# Patient Record
Sex: Female | Born: 1955
Health system: Southern US, Community
[De-identification: ages and names within clinical notes are randomized; demographics above are authoritative.]

## PROBLEM LIST (undated history)

## (undated) DIAGNOSIS — D62 Acute posthemorrhagic anemia: Secondary | ICD-10-CM

## (undated) DIAGNOSIS — E7800 Pure hypercholesterolemia, unspecified: Secondary | ICD-10-CM

## (undated) DIAGNOSIS — I252 Old myocardial infarction: Secondary | ICD-10-CM

## (undated) DIAGNOSIS — I10 Essential (primary) hypertension: Secondary | ICD-10-CM

## (undated) DIAGNOSIS — E8881 Metabolic syndrome: Secondary | ICD-10-CM

## (undated) DIAGNOSIS — M109 Gout, unspecified: Secondary | ICD-10-CM

## (undated) DIAGNOSIS — K219 Gastro-esophageal reflux disease without esophagitis: Secondary | ICD-10-CM

## (undated) DIAGNOSIS — I2510 Atherosclerotic heart disease of native coronary artery without angina pectoris: Secondary | ICD-10-CM

## (undated) DIAGNOSIS — I639 Cerebral infarction, unspecified: Secondary | ICD-10-CM

## (undated) DIAGNOSIS — M1711 Unilateral primary osteoarthritis, right knee: Principal | ICD-10-CM

## (undated) DIAGNOSIS — M1990 Unspecified osteoarthritis, unspecified site: Secondary | ICD-10-CM

## (undated) DIAGNOSIS — R7989 Other specified abnormal findings of blood chemistry: Secondary | ICD-10-CM

## (undated) DIAGNOSIS — I213 ST elevation (STEMI) myocardial infarction of unspecified site: Secondary | ICD-10-CM

## (undated) DIAGNOSIS — E039 Hypothyroidism, unspecified: Secondary | ICD-10-CM

## (undated) DIAGNOSIS — Z96659 Presence of unspecified artificial knee joint: Secondary | ICD-10-CM

## (undated) HISTORY — DX: Metabolic syndrome: E88.81

## (undated) HISTORY — DX: Gastro-esophageal reflux disease without esophagitis: K21.9

## (undated) HISTORY — DX: Acute posthemorrhagic anemia: D62

## (undated) HISTORY — DX: Metabolic syndrome: E88.810

## (undated) HISTORY — DX: Atherosclerotic heart disease of native coronary artery without angina pectoris: I25.10

## (undated) HISTORY — PX: JOINT REPLACEMENT: SHX530

## (undated) HISTORY — DX: Presence of unspecified artificial knee joint: Z96.659

## (undated) HISTORY — PX: SHOULDER ARTHROSCOPY WITH OPEN ROTATOR CUFF REPAIR: SHX6092

## (undated) HISTORY — DX: Other specified abnormal findings of blood chemistry: R79.89

## (undated) HISTORY — PX: LAPAROSCOPIC CHOLECYSTECTOMY: SUR755

## (undated) HISTORY — DX: Unilateral primary osteoarthritis, right knee: M17.11

## (undated) HISTORY — PX: TUBAL LIGATION: SHX77

## (undated) HISTORY — DX: Old myocardial infarction: I25.2

---

## 1998-11-13 ENCOUNTER — Emergency Department (HOSPITAL_COMMUNITY): Admission: EM | Admit: 1998-11-13 | Discharge: 1998-11-13 | Payer: Self-pay | Admitting: Emergency Medicine

## 1999-03-28 ENCOUNTER — Emergency Department (HOSPITAL_COMMUNITY): Admission: EM | Admit: 1999-03-28 | Discharge: 1999-03-28 | Payer: Self-pay | Admitting: Emergency Medicine

## 1999-03-28 ENCOUNTER — Encounter: Payer: Self-pay | Admitting: Emergency Medicine

## 2000-08-23 ENCOUNTER — Encounter: Admission: RE | Admit: 2000-08-23 | Discharge: 2000-08-23 | Payer: Self-pay | Admitting: Internal Medicine

## 2000-08-23 ENCOUNTER — Encounter: Payer: Self-pay | Admitting: Internal Medicine

## 2000-09-27 ENCOUNTER — Other Ambulatory Visit: Admission: RE | Admit: 2000-09-27 | Discharge: 2000-09-27 | Payer: Self-pay | Admitting: Internal Medicine

## 2002-02-18 ENCOUNTER — Other Ambulatory Visit: Admission: RE | Admit: 2002-02-18 | Discharge: 2002-02-18 | Payer: Self-pay | Admitting: Internal Medicine

## 2002-03-18 ENCOUNTER — Emergency Department (HOSPITAL_COMMUNITY): Admission: EM | Admit: 2002-03-18 | Discharge: 2002-03-18 | Payer: Self-pay | Admitting: Emergency Medicine

## 2002-03-18 ENCOUNTER — Encounter: Payer: Self-pay | Admitting: Emergency Medicine

## 2003-03-22 ENCOUNTER — Emergency Department (HOSPITAL_COMMUNITY): Admission: EM | Admit: 2003-03-22 | Discharge: 2003-03-22 | Payer: Self-pay | Admitting: Emergency Medicine

## 2003-03-26 ENCOUNTER — Encounter: Payer: Self-pay | Admitting: Emergency Medicine

## 2003-03-26 ENCOUNTER — Emergency Department (HOSPITAL_COMMUNITY): Admission: EM | Admit: 2003-03-26 | Discharge: 2003-03-26 | Payer: Self-pay | Admitting: Emergency Medicine

## 2004-01-31 ENCOUNTER — Emergency Department (HOSPITAL_COMMUNITY): Admission: EM | Admit: 2004-01-31 | Discharge: 2004-02-01 | Payer: Self-pay | Admitting: Emergency Medicine

## 2004-02-01 IMAGING — CR DG SHOULDER 2+V*L*
3 series · 3 of 3 positions shown · non-contrast
Comparison: none

CLINICAL DATA: Left shoulder pain.  
 LEFT SHOULDER ? [DATE]

[view not recorded (1 of 3)]
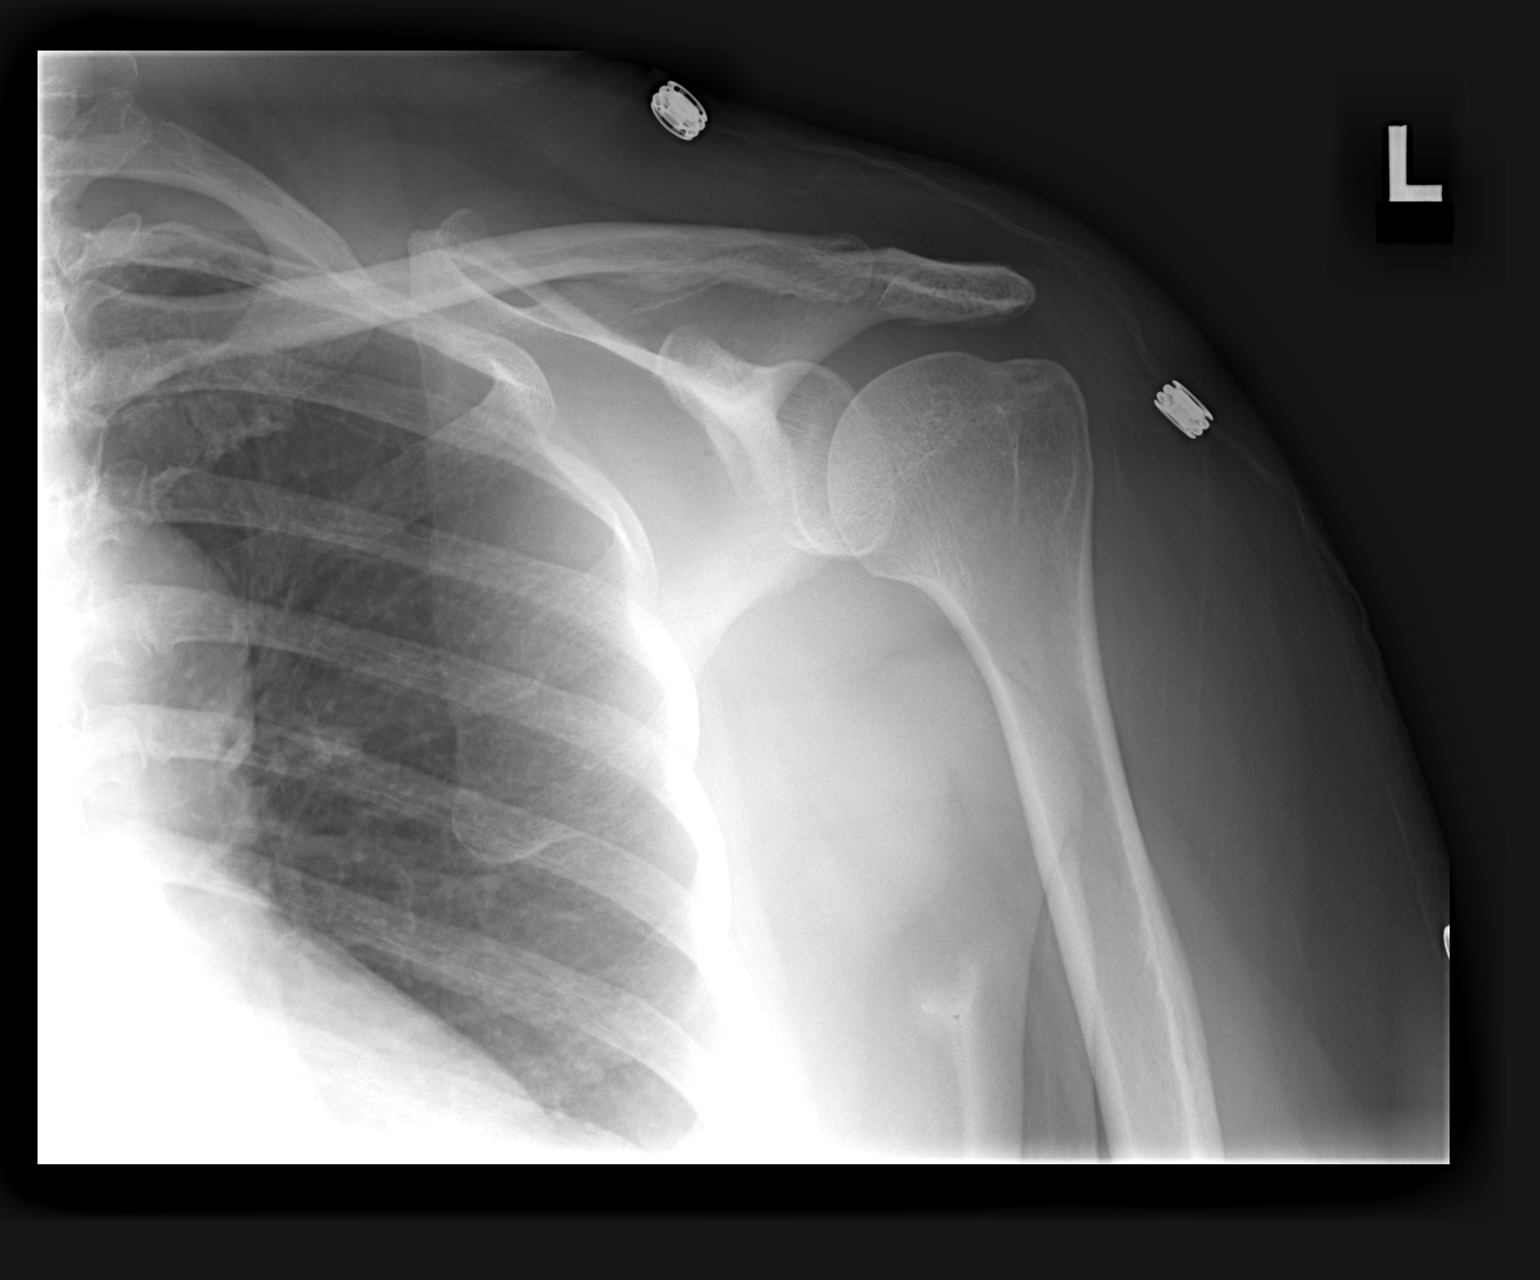

[view not recorded (2 of 3)]
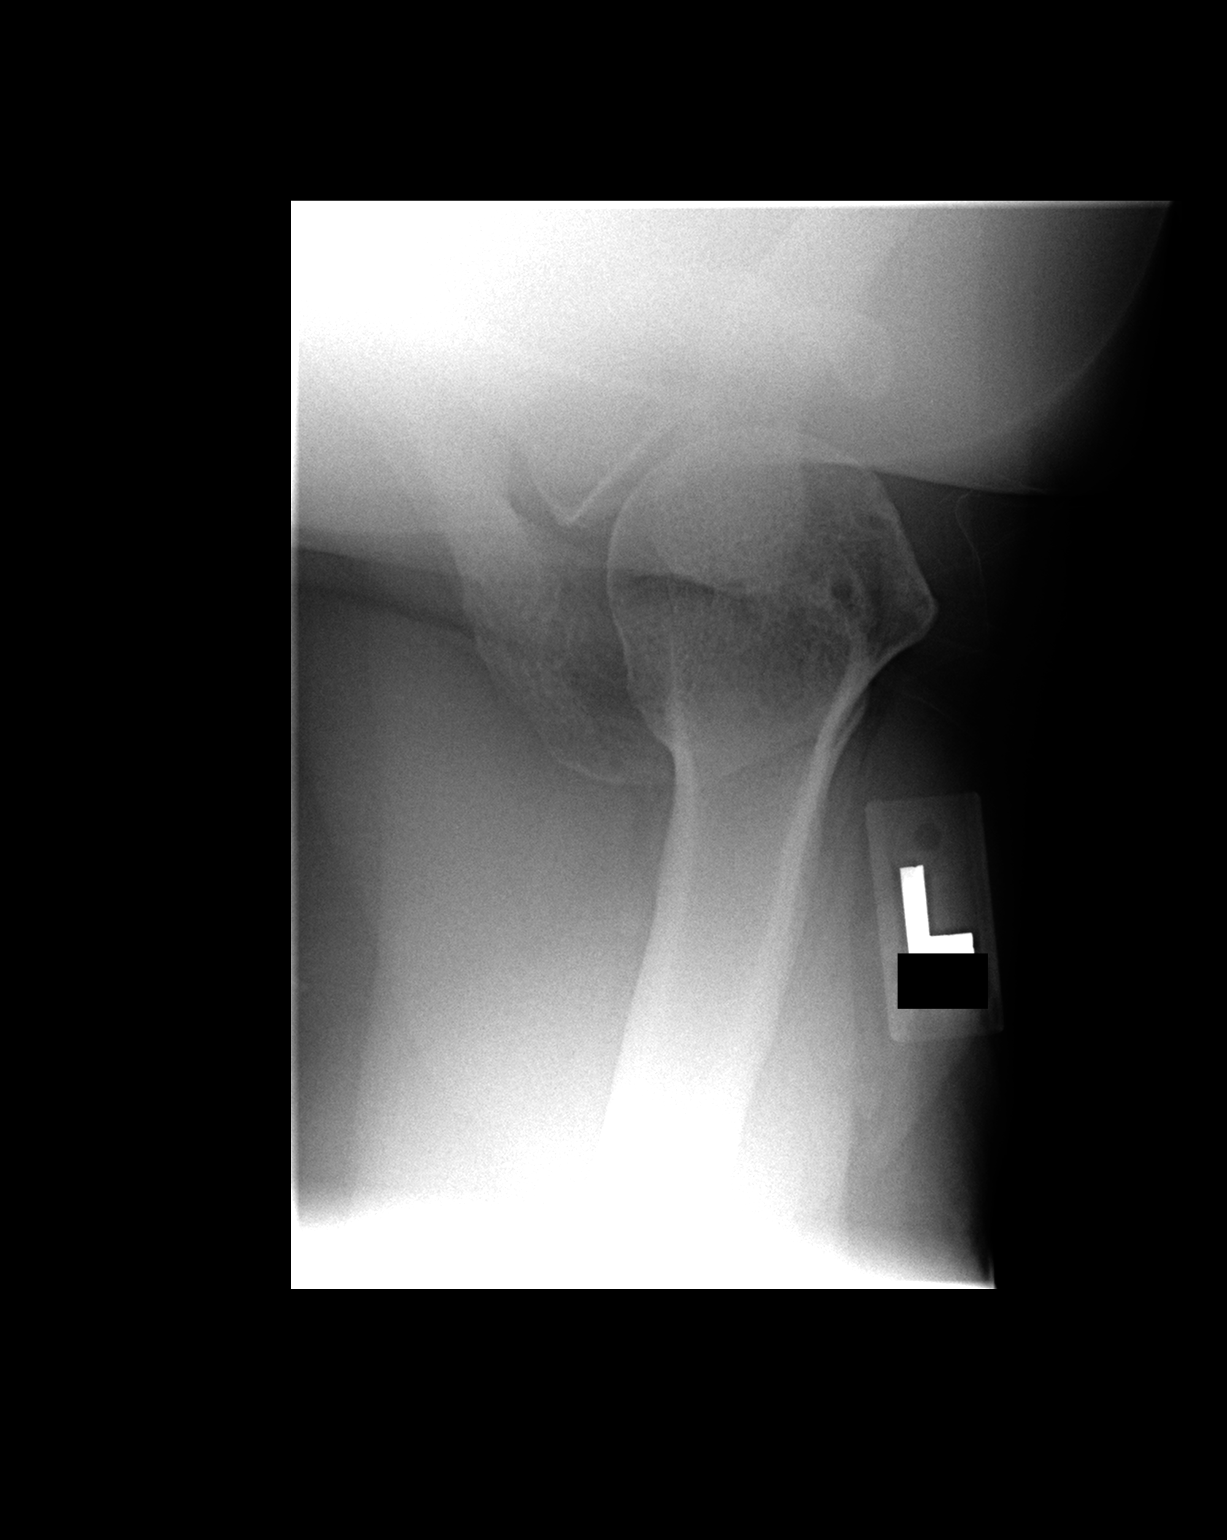

[view not recorded (3 of 3)]
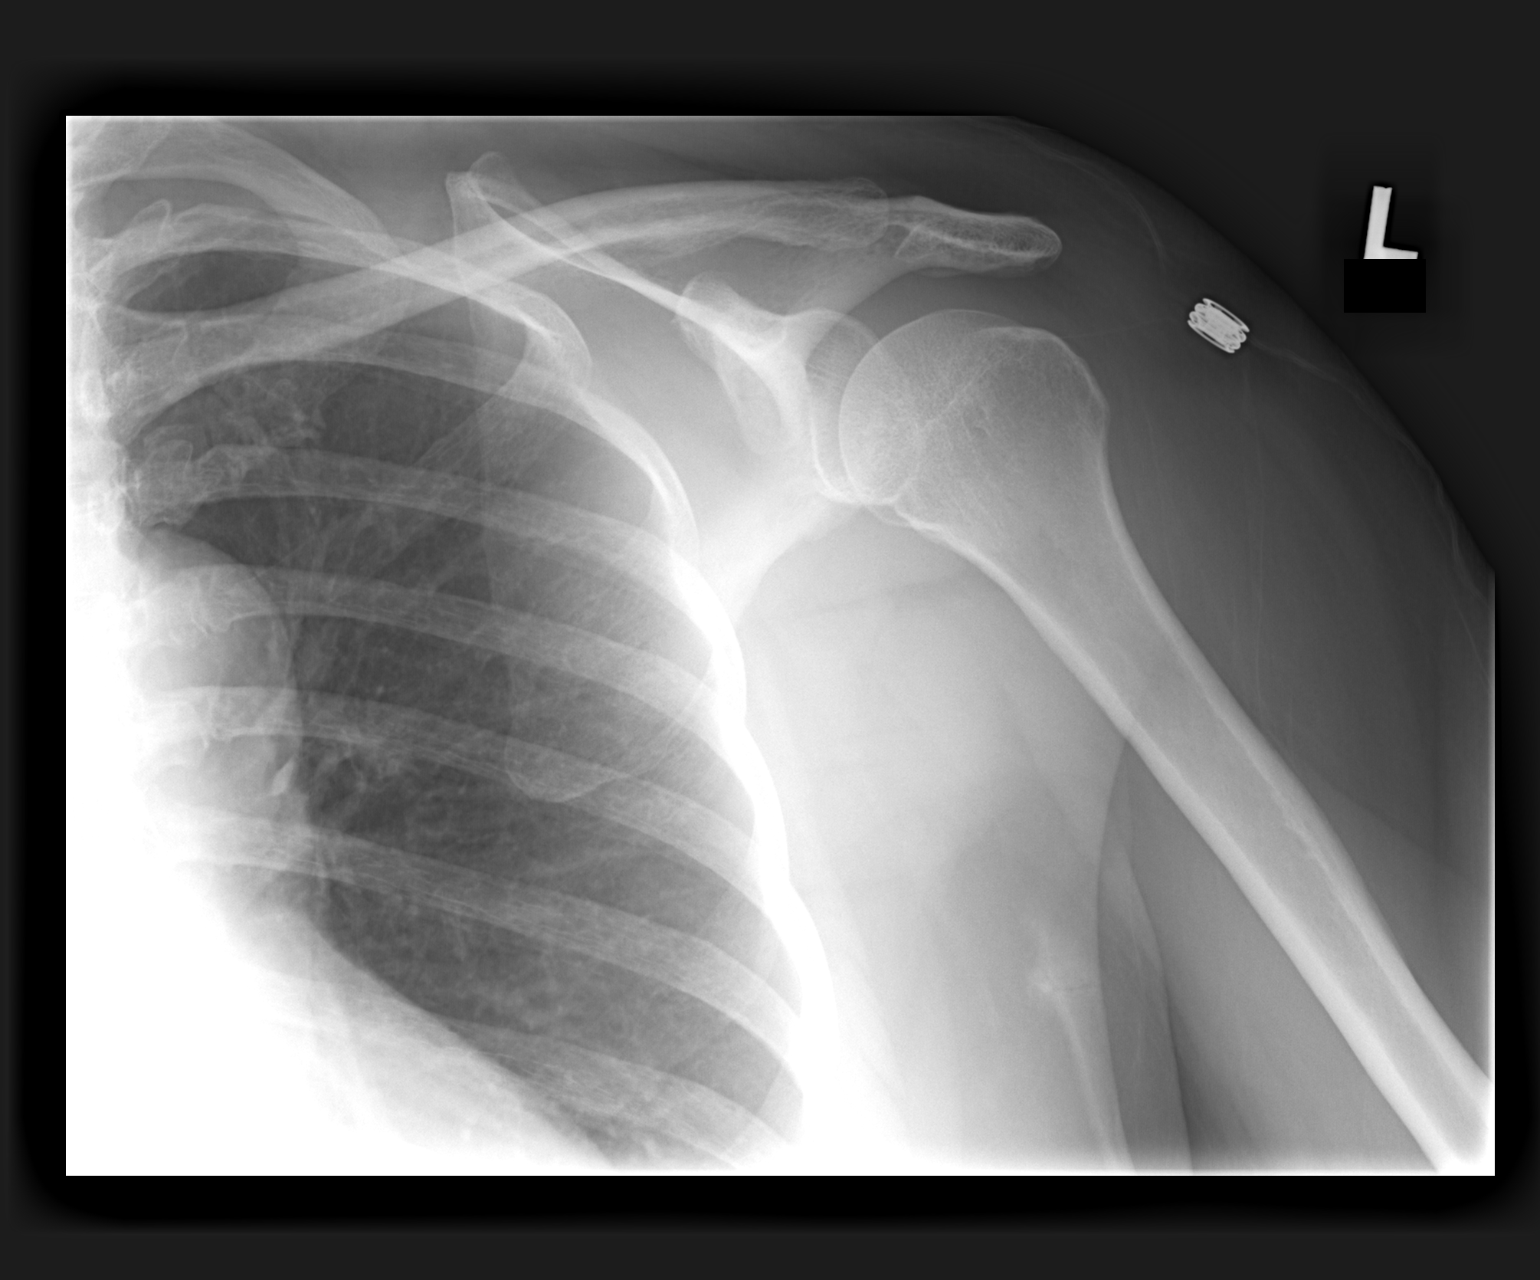

[3 of 3 positions shown; findings below may reference images not displayed]

FINDINGS: There is no evidence of fracture or dislocation. No other significant bone or soft tissue abnormalities are identified.

 IMPRESSION
 Normal study.

## 2004-12-15 ENCOUNTER — Encounter: Admission: RE | Admit: 2004-12-15 | Discharge: 2004-12-15 | Payer: Self-pay | Admitting: Internal Medicine

## 2004-12-15 IMAGING — CR DG LUMBAR SPINE COMPLETE 4+V
5 series · 5 of 5 positions shown · non-contrast
Comparison: none

CLINICAL DATA: Low back, right leg pain.  No known injury.
 DIAGNOSTIC LUMBAR SPINE COMPLETE:

[view not recorded (1 of 5)]
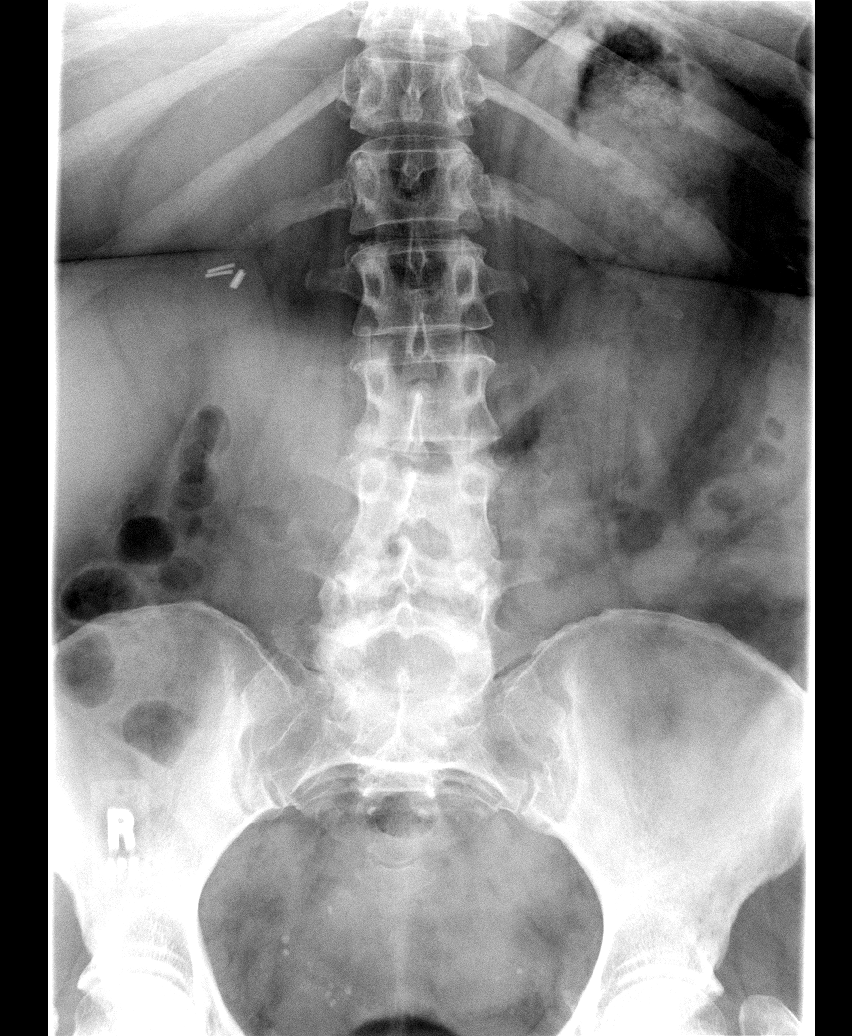

[view not recorded (2 of 5)]
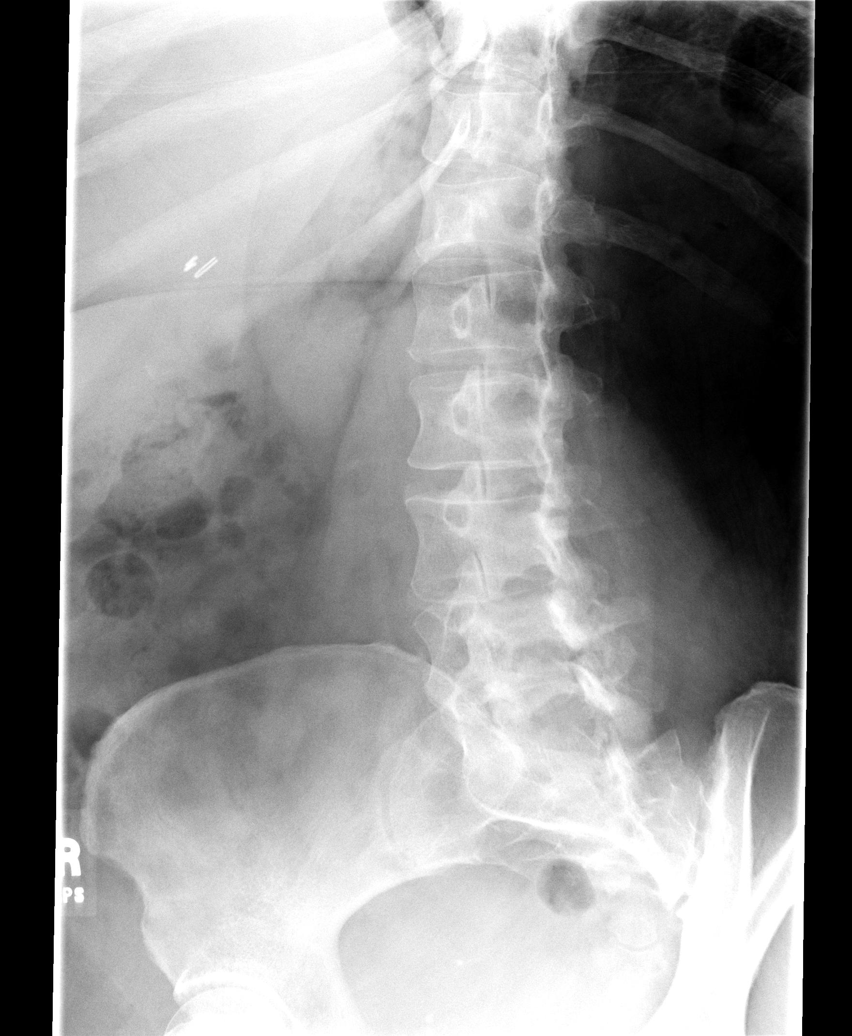

[view not recorded (3 of 5)]
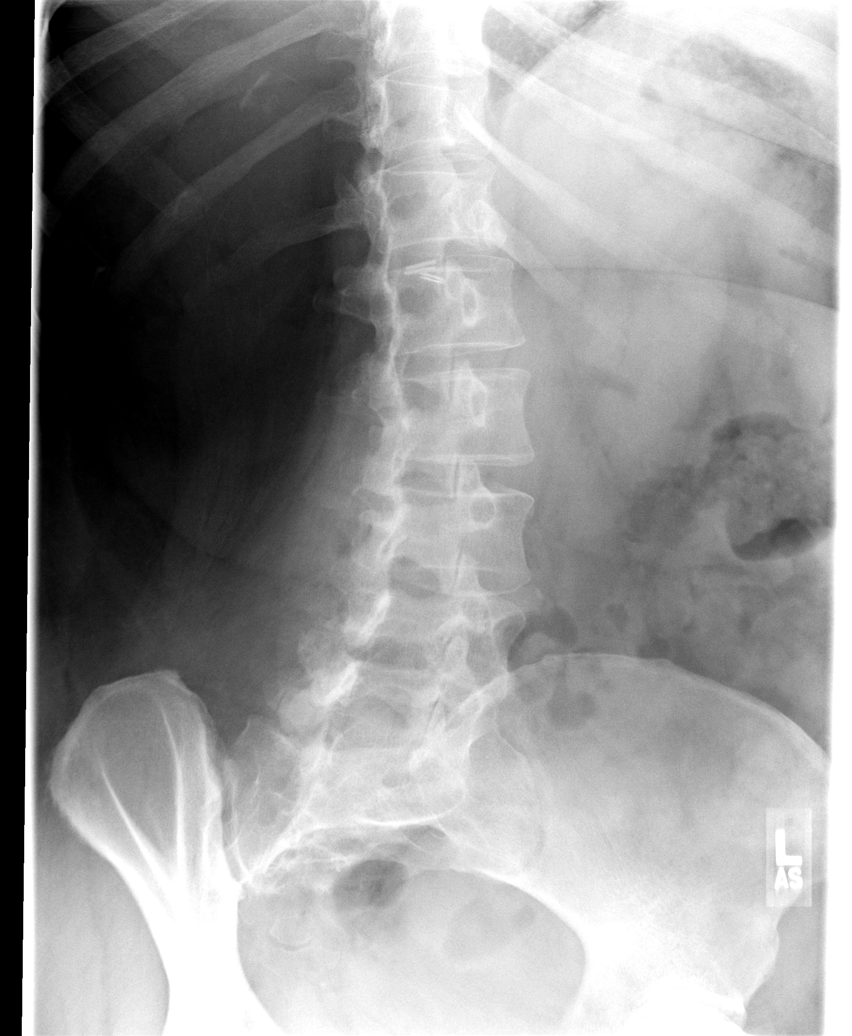

[view not recorded (4 of 5)]
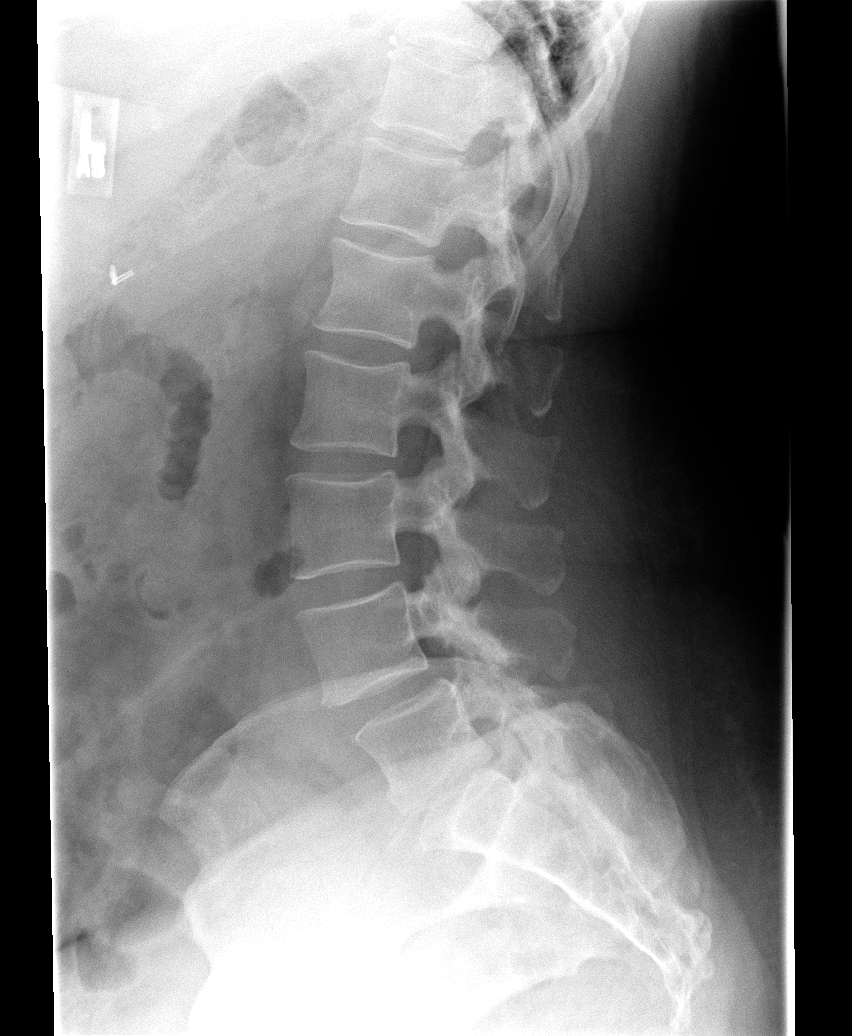

[view not recorded (5 of 5)]
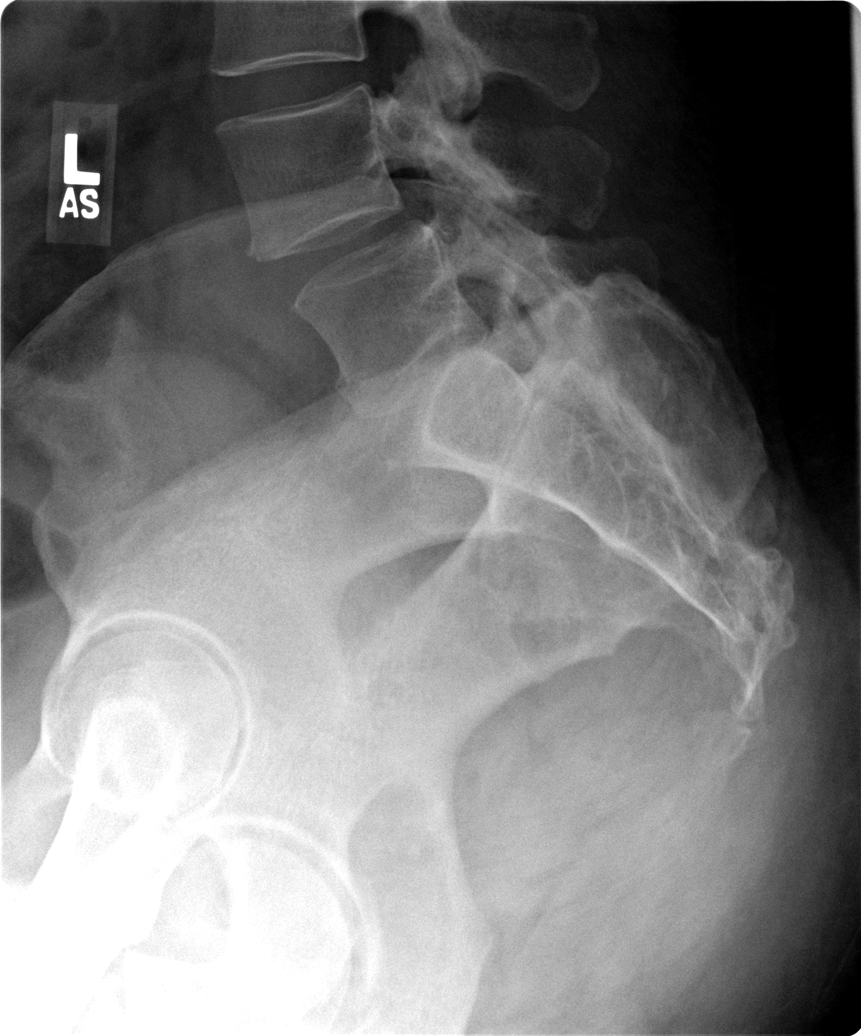

[5 of 5 positions shown; findings below may reference images not displayed]

FINDINGS: Five non-rib bearing lumbar vertebrae are seen with slight levorotary scoliosis lumbar spine.  4 mm minimal degenerative anterolisthesis is seen at L4-5.  Either developmentally smaller and/or degenerative disc space narrowing is seen at L5-S1 with the remaining lumbar disc spaces normally maintained.  Slight degenerative facet change is seen at the right greater than left L4-5 and lesser bilateral L5-S1 facet joints.
IMPRESSION: 1.  Slight levorotary scoliosis.
 2.  4 mm degenerative anterolisthesis L4-5 with right greater than left facet degenerative joint disease L4-5 and lesser facet degenerative changes L5-S1.
 3.  Smaller L5-S1 disc as described. 
 4.  Otherwise negative.

## 2005-10-01 ENCOUNTER — Emergency Department (HOSPITAL_COMMUNITY): Admission: EM | Admit: 2005-10-01 | Discharge: 2005-10-01 | Payer: Self-pay | Admitting: Emergency Medicine

## 2005-10-01 IMAGING — CR DG ABDOMEN ACUTE W/ 1V CHEST
3 series · 3 of 3 positions shown · non-contrast
Comparison: none

CLINICAL DATA: Abdominal pain.  
 ACUTE ABDOMINAL SERIES:

[w chest pa]
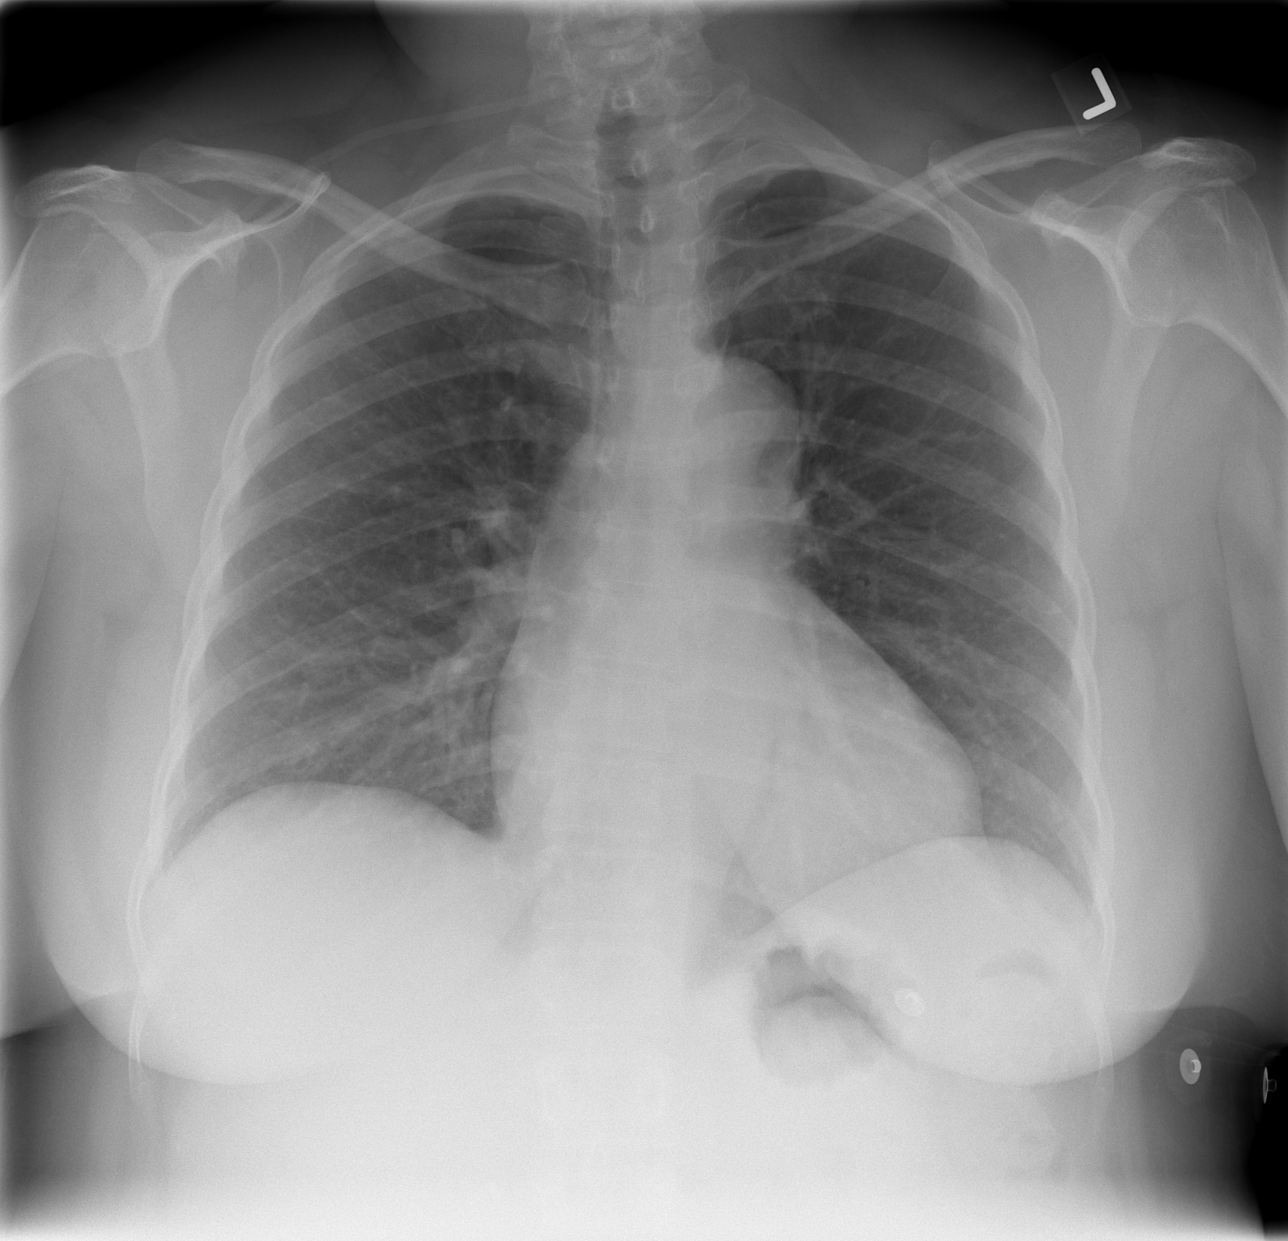

[w abdomen upright *]
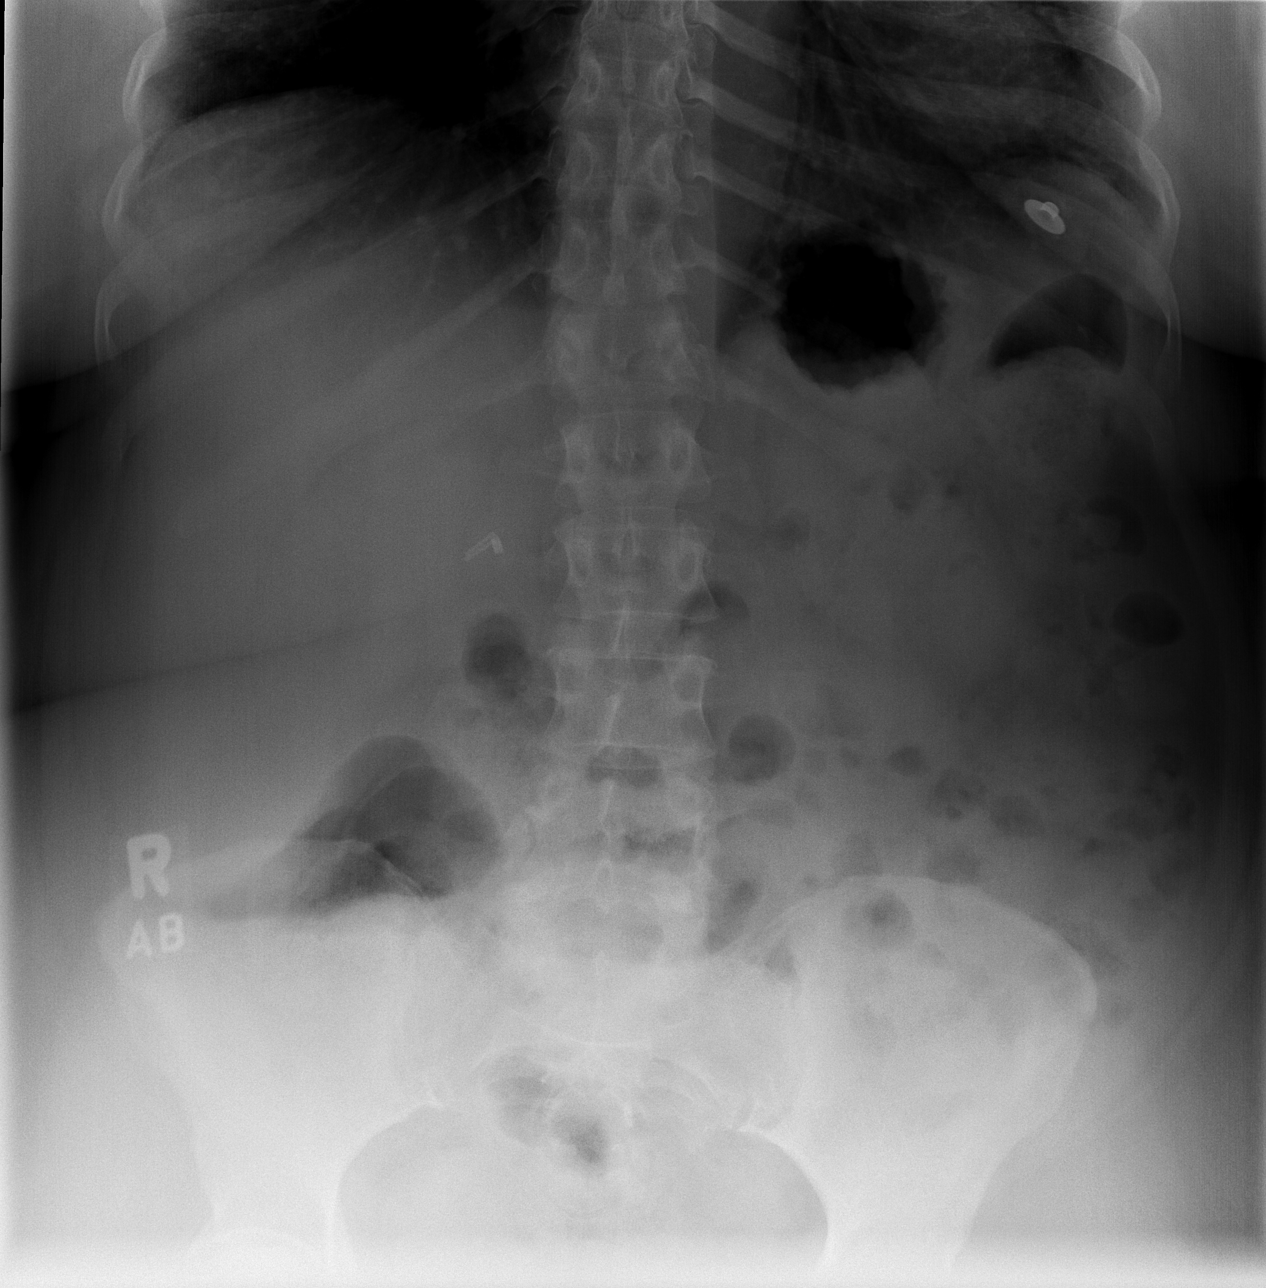

[t abdomen supine]
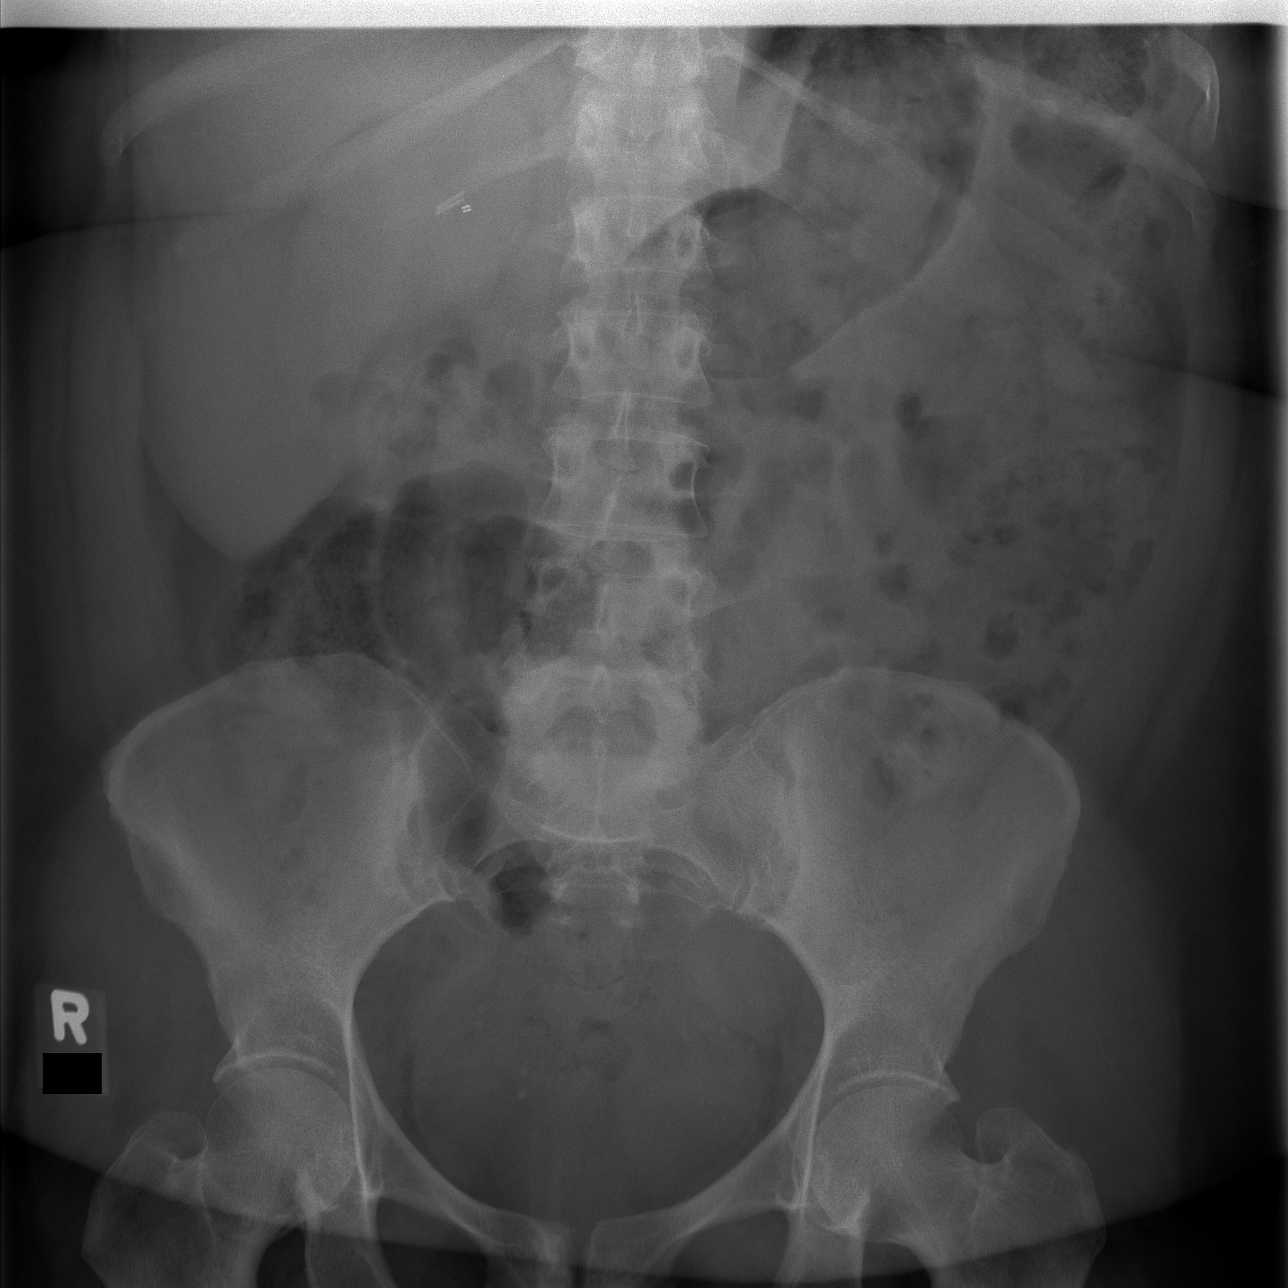

[3 of 3 positions shown; findings below may reference images not displayed]

FINDINGS: The lungs are clear.  Cardiac and mediastinal contours are normal. 
 Nonobstructive bowel gas.  Surgical clips right upper quadrant attributed to prior cholecystectomy.  Calcification within the pelvis attributed to phleboliths.
IMPRESSION: 1.  Negative for acute cardiac or pulmonary process.
 2.  Nonobstructive bowel gas.

## 2005-10-12 ENCOUNTER — Other Ambulatory Visit: Admission: RE | Admit: 2005-10-12 | Discharge: 2005-10-12 | Payer: Self-pay | Admitting: Obstetrics and Gynecology

## 2005-12-29 ENCOUNTER — Ambulatory Visit (HOSPITAL_COMMUNITY): Admission: RE | Admit: 2005-12-29 | Discharge: 2005-12-29 | Payer: Self-pay | Admitting: Obstetrics and Gynecology

## 2007-08-16 ENCOUNTER — Emergency Department (HOSPITAL_COMMUNITY): Admission: EM | Admit: 2007-08-16 | Discharge: 2007-08-16 | Payer: Self-pay | Admitting: Emergency Medicine

## 2007-08-16 ENCOUNTER — Ambulatory Visit: Payer: Self-pay | Admitting: Vascular Surgery

## 2008-10-19 ENCOUNTER — Emergency Department (HOSPITAL_COMMUNITY): Admission: EM | Admit: 2008-10-19 | Discharge: 2008-10-19 | Payer: Self-pay | Admitting: Emergency Medicine

## 2008-10-19 IMAGING — CR DG HIP COMPLETE 2+V*R*
3 series · 3 of 3 positions shown · non-contrast
Comparison: None available

CLINICAL DATA: Right leg pain.

RIGHT HIP - COMPLETE 2+ VIEW

[t pelvis a.p.]
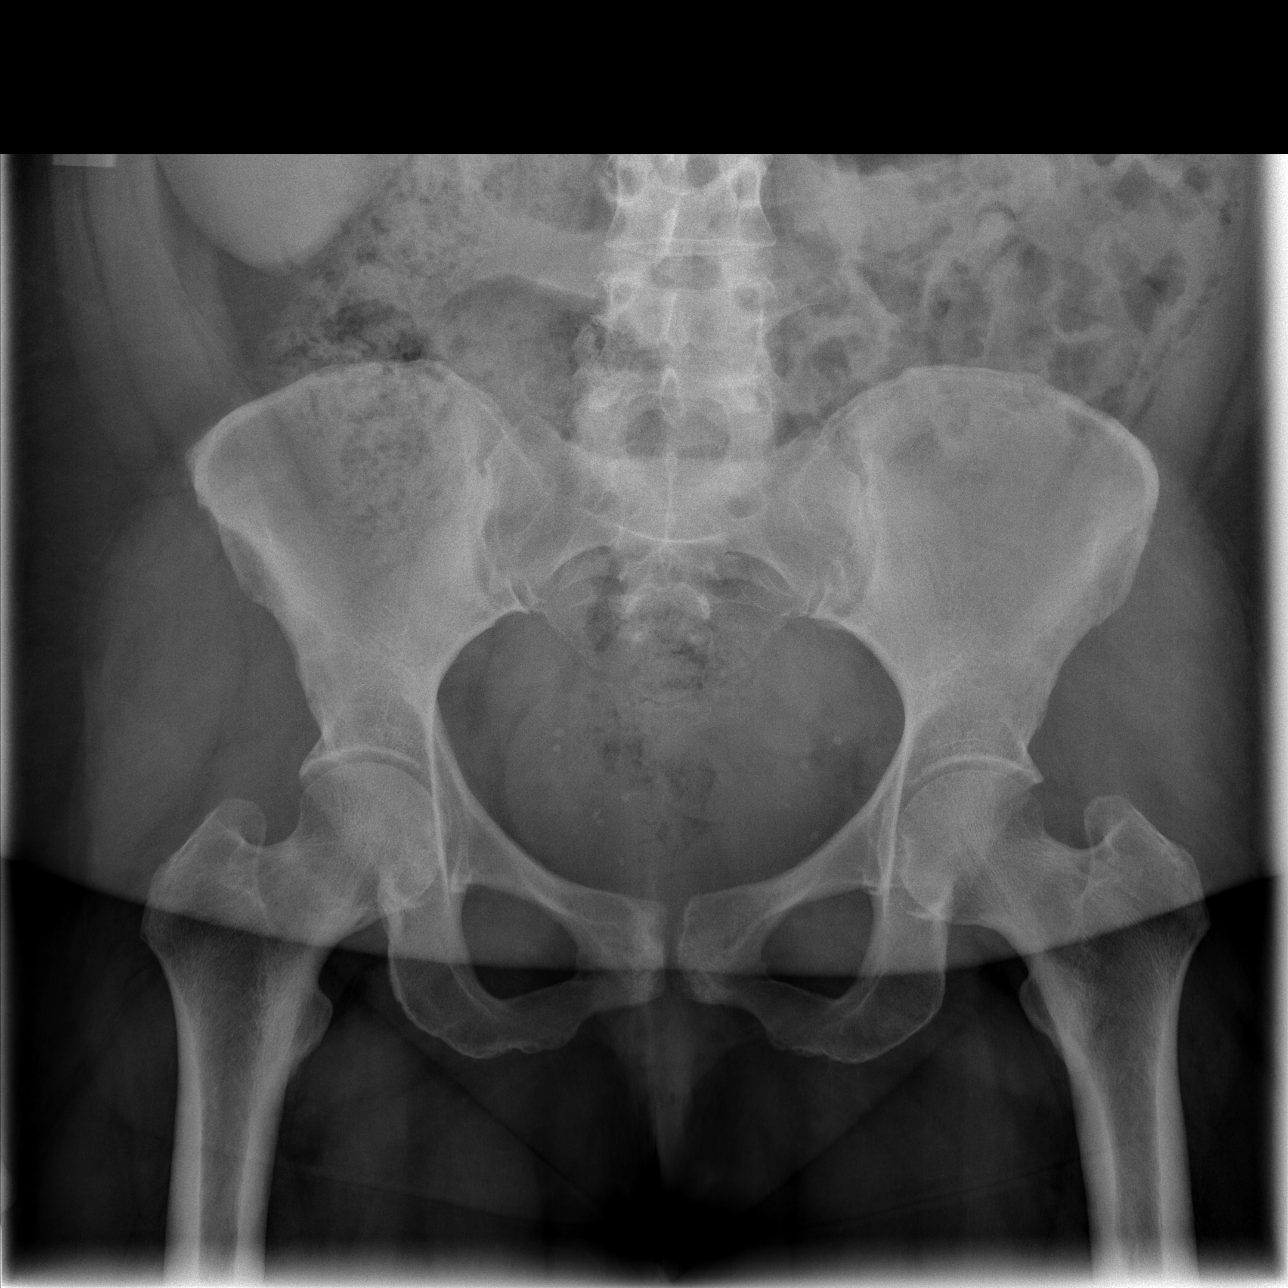

[t hip ap right]
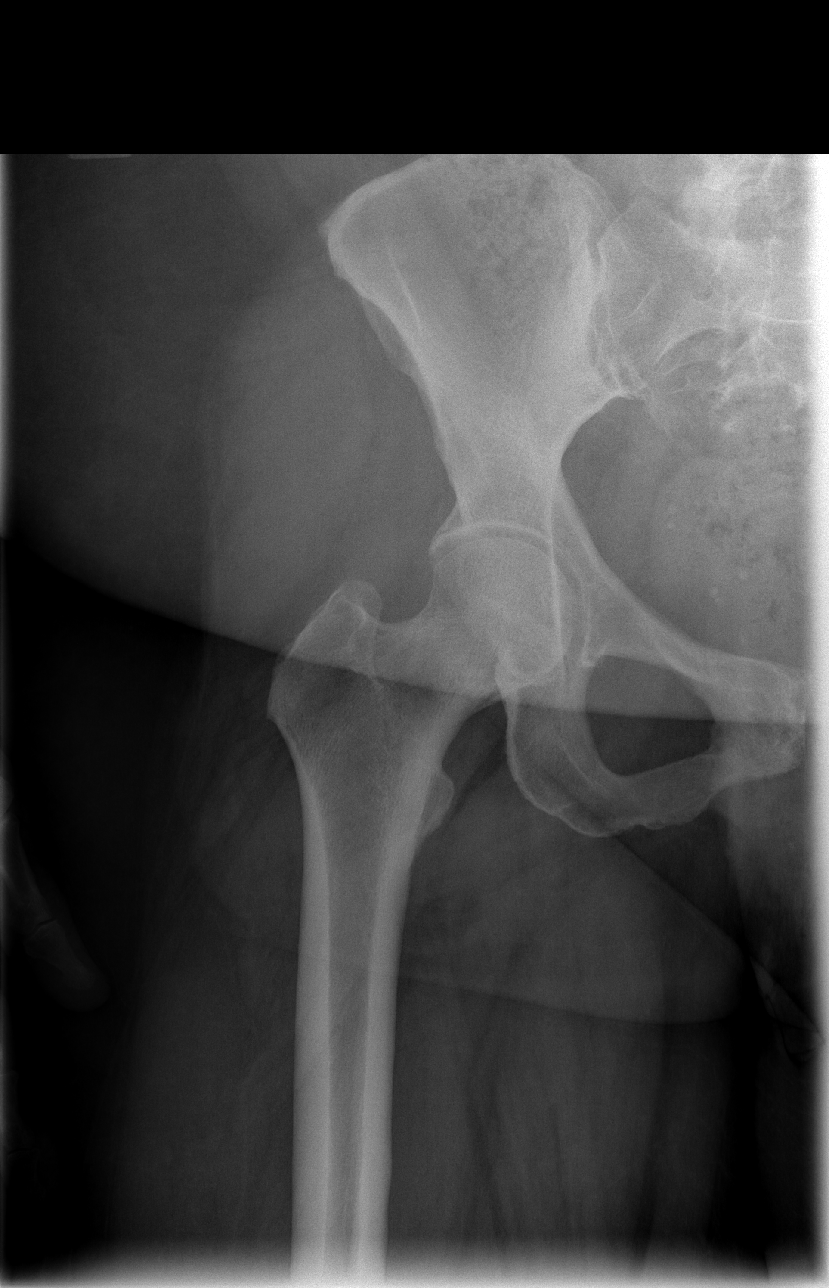

[t hip frog leg right]
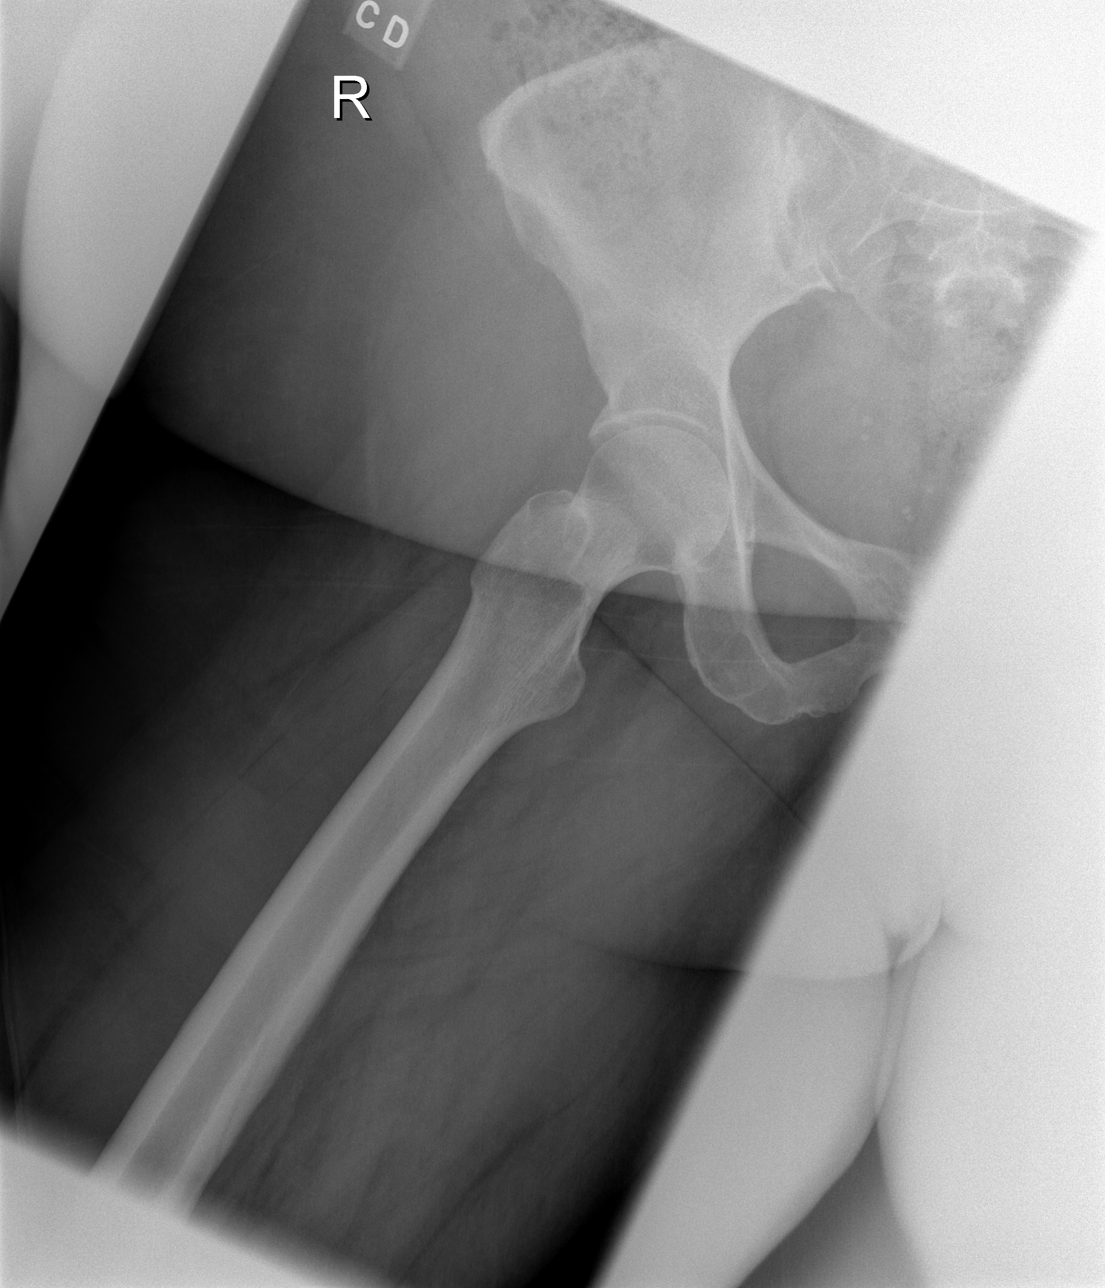

[3 of 3 positions shown; findings below may reference images not displayed]

FINDINGS: Hip joint spaces appear symmetric bilaterally.  No
fracture is present.  Calcified phleboliths present in the anatomic
pelvis.  Lateral view of the right hip unremarkable.
IMPRESSION: No acute osseous abnormality.  If the the patient continues to have
hip pain, consider MRI.

## 2010-12-24 ENCOUNTER — Encounter: Payer: Self-pay | Admitting: Obstetrics and Gynecology

## 2011-05-06 ENCOUNTER — Emergency Department (HOSPITAL_COMMUNITY)
Admission: EM | Admit: 2011-05-06 | Discharge: 2011-05-06 | Disposition: A | Discharge status: Home / Self Care | Payer: Self-pay | Attending: Emergency Medicine | Admitting: Emergency Medicine

## 2011-05-06 DIAGNOSIS — E039 Hypothyroidism, unspecified: Secondary | ICD-10-CM | POA: Insufficient documentation

## 2011-05-06 DIAGNOSIS — L0211 Cutaneous abscess of neck: Secondary | ICD-10-CM | POA: Insufficient documentation

## 2011-05-06 DIAGNOSIS — R21 Rash and other nonspecific skin eruption: Secondary | ICD-10-CM | POA: Insufficient documentation

## 2011-05-06 DIAGNOSIS — L259 Unspecified contact dermatitis, unspecified cause: Secondary | ICD-10-CM | POA: Insufficient documentation

## 2011-05-06 DIAGNOSIS — IMO0002 Reserved for concepts with insufficient information to code with codable children: Secondary | ICD-10-CM | POA: Insufficient documentation

## 2011-09-15 LAB — POCT I-STAT CREATININE: Creatinine, Ser: 0.9

## 2011-09-15 LAB — I-STAT 8, (EC8 V) (CONVERTED LAB)
Acid-Base Excess: 2
Chloride: 105
HCT: 39
Operator id: 288331
Potassium: 3.5
Sodium: 139

## 2011-09-15 LAB — DIFFERENTIAL
Basophils Relative: 1
Eosinophils Absolute: 0.1
Eosinophils Relative: 2
Monocytes Absolute: 0.3
Monocytes Relative: 5

## 2011-09-15 LAB — CBC
Hemoglobin: 11.7 — ABNORMAL LOW
MCHC: 33.8
MCV: 98.1
RBC: 3.54 — ABNORMAL LOW

## 2013-06-13 ENCOUNTER — Other Ambulatory Visit (INDEPENDENT_AMBULATORY_CARE_PROVIDER_SITE_OTHER): Payer: Self-pay | Admitting: General Surgery

## 2013-06-13 DIAGNOSIS — C50911 Malignant neoplasm of unspecified site of right female breast: Secondary | ICD-10-CM

## 2013-06-13 NOTE — Addendum Note (Signed)
Addended by: Milas Hock on: 06/13/2013 11:23 AM   Modules accepted: Orders

## 2013-09-01 ENCOUNTER — Encounter (INDEPENDENT_AMBULATORY_CARE_PROVIDER_SITE_OTHER): Payer: Self-pay

## 2013-11-18 ENCOUNTER — Ambulatory Visit: Payer: Self-pay | Admitting: Family Medicine

## 2013-11-25 ENCOUNTER — Ambulatory Visit: Payer: Self-pay | Admitting: Family Medicine

## 2014-05-29 ENCOUNTER — Encounter (HOSPITAL_COMMUNITY): Payer: Self-pay | Admitting: Emergency Medicine

## 2014-05-29 ENCOUNTER — Emergency Department (HOSPITAL_COMMUNITY)
Admission: EM | Admit: 2014-05-29 | Discharge: 2014-05-29 | Disposition: A | Discharge status: Home / Self Care | Payer: No Typology Code available for payment source | Attending: Emergency Medicine | Admitting: Emergency Medicine

## 2014-05-29 ENCOUNTER — Emergency Department (HOSPITAL_COMMUNITY): Payer: No Typology Code available for payment source

## 2014-05-29 DIAGNOSIS — R079 Chest pain, unspecified: Secondary | ICD-10-CM | POA: Insufficient documentation

## 2014-05-29 DIAGNOSIS — Z791 Long term (current) use of non-steroidal anti-inflammatories (NSAID): Secondary | ICD-10-CM | POA: Insufficient documentation

## 2014-05-29 DIAGNOSIS — H109 Unspecified conjunctivitis: Secondary | ICD-10-CM | POA: Insufficient documentation

## 2014-05-29 DIAGNOSIS — I10 Essential (primary) hypertension: Secondary | ICD-10-CM | POA: Insufficient documentation

## 2014-05-29 DIAGNOSIS — Z8673 Personal history of transient ischemic attack (TIA), and cerebral infarction without residual deficits: Secondary | ICD-10-CM | POA: Insufficient documentation

## 2014-05-29 DIAGNOSIS — E663 Overweight: Secondary | ICD-10-CM | POA: Insufficient documentation

## 2014-05-29 HISTORY — DX: Cerebral infarction, unspecified: I63.9

## 2014-05-29 HISTORY — DX: Essential (primary) hypertension: I10

## 2014-05-29 LAB — COMPREHENSIVE METABOLIC PANEL
ALBUMIN: 3.9 g/dL (ref 3.5–5.2)
ALK PHOS: 84 U/L (ref 39–117)
ALT: 19 U/L (ref 0–35)
AST: 41 U/L — ABNORMAL HIGH (ref 0–37)
BILIRUBIN TOTAL: 0.2 mg/dL — AB (ref 0.3–1.2)
BUN: 19 mg/dL (ref 6–23)
CHLORIDE: 98 meq/L (ref 96–112)
CO2: 28 meq/L (ref 19–32)
Calcium: 9.7 mg/dL (ref 8.4–10.5)
Creatinine, Ser: 1.17 mg/dL — ABNORMAL HIGH (ref 0.50–1.10)
GFR calc Af Amer: 57 mL/min — ABNORMAL LOW (ref >90)
GFR, EST NON AFRICAN AMERICAN: 50 mL/min — AB (ref >90)
GLUCOSE: 96 mg/dL (ref 70–99)
POTASSIUM: 3.8 meq/L (ref 3.7–5.3)
Sodium: 140 mEq/L (ref 137–147)
Total Protein: 7.8 g/dL (ref 6.0–8.3)

## 2014-05-29 LAB — URINALYSIS, ROUTINE W REFLEX MICROSCOPIC
BILIRUBIN URINE: NEGATIVE
Glucose, UA: NEGATIVE mg/dL
KETONES UR: NEGATIVE mg/dL
NITRITE: NEGATIVE
Protein, ur: NEGATIVE mg/dL
SPECIFIC GRAVITY, URINE: 1.017 (ref 1.005–1.030)
UROBILINOGEN UA: 0.2 mg/dL (ref 0.0–1.0)
pH: 5.5 (ref 5.0–8.0)

## 2014-05-29 LAB — CBC
HCT: 33.7 % — ABNORMAL LOW (ref 36.0–46.0)
Hemoglobin: 11 g/dL — ABNORMAL LOW (ref 12.0–15.0)
MCH: 32.7 pg (ref 26.0–34.0)
MCHC: 32.6 g/dL (ref 30.0–36.0)
MCV: 100.3 fL — AB (ref 78.0–100.0)
PLATELETS: 310 10*3/uL (ref 150–400)
RBC: 3.36 MIL/uL — ABNORMAL LOW (ref 3.87–5.11)
RDW: 14.6 % (ref 11.5–15.5)
WBC: 4 10*3/uL (ref 4.0–10.5)

## 2014-05-29 LAB — URINE MICROSCOPIC-ADD ON

## 2014-05-29 LAB — I-STAT TROPONIN, ED: Troponin i, poc: 0.02 ng/mL (ref 0.00–0.08)

## 2014-05-29 IMAGING — CR DG CHEST 2V
2 series · 2 of 2 positions shown · non-contrast
Comparison: None.

CLINICAL DATA: Substernal chest pain radiating to the back.
Shortness of breath with exertion.

EXAM:
CHEST  2 VIEW

[w chest pa]
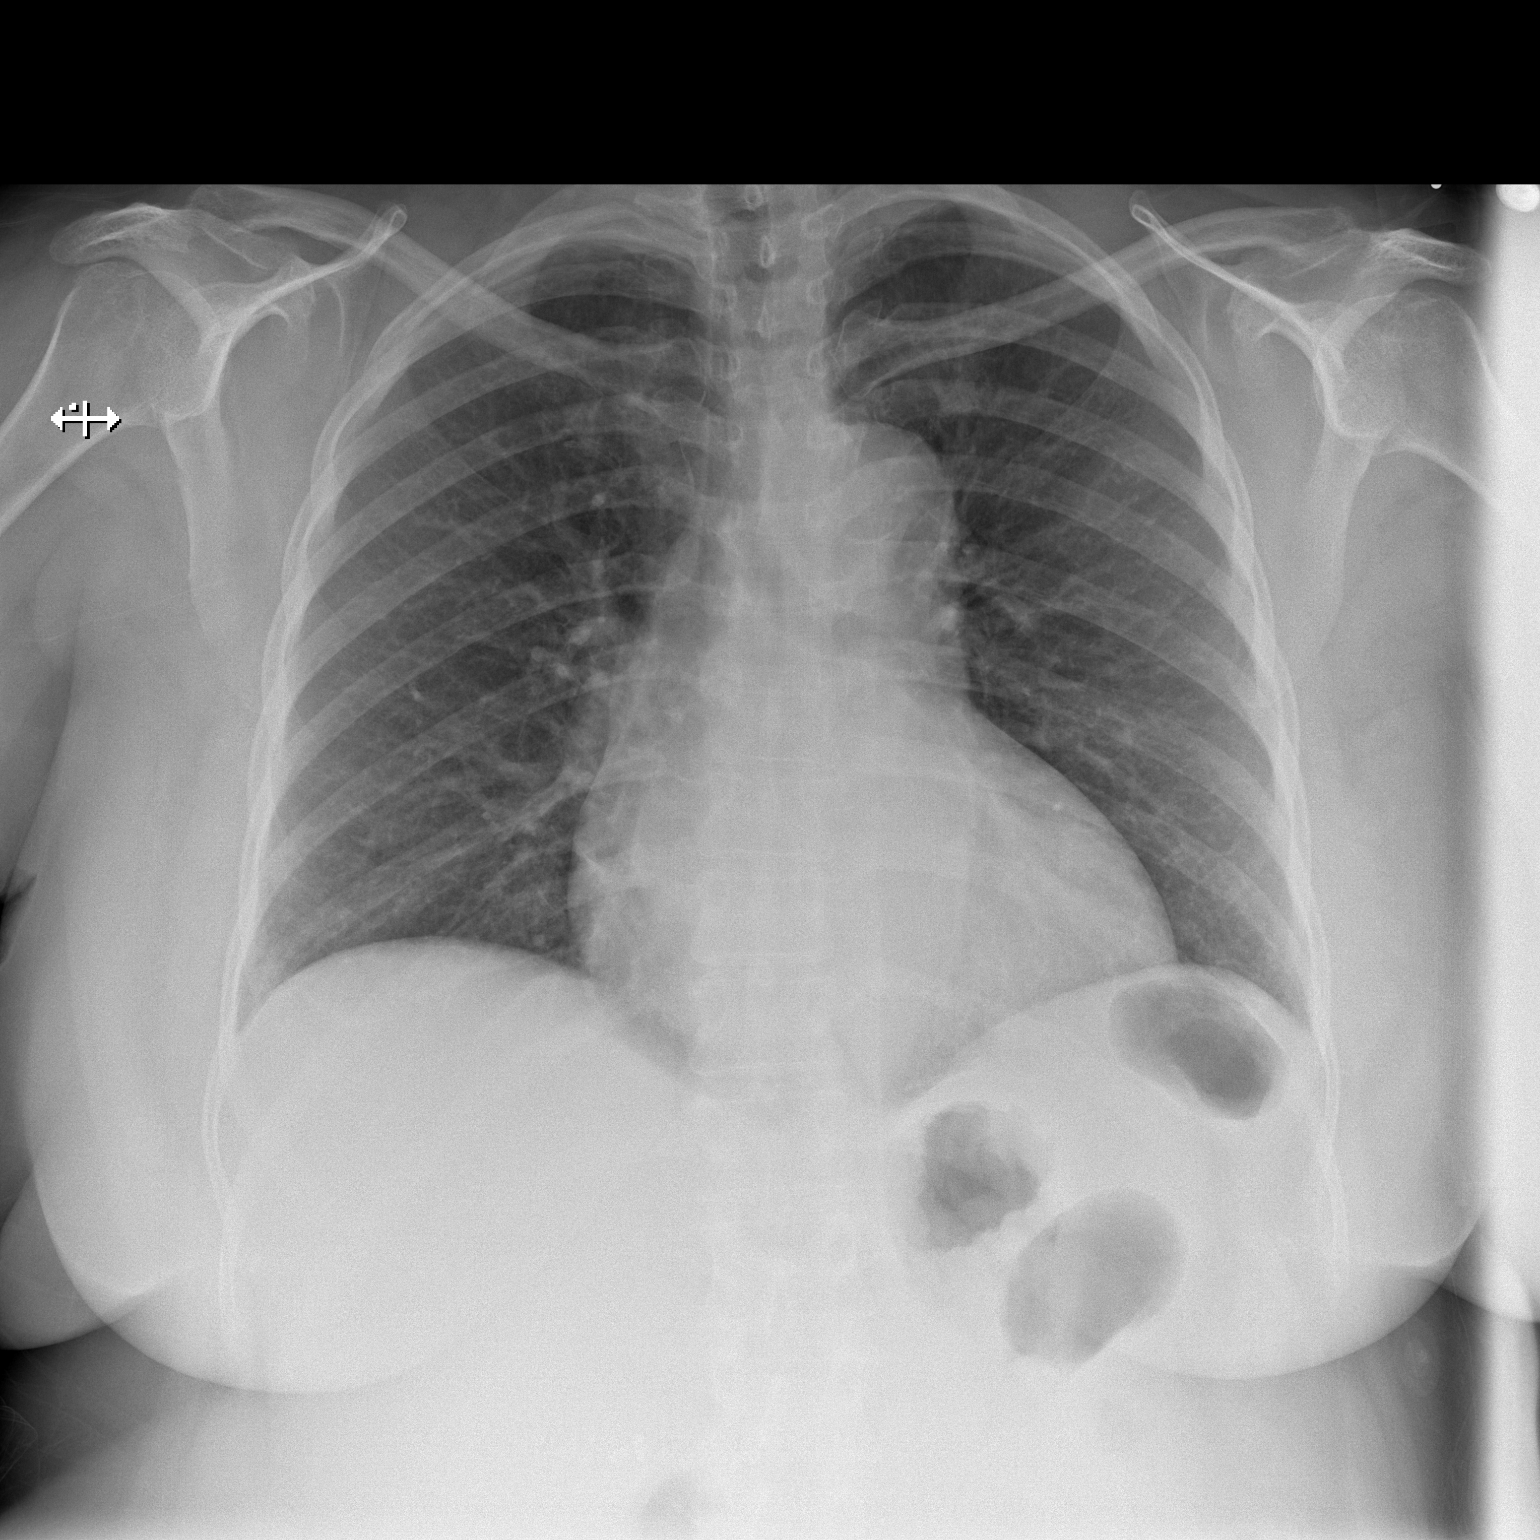

[w chest lat]
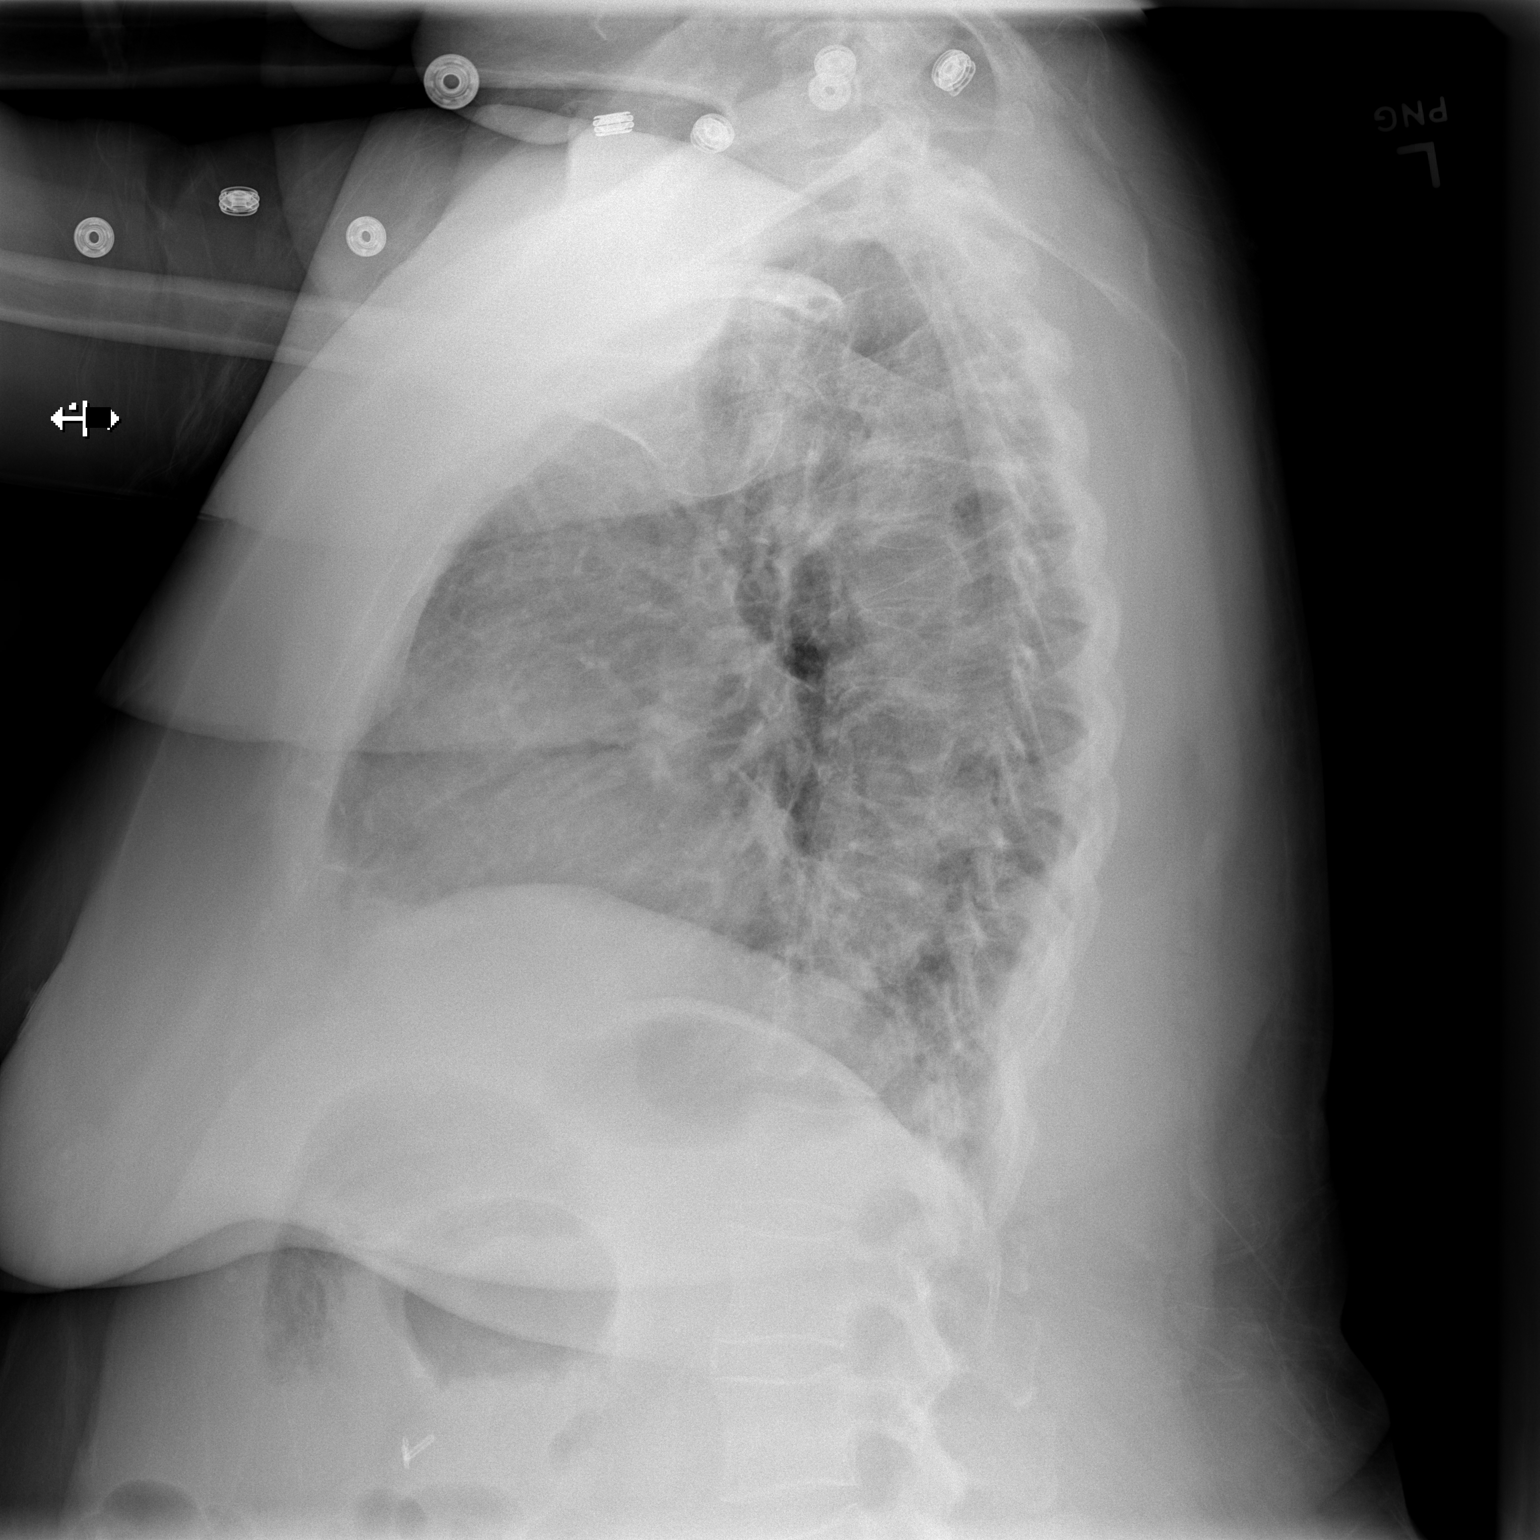

[2 of 2 positions shown; findings below may reference images not displayed]

FINDINGS: Trachea is midline. Heart is at the upper limits of normal in size
to mildly enlarged. Mild biapical pleural thickening. Lungs are
clear. No pleural fluid.
IMPRESSION: No acute findings.

## 2014-05-29 MED ORDER — GI COCKTAIL ~~LOC~~
30.0000 mL | Freq: Once | ORAL | Status: AC
  Administered 2014-05-29: 30 mL via ORAL
  Filled 2014-05-29: qty 30

## 2014-05-29 MED ORDER — TOBRAMYCIN 0.3 % OP SOLN: 2.0000 [drp] | OPHTHALMIC | Status: DC

## 2014-05-29 MED ORDER — OMEPRAZOLE 40 MG PO CPDR: 40.0000 mg | DELAYED_RELEASE_CAPSULE | Freq: Every day | ORAL | Status: DC

## 2014-05-29 NOTE — ED Notes (Signed)
Pt reports pain is returning. Primary nurse updated.

## 2014-05-29 NOTE — ED Provider Notes (Signed)
CSN: [id number]     Arrival date & time 05/29/14  1458 History   First MD Initiated Contact with Patient 05/29/14 1813     Chief Complaint  Patient presents with  . Chest Pain     (Consider location/radiation/quality/duration/timing/severity/associated sxs/prior Treatment) Patient is a 58 y.o. female presenting with chest pain. The history is provided by the patient.  Chest Pain  She complains of persistent, chest pain, for 3 days. It is present, every day, all day. She has had a mild, nonproductive cough. She denies fever, chills, weakness, or dizziness. She has a chronically draining left eye, for 2 years. She states that she has never had a stroke. She states that she has had hypothyroidism but is out of her medications. She now has new insurance, so she will attempt to see a doctor soon. There are no other known modifying factors.  Past Medical History  Diagnosis Date  . Stroke   . Hypertension    History reviewed. No pertinent past surgical history. No family history on file. History  Substance Use Topics  . Smoking status: Never Smoker   . Smokeless tobacco: Not on file  . Alcohol Use: No   OB History   Grav Para Term Preterm Abortions TAB SAB Ect Mult Living                 Review of Systems  Cardiovascular: Positive for chest pain.  All other systems reviewed and are negative.     Allergies  Review of patient's allergies indicates no known allergies.  Home Medications   Prior to Admission medications   Medication Sig Start Date End Date Taking? Authorizing Provider  ibuprofen (ADVIL,MOTRIN) 200 MG tablet Take 200 mg by mouth every 6 (six) hours as needed.   Yes Historical Provider, MD  omeprazole (PRILOSEC) 40 MG capsule Take 1 capsule (40 mg total) by mouth daily. 05/29/14   Richarda Blade, MD  tobramycin (TOBREX) 0.3 % ophthalmic solution Place 2 drops into the left eye every 2 (two) hours. 05/29/14   Richarda Blade, MD   BP 183/103  Pulse 53  Temp(Src)  98.7 F (37.1 C)  Resp 16  SpO2 98% Physical Exam  Nursing note and vitals reviewed. Constitutional: She is oriented to person, place, and time. She appears well-developed.  She is overweight  HENT:  Head: Normocephalic and atraumatic.  Eyes: Conjunctivae and EOM are normal. Pupils are equal, round, and reactive to light. No scleral icterus.  Mild injection, left eye with a small amount of purulent drainage. There is no swelling around the left eye  Neck: Normal range of motion and phonation normal. Neck supple.  Cardiovascular: Normal rate, regular rhythm and intact distal pulses.   Pulmonary/Chest: Effort normal and breath sounds normal. She exhibits no tenderness.  Abdominal: Soft. She exhibits no distension. There is no tenderness. There is no guarding.  Musculoskeletal: Normal range of motion.  Neurological: She is alert and oriented to person, place, and time. She exhibits normal muscle tone.  Skin: Skin is warm and dry.  Psychiatric: She has a normal mood and affect. Her behavior is normal. Judgment and thought content normal.    ED Course  Procedures (including critical care time)  Medications  gi cocktail (Maalox,Lidocaine,Donnatal) (30 mLs Oral Given 05/29/14 1852)    Patient Vitals for the past 24 hrs:  BP Temp Pulse Resp SpO2  05/29/14 1900 183/103 mmHg - 53 16 98 %  05/29/14 1845 174/91 mmHg - 51 15 98 %  05/29/14 1815 186/99 mmHg - 54 15 99 %  05/29/14 1745 186/102 mmHg - 52 18 99 %  05/29/14 1730 169/93 mmHg - 51 19 99 %  05/29/14 1700 154/101 mmHg - 54 17 99 %  05/29/14 1651 174/92 mmHg - 54 17 99 %  05/29/14 1615 182/106 mmHg - 55 15 99 %  05/29/14 1509 171/99 mmHg 98.7 F (37.1 C) 57 16 98 %    7:25 PM Reevaluation with update and discussion. After initial assessment and treatment, an updated evaluation reveals she states that the GI cocktail, resolved the chest painWENTZ,ELLIOTT L   Findings discussed with patient and son, all questions answered  Labs  Review Labs Reviewed  COMPREHENSIVE METABOLIC PANEL - Abnormal; Notable for the following:    Creatinine, Ser 1.17 (*)    AST 41 (*)    Total Bilirubin 0.2 (*)    GFR calc non Af Amer 50 (*)    GFR calc Af Amer 57 (*)    All other components within normal limits  CBC - Abnormal; Notable for the following:    RBC 3.36 (*)    Hemoglobin 11.0 (*)    HCT 33.7 (*)    MCV 100.3 (*)    All other components within normal limits  URINALYSIS, ROUTINE W REFLEX MICROSCOPIC - Abnormal; Notable for the following:    APPearance CLOUDY (*)    Hgb urine dipstick TRACE (*)    Leukocytes, UA SMALL (*)    All other components within normal limits  URINE MICROSCOPIC-ADD ON - Abnormal; Notable for the following:    Squamous Epithelial / LPF FEW (*)    All other components within normal limits  I-STAT TROPOININ, ED    Imaging Review Dg Chest 2 View  05/29/2014   CLINICAL DATA:  Substernal chest pain radiating to the back. Shortness of breath with exertion.  EXAM: CHEST  2 VIEW  COMPARISON:  None.  FINDINGS: Trachea is midline. Heart is at the upper limits of normal in size to mildly enlarged. Mild biapical pleural thickening. Lungs are clear. No pleural fluid.  IMPRESSION: No acute findings.   Electronically Signed   By: Lorin Picket M.D.   On: 05/29/2014 16:41     EKG Interpretation   Date/Time:  Friday May 29 2014 15:06:08 EDT Ventricular Rate:  57 PR Interval:  176 QRS Duration: 82 QT Interval:  416 QTC Calculation: 404 R Axis:   -52 Text Interpretation:  Sinus bradycardia Left axis deviation Anterior  infarct , age undetermined Abnormal ECG since last tracing no significant  change Confirmed by Eulis Foster  MD, ELLIOTT [phone]) on 05/29/2014 5:35:01 PM      MDM   Final diagnoses:  Nonspecific chest pain  Conjunctivitis of left eye    Chest pain is atypical for coronary artery disease or ischemia. She is at low risk cardiac patient. Evaluation is consistent with GI source of chest  discomfort. She has an incidental left eye infection that is chronic. It is unclear what this represents. It is reasonable to treat it with antibiotic eyedrops and close observation. She has mild hypertension without evidence for endorgan damage.  Omeprazole Nursing Notes Reviewed/ Care Coordinated Applicable Imaging Reviewed Interpretation of Laboratory Data incorporated into ED treatment  The patient appears reasonably screened and/or stabilized for discharge and I doubt any other medical condition or other [facility] requiring further screening, evaluation, or treatment in the ED at this time prior to discharge.  Plan: Home Medications-Tobrex eye drops,  restturn here  if the recommended treatment, does not improve the symptoms; Recommended follow up- PCP of choice asap   Richarda Blade, MD 05/29/14 1931

## 2014-05-29 NOTE — ED Notes (Addendum)
Cp started 3 days ago thought it was indigestion  No  V/d had some nausea pain does radiate to back some sob  Went to [facility] today and was sent for further tests and ? Ab normal ekg

## 2014-05-29 NOTE — ED Notes (Signed)
PA Made aware of the patient's pain. PA stated, "[facility] of [place] will be in there soon." Patient made aware.

## 2014-05-29 NOTE — ED Notes (Signed)
Patient returned from [facility]. NExtern, Amy at the bedside.

## 2014-05-29 NOTE — ED Notes (Signed)
Patient placed in room. NT at the bedside.

## 2014-05-29 NOTE — Discharge Instructions (Signed)
Used the resource guide, to help you find a physician. Try to followup with a primary care doctor for checkup in 2-3 weeks. You will need to have your blood sugar rechecked when you see them.     Chest Pain (Nonspecific) It is often hard to give a specific diagnosis for the cause of chest pain. There is always a chance that your pain could be related to something serious, such as a heart attack or a blood clot in the lungs. You need to follow up with your health care provider for further evaluation. CAUSES   Heartburn.  Pneumonia or bronchitis.  Anxiety or stress.  Inflammation around your heart (pericarditis) or lung (pleuritis or pleurisy).  A blood clot in the lung.  A collapsed lung (pneumothorax). It can develop suddenly on its own (spontaneous pneumothorax) or from trauma to the chest.  Shingles infection (herpes zoster virus). The chest wall is composed of bones, muscles, and cartilage. Any of these can be the source of the pain.  The bones can be bruised by injury.  The muscles or cartilage can be strained by coughing or overwork.  The cartilage can be affected by inflammation and become sore (costochondritis). DIAGNOSIS  Lab tests or other studies may be needed to find the cause of your pain. Your health care provider may have you take a test called an ambulatory electrocardiogram (ECG). An ECG records your heartbeat patterns over a 24-hour period. You may also have other tests, such as:  Transthoracic echocardiogram (TTE). During echocardiography, sound waves are used to evaluate how blood flows through your heart.  Transesophageal echocardiogram (TEE).  Cardiac monitoring. This allows your health care provider to monitor your heart rate and rhythm in real time.  Holter monitor. This is a portable device that records your heartbeat and can help diagnose heart arrhythmias. It allows your health care provider to track your heart activity for several days, if  needed.  Stress tests by exercise or by giving medicine that makes the heart beat faster. TREATMENT   Treatment depends on what may be causing your chest pain. Treatment may include:  Acid blockers for heartburn.  Anti-inflammatory medicine.  Pain medicine for inflammatory conditions.  Antibiotics if an infection is present.  You may be advised to change lifestyle habits. This includes stopping smoking and avoiding alcohol, caffeine, and chocolate.  You may be advised to keep your head raised (elevated) when sleeping. This reduces the chance of acid going backward from your stomach into your esophagus. Most of the time, nonspecific chest pain will improve within 2-3 days with rest and mild pain medicine.  HOME CARE INSTRUCTIONS   If antibiotics were prescribed, take them as directed. Finish them even if you start to feel better.  For the next few days, avoid physical activities that bring on chest pain. Continue physical activities as directed.  Do not use any tobacco products, including cigarettes, chewing tobacco, or electronic cigarettes.  Avoid drinking alcohol.  Only take medicine as directed by your health care provider.  Follow your health care provider's suggestions for further testing if your chest pain does not go away.  Keep any follow-up appointments you made. If you do not go to an appointment, you could develop lasting (chronic) problems with pain. If there is any problem keeping an appointment, call to reschedule. SEEK MEDICAL CARE IF:   Your chest pain does not go away, even after treatment.  You have a rash with blisters on your chest.  You have a  fever. SEEK IMMEDIATE MEDICAL CARE IF:   You have increased chest pain or pain that spreads to your arm, neck, jaw, back, or abdomen.  You have shortness of breath.  You have an increasing cough, or you cough up blood.  You have severe back or abdominal pain.  You feel nauseous or vomit.  You have severe  weakness.  You faint.  You have chills. This is an emergency. Do not wait to see if the pain will go away. Get medical help at once. Call your local emergency services (911 in U.S.). Do not drive yourself to the hospital. MAKE SURE YOU:   Understand these instructions.  Will watch your condition.  Will get help right away if you are not doing well or get worse. Document Released: 08/30/2005 Document Revised: 11/25/2013 Document Reviewed: 06/25/2008 [facility] Patient Information 2015 [place], [place]. This information is not intended to replace advice given to you by your health care provider. Make sure you discuss any questions you have with your health care provider.  Conjunctivitis Conjunctivitis is commonly called "pink eye." Conjunctivitis can be caused by bacterial or viral infection, allergies, or injuries. There is usually redness of the lining of the eye, itching, discomfort, and sometimes discharge. There may be deposits of matter along the eyelids. A viral infection usually causes a watery discharge, while a bacterial infection causes a yellowish, thick discharge. Pink eye is very contagious and spreads by direct contact. You may be given antibiotic eyedrops as part of your treatment. Before using your eye medicine, remove all drainage from the eye by washing gently with warm water and cotton balls. Continue to use the medication until you have awakened 2 mornings in a row without discharge from the eye. Do not rub your eye. This increases the irritation and helps spread infection. Use separate towels from other household members. Wash your hands with soap and water before and after touching your eyes. Use cold compresses to reduce pain and sunglasses to relieve irritation from light. Do not wear contact lenses or wear eye makeup until the infection is gone. SEEK MEDICAL CARE IF:   Your symptoms are not better after 3 days of treatment.  You have increased pain or trouble  seeing.  The outer eyelids become very red or swollen. Document Released: 12/28/2004 Document Revised: 02/12/2012 Document Reviewed: 11/20/2005 [facility] Patient Information 2015 [place], [place]. This information is not intended to replace advice given to you by your health care provider. Make sure you discuss any questions you have with your health care provider.   Emergency Department Resource Guide 1) Find a Doctor and Pay Out of Pocket Although you won't have to find out who is covered by your insurance plan, it is a good idea to ask around and get recommendations. You will then need to call the office and see if the doctor you have chosen will accept you as a new patient and what types of options they offer for patients who are self-pay. Some doctors offer discounts or will set up payment plans for their patients who do not have insurance, but you will need to ask so you aren't surprised when you get to your appointment.  2) Contact Your Local Health Department Not all health departments have doctors that can see patients for sick visits, but many do, so it is worth a call to see if yours does. If you don't know where your local health department is, you can check in your phone book. The CDC also has a  tool to help you locate your state's health department, and many state websites also have listings of all of their local health departments.  3) Find a [facility] If your illness is not likely to be very severe or complicated, you may want to try a walk in clinic. These are popping up all over the country in pharmacies, drugstores, and shopping centers. They're usually staffed by nurse practitioners or physician assistants that have been trained to treat common illnesses and complaints. They're usually fairly quick and inexpensive. However, if you have serious medical issues or chronic medical problems, these are probably not your best option.  No Primary Care Doctor: - Call Health Connect at   [phone] - they can help you locate a primary care doctor that  accepts your insurance, provides certain services, etc. - Physician Referral Service- [phone]  Chronic Pain Problems: Organization         Address  Phone   Notes  [facility]  [phone] Patients need to be referred by their primary care doctor.   Medication Assistance: Organization         Address  Phone   Notes  [facility] Medication [facility] [address] [place], [place] [address] [phone] --Must be a resident of [facility] -- Must have NO insurance coverage whatsoever (no Medicaid/ Medicare, etc.) -- The pt. MUST have a primary care doctor that directs their care regularly and follows them in the community   MedAssist  [phone]   [employer]  [phone]    Agencies that provide inexpensive medical care: Organization         Address  Phone   Notes  [place]  [phone]   [facility]    [phone]   [facility] [place] [place], [place] [address] [phone]   [place] [address] [place] [phone]   Planned Parenthood    [phone]   [facility]    [phone]   [place] and [place] [address], [place] Phone:  [phone], Fax:  [phone] Hours of Operation:  9 am - 6 pm, M-F.  Also accepts Medicaid/Medicare and self-pay.  [facility] for [place], [address], [place] Phone: [phone], Fax: [phone]. Hours of Operation:  8:30 am - 5:30 pm, M-F.  Also accepts Medicaid and self-pay.  [facility] [place] [address] [place] Phone: [phone]   [place], [place], [place] [phone], Ext. [phone] Mondays & Thursdays: 7-9 AM.  First 15 patients are seen on a first come, first serve basis.     [place] Providers:  Organization         Address  Phone   Notes  [facility] [address], [place] [phone] Also accepts self-pay patients.  [facility] [address], [place]  [phone]   [place], [address], [place] [phone]   [facility] [address] [place] [phone]   Lucianne Lei [address], [place]   [phone] Only accepts [place] Access [place] patients after they have their name applied to their card.   Self-Pay (no insurance) in [place]:  Organization         Address  Phone   Notes  Sickle Cell Patients, [facility] [place] [phone]   [facility] [place] [phone]   [facility] [place]  [place], [address], [place] [phone]   [facility]/Dr. Osei-Bonsu  [address], [place] or [address], [place] [phone] Phone number for both [place] and [place] locations is the same.  [facility] [address] [place] [phone]   [facility] [address], [place] or [address] [phone]   [facility] [address], [place] [phone], phone; [phone], fax Sees patients 1st and 3rd Saturday of every month.  Must not qualify for public or private insurance (i.e. Medicaid, Medicare, [place] Health Choice, Veterans' Benefits)  Household income should be no more than 200% of the poverty level The clinic cannot treat you if you are pregnant or think you are pregnant  Sexually transmitted diseases are not treated at the clinic.    Dental Care: Organization         Address  Phone  Notes  [facility]  Department of [facility] [place] [phone] Accepts children up to age 98 who are enrolled in [place] or [place]; pregnant women with a Medicaid card; and children who have applied for Medicaid or [place] Health Choice, but were declined, whose parents can pay a reduced fee at time of service.  [facility] Department of [facility]  [address], [place] [phone] Accepts children up to age 50 who are enrolled in [place] or [place]; pregnant women with a Medicaid card; and children who have applied for Medicaid or  Health Choice, but were declined, whose parents can pay a reduced fee at time of service.  [facility] Access PRO[facility]  [place] [phone] Patients are seen by appointment only. Walk-ins are not accepted. [place] will see patients 33 years of age and older. Monday - Tuesday (8am-5pm) Most Wednesdays (8:30-5pm) $30 per visit, cash only  [facility] Adult Dental Access PROGRAM  [address], [facility] [phone] Patients are seen by appointment only. Walk-ins are not accepted. [place] will see patients 24 years of age and older. One Wednesday Evening (Monthly: Volunteer Based).  $30 per visit, cash only  [place]  [phone] for adults; Children under age 3, call Graduate Pediatric Dentistry at [phone]. Children aged 20-14, please call [phone] to request a pediatric application.  Dental services are provided in all areas of dental care including fillings, crowns and bridges, complete and partial dentures, implants, gum treatment, root canals, and extractions. Preventive care is also provided. Treatment is provided to both adults and children. Patients are selected via a lottery and there is often a waiting list.   [facility] [address] [place]  [phone]  [email or web address]   [facility] [address] [place], [place] [phone], Ext. [phone] Second and Fourth Thursday of each month, opens at 6:30 AM; Clinic ends at 9 AM.  Patients are seen on a first-come first-served basis, and a limited number are seen during each clinic.  [facility]  [address] Hillard Danker [place], [place] [phone]   Eligibility Requirements You must have lived in [place], [place], or [place] counties for at least the last three months.   You cannot be eligible for state or federal sponsored [employer], including [employer], [place], or [employer].   You generally cannot be eligible for healthcare insurance through your employer.    How to apply: Eligibility screenings are held every Tuesday and Wednesday afternoon from 1:00 pm until 4:00 pm. You do not need an appointment for the interview!  [facility] [address] [place], [place]   [place]  [place] Department  [place]  [phone]    Behavioral Health Resources in the Community: Intensive Outpatient Programs Organization         Address  Phone  Notes  [place]. [address], [place], [place] [phone]   [facility] Outpatient [address] [place], [place]   ADS: Alcohol & Drug Svcs [address], [place], [place]   [place] [address]  [place], [place] or [phone]   Substance Abuse Resources Organization         Address  Phone  Notes  Alcohol and Drug Services  [phone]   [place]  [phone]   The [place]   Chinita Pester  [phone]   Residential & Outpatient Substance Abuse Program  [phone]   Psychological Services Organization          Address  Phone  Notes  [facility] [place]  [place]  [phone]   [place] [address], [place] or [phone]    Mobile Crisis Teams Organization         Address  Phone  Notes  [facility], Mobile Crisis Care Unit  [phone]   [facility]  [address] [place], [place]   [place] [address] [place] [phone]    Self-Help/Support Groups Organization         Address  Phone             Notes  [place]. of [place] - variety of support groups  [place] Call for more information  Narcotics Anonymous (NA), [facility]aring [facility] [address], [employer] [place]  2 meetings at this location   Special educational needs teacher         Address  Phone  Notes  [facility] Residential Treatment [place],    [place]  [phone]   [facility]  [address] [place] [address]902, [place], [place]   [place], [place] [phone] Admissions: 8am-3pm M-F  Incentives Substance [place] [address],    [place], [place] [phone]   [facility] [address] Jadene Pierini [place], [place]   [facility] [address],  [place], [place]   [facility] - Intensive Outpatient [address], [place], [place]   [facility] ([place].,  [place], [place] or [phone]   [facility]idential Treatment Services (RTS) [address] [place], [place] Accepts Medicaid  [facility] [place] [address]  [place] [phone] Substance Abuse/Addiction Treatment  [facility] Organization         Address  Phone  Notes  [facility]  [phone]   Domenic Schwab, PhD [address] Arlis [place], [place]   [phone] or [phone]   [place], [place] [phone]   [address], [place], [place] [phone] Insurance/Medicaid/sponsorship through [facility] and Families [address], [address] [place]                                    [place], [place] [phone] [place] [address], [place] [phone]    Dr. Adele Schilder  [phone]   [facility] Dept. 1) [address] [place] 2) [place] 3)  [place] [address], [place] [phone]  [phone]   [address] [phone] or [phone] (After Hours)

## 2014-05-29 NOTE — ED Notes (Signed)
Dr. Wentz at the bedside.  

## 2015-10-05 DIAGNOSIS — I2510 Atherosclerotic heart disease of native coronary artery without angina pectoris: Secondary | ICD-10-CM | POA: Insufficient documentation

## 2015-10-05 HISTORY — DX: Atherosclerotic heart disease of native coronary artery without angina pectoris: I25.10

## 2015-10-24 ENCOUNTER — Emergency Department (HOSPITAL_COMMUNITY): Payer: 59

## 2015-10-24 ENCOUNTER — Inpatient Hospital Stay (HOSPITAL_COMMUNITY)
Admission: EM | Admit: 2015-10-24 | Discharge: 2015-10-26 | DRG: 281 | Disposition: A | Discharge status: Home / Self Care | Payer: 59 | Attending: Internal Medicine | Admitting: Internal Medicine

## 2015-10-24 ENCOUNTER — Encounter (HOSPITAL_COMMUNITY): Payer: Self-pay | Admitting: Emergency Medicine

## 2015-10-24 DIAGNOSIS — E039 Hypothyroidism, unspecified: Secondary | ICD-10-CM | POA: Diagnosis present

## 2015-10-24 DIAGNOSIS — R7989 Other specified abnormal findings of blood chemistry: Secondary | ICD-10-CM | POA: Diagnosis present

## 2015-10-24 DIAGNOSIS — I6300 Cerebral infarction due to thrombosis of unspecified precerebral artery: Secondary | ICD-10-CM | POA: Diagnosis not present

## 2015-10-24 DIAGNOSIS — I10 Essential (primary) hypertension: Secondary | ICD-10-CM | POA: Insufficient documentation

## 2015-10-24 DIAGNOSIS — I252 Old myocardial infarction: Secondary | ICD-10-CM

## 2015-10-24 DIAGNOSIS — Z8673 Personal history of transient ischemic attack (TIA), and cerebral infarction without residual deficits: Secondary | ICD-10-CM | POA: Diagnosis not present

## 2015-10-24 DIAGNOSIS — I214 Non-ST elevation (NSTEMI) myocardial infarction: Secondary | ICD-10-CM | POA: Diagnosis present

## 2015-10-24 DIAGNOSIS — K219 Gastro-esophageal reflux disease without esophagitis: Secondary | ICD-10-CM | POA: Diagnosis present

## 2015-10-24 DIAGNOSIS — I639 Cerebral infarction, unspecified: Secondary | ICD-10-CM | POA: Diagnosis present

## 2015-10-24 DIAGNOSIS — I160 Hypertensive urgency: Secondary | ICD-10-CM | POA: Diagnosis present

## 2015-10-24 DIAGNOSIS — R079 Chest pain, unspecified: Secondary | ICD-10-CM | POA: Diagnosis present

## 2015-10-24 DIAGNOSIS — I2510 Atherosclerotic heart disease of native coronary artery without angina pectoris: Secondary | ICD-10-CM | POA: Diagnosis present

## 2015-10-24 DIAGNOSIS — A084 Viral intestinal infection, unspecified: Secondary | ICD-10-CM | POA: Diagnosis present

## 2015-10-24 DIAGNOSIS — I209 Angina pectoris, unspecified: Secondary | ICD-10-CM | POA: Diagnosis not present

## 2015-10-24 DIAGNOSIS — E785 Hyperlipidemia, unspecified: Secondary | ICD-10-CM | POA: Diagnosis present

## 2015-10-24 DIAGNOSIS — I248 Other forms of acute ischemic heart disease: Secondary | ICD-10-CM | POA: Diagnosis present

## 2015-10-24 DIAGNOSIS — I161 Hypertensive emergency: Secondary | ICD-10-CM | POA: Diagnosis present

## 2015-10-24 DIAGNOSIS — R778 Other specified abnormalities of plasma proteins: Secondary | ICD-10-CM | POA: Diagnosis present

## 2015-10-24 DIAGNOSIS — I119 Hypertensive heart disease without heart failure: Secondary | ICD-10-CM | POA: Diagnosis present

## 2015-10-24 DIAGNOSIS — R197 Diarrhea, unspecified: Secondary | ICD-10-CM | POA: Diagnosis present

## 2015-10-24 DIAGNOSIS — E038 Other specified hypothyroidism: Secondary | ICD-10-CM | POA: Diagnosis not present

## 2015-10-24 HISTORY — DX: Other specified abnormal findings of blood chemistry: R79.89

## 2015-10-24 HISTORY — DX: Gastro-esophageal reflux disease without esophagitis: K21.9

## 2015-10-24 HISTORY — DX: Other specified abnormalities of plasma proteins: R77.8

## 2015-10-24 HISTORY — DX: Hypothyroidism, unspecified: E03.9

## 2015-10-24 HISTORY — DX: Old myocardial infarction: I25.2

## 2015-10-24 LAB — URINE MICROSCOPIC-ADD ON

## 2015-10-24 LAB — CBC
HCT: 35.9 % — ABNORMAL LOW (ref 36.0–46.0)
HEMOGLOBIN: 11.9 g/dL — AB (ref 12.0–15.0)
MCH: 31.1 pg (ref 26.0–34.0)
MCHC: 33.1 g/dL (ref 30.0–36.0)
MCV: 93.7 fL (ref 78.0–100.0)
PLATELETS: ADEQUATE 10*3/uL (ref 150–400)
RBC: 3.83 MIL/uL — AB (ref 3.87–5.11)
RDW: 14.4 % (ref 11.5–15.5)
WBC: 9 10*3/uL (ref 4.0–10.5)

## 2015-10-24 LAB — URINALYSIS, ROUTINE W REFLEX MICROSCOPIC
Bilirubin Urine: NEGATIVE
Glucose, UA: NEGATIVE mg/dL
KETONES UR: NEGATIVE mg/dL
NITRITE: NEGATIVE
PROTEIN: NEGATIVE mg/dL
Specific Gravity, Urine: 1.021 (ref 1.005–1.030)
pH: 5 (ref 5.0–8.0)

## 2015-10-24 LAB — COMPREHENSIVE METABOLIC PANEL
ALT: 35 U/L (ref 14–54)
ANION GAP: 12 (ref 5–15)
AST: 40 U/L (ref 15–41)
Albumin: 3.5 g/dL (ref 3.5–5.0)
Alkaline Phosphatase: 152 U/L — ABNORMAL HIGH (ref 38–126)
BUN: 13 mg/dL (ref 6–20)
CHLORIDE: 109 mmol/L (ref 101–111)
CO2: 22 mmol/L (ref 22–32)
CREATININE: 0.83 mg/dL (ref 0.44–1.00)
Calcium: 9.4 mg/dL (ref 8.9–10.3)
GFR calc non Af Amer: >60 mL/min (ref >60)
Glucose, Bld: 79 mg/dL (ref 65–99)
POTASSIUM: 3.9 mmol/L (ref 3.5–5.1)
SODIUM: 143 mmol/L (ref 135–145)
Total Bilirubin: 0.7 mg/dL (ref 0.3–1.2)
Total Protein: 7.5 g/dL (ref 6.5–8.1)

## 2015-10-24 LAB — I-STAT CG4 LACTIC ACID, ED
LACTIC ACID, VENOUS: 2.12 mmol/L — AB (ref 0.5–2.0)
Lactic Acid, Venous: 1.71 mmol/L (ref 0.5–2.0)

## 2015-10-24 LAB — PROTIME-INR
INR: 1.14 (ref 0.00–1.49)
Prothrombin Time: 14.7 seconds (ref 11.6–15.2)

## 2015-10-24 LAB — I-STAT TROPONIN, ED: Troponin i, poc: 0.03 ng/mL (ref 0.00–0.08)

## 2015-10-24 LAB — APTT: aPTT: 29 seconds (ref 24–37)

## 2015-10-24 LAB — TROPONIN I
TROPONIN I: 0.11 ng/mL — AB (ref <0.031)
Troponin I: 0.1 ng/mL — ABNORMAL HIGH (ref <0.031)

## 2015-10-24 IMAGING — DX DG CHEST 2V
2 series · 2 of 2 positions shown · non-contrast
Comparison: [DATE] chest radiograph.

CLINICAL DATA: Chest pain

EXAM:
CHEST  2 VIEW

[w chest pa]
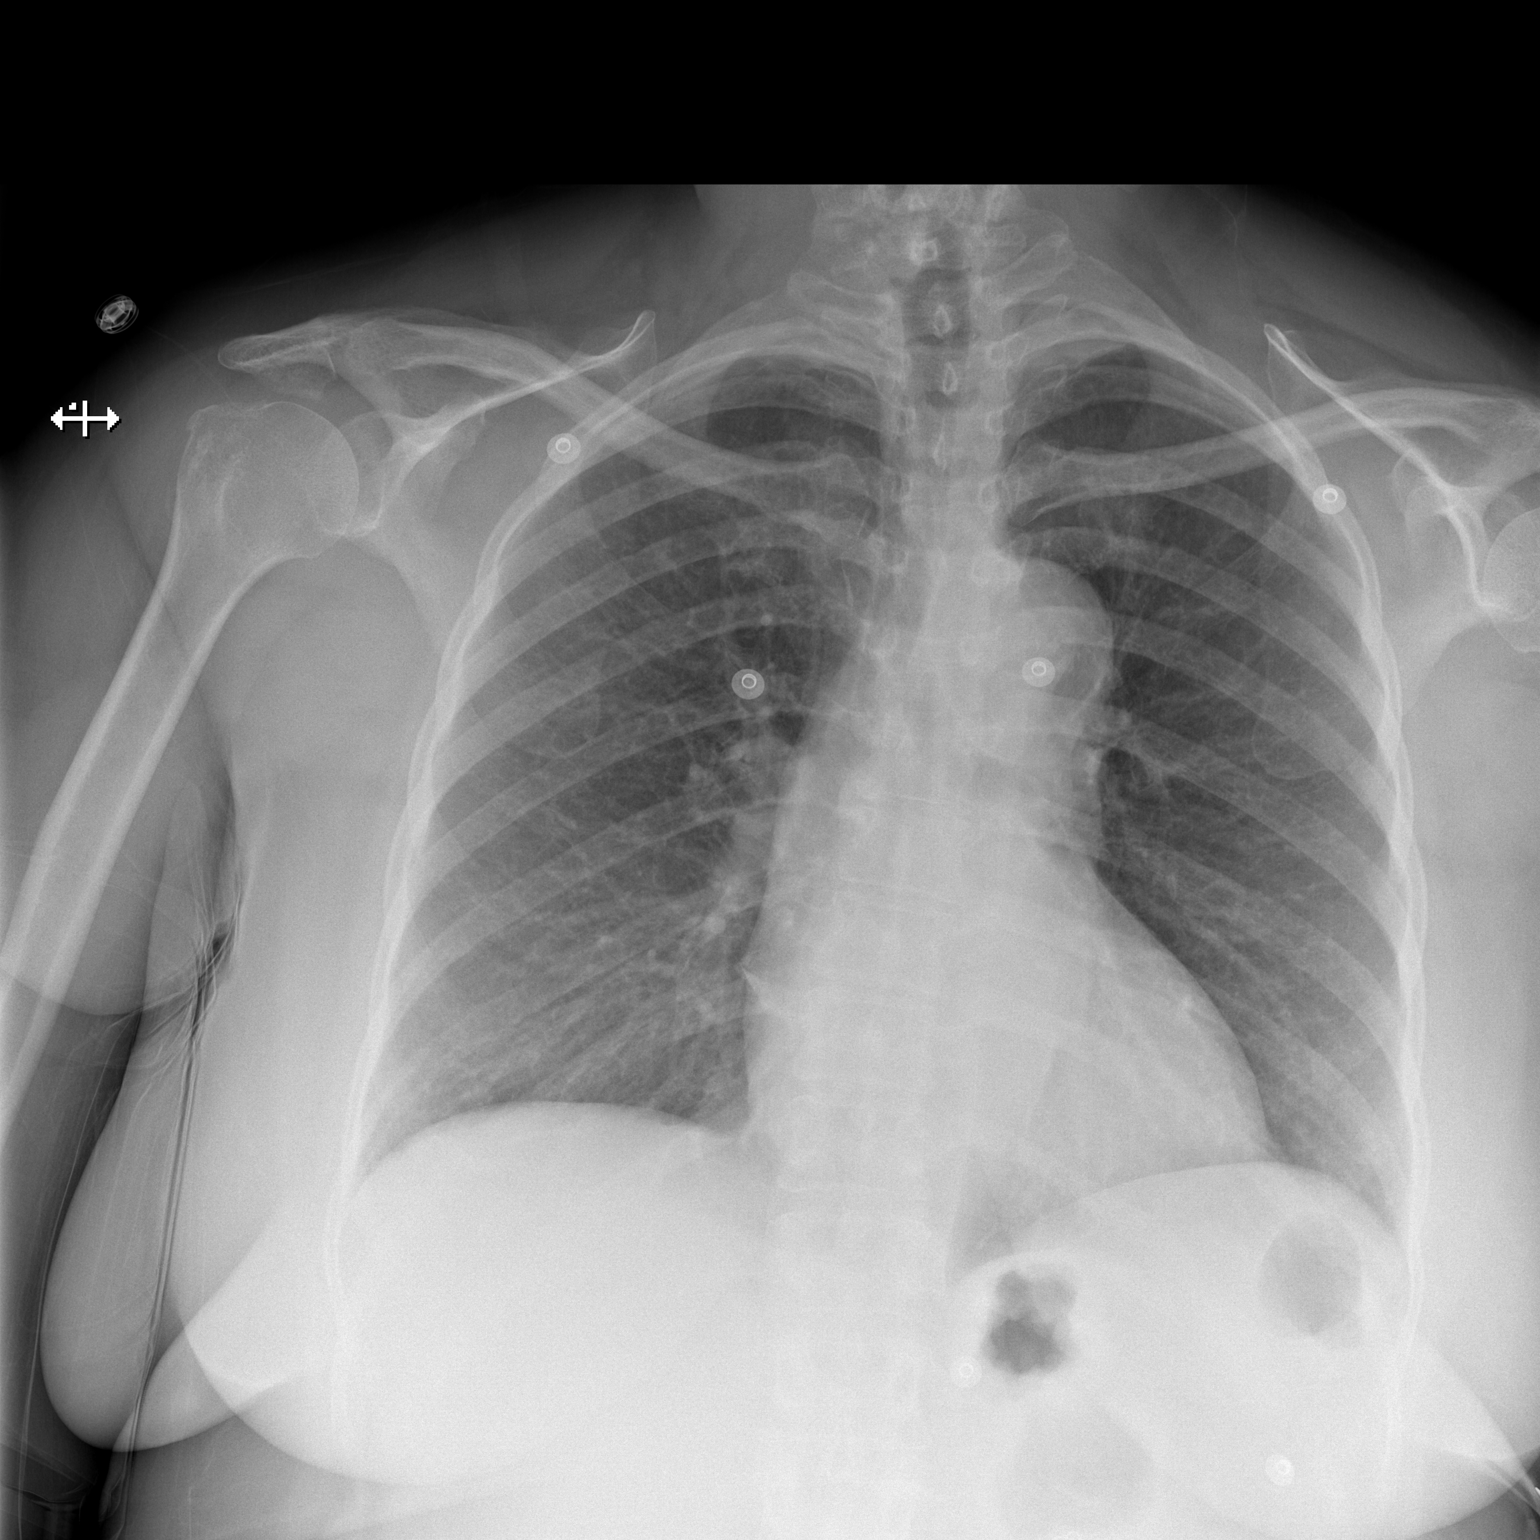

[w chest lat]
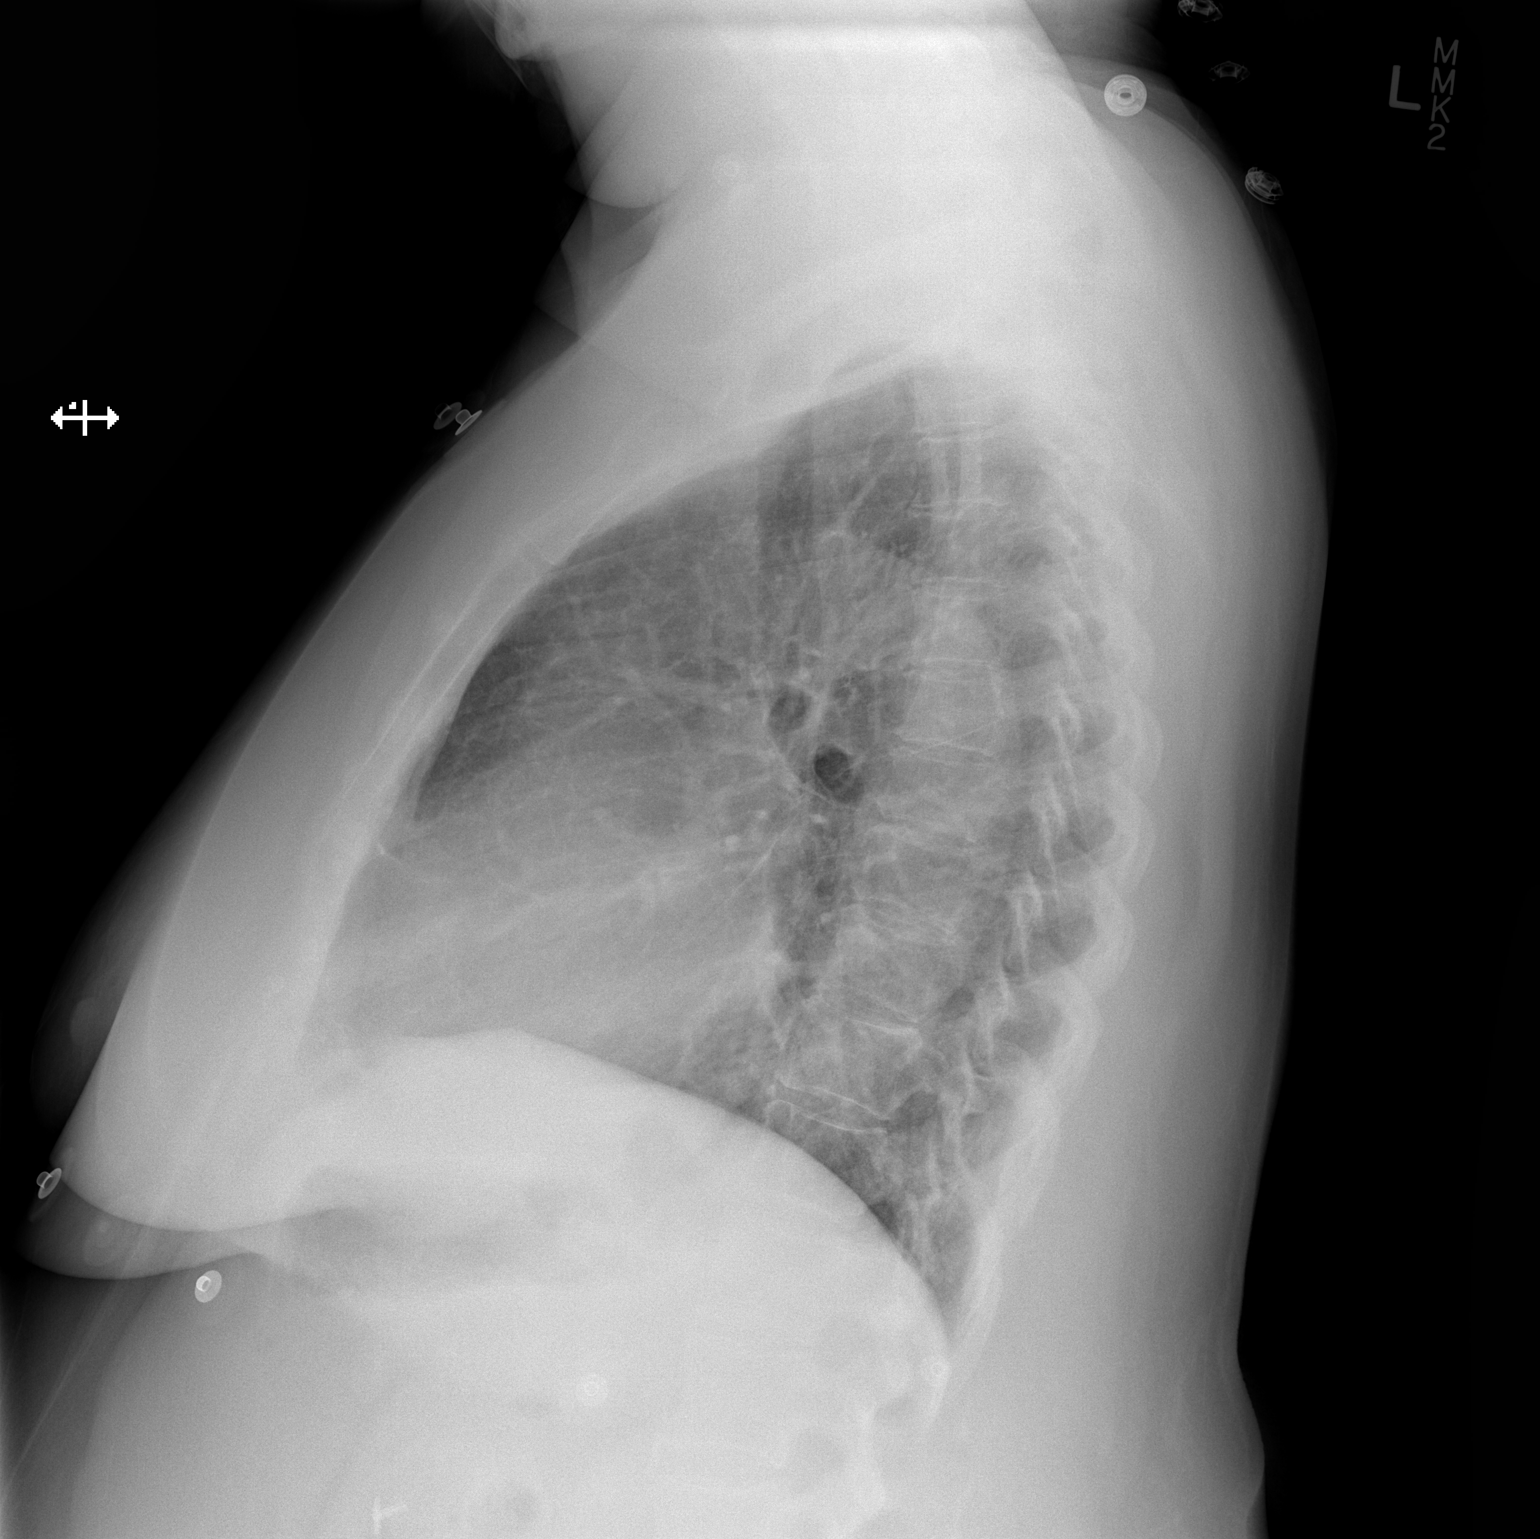

[2 of 2 positions shown; findings below may reference images not displayed]

FINDINGS: Stable cardiomediastinal silhouette with normal heart size. No
pneumothorax. No pleural effusion. Clear lungs, with no focal lung
consolidation and no pulmonary edema.
IMPRESSION: No active cardiopulmonary disease.

## 2015-10-24 MED ORDER — SODIUM CHLORIDE 0.9 % IV BOLUS (SEPSIS)
1000.0000 mL | Freq: Once | INTRAVENOUS | Status: AC
  Administered 2015-10-24: 1000 mL via INTRAVENOUS

## 2015-10-24 MED ORDER — ACETAMINOPHEN 325 MG PO TABS
650.0000 mg | ORAL_TABLET | Freq: Four times a day (QID) | ORAL | Status: DC | PRN
  Administered 2015-10-24 – 2015-10-26 (×2): 650 mg via ORAL
  Filled 2015-10-24 (×2): qty 2

## 2015-10-24 MED ORDER — ACETAMINOPHEN 650 MG RE SUPP: 650.0000 mg | Freq: Four times a day (QID) | RECTAL | Status: DC | PRN

## 2015-10-24 MED ORDER — SODIUM CHLORIDE 0.9 % IV SOLN: INTRAVENOUS | Status: DC

## 2015-10-24 MED ORDER — HEPARIN (PORCINE) IN NACL 100-0.45 UNIT/ML-% IJ SOLN
1600.0000 [IU]/h | INTRAMUSCULAR | Status: DC
  Administered 2015-10-25: 1500 [IU]/h via INTRAVENOUS
  Administered 2015-10-25: 1600 [IU]/h via INTRAVENOUS
  Filled 2015-10-24 (×2): qty 250

## 2015-10-24 MED ORDER — LEVOTHYROXINE SODIUM 112 MCG PO TABS
112.0000 ug | ORAL_TABLET | Freq: Every day | ORAL | Status: DC
  Administered 2015-10-25 – 2015-10-26 (×2): 112 ug via ORAL
  Filled 2015-10-24 (×3): qty 1

## 2015-10-24 MED ORDER — ENOXAPARIN SODIUM 100 MG/ML ~~LOC~~ SOLN
1.0000 mg/kg | Freq: Two times a day (BID) | SUBCUTANEOUS | Status: DC
  Filled 2015-10-24 (×2): qty 1

## 2015-10-24 MED ORDER — ONDANSETRON HCL 4 MG/2ML IJ SOLN: 4.0000 mg | Freq: Three times a day (TID) | INTRAMUSCULAR | Status: DC | PRN

## 2015-10-24 MED ORDER — HYDRALAZINE HCL 20 MG/ML IJ SOLN
5.0000 mg | INTRAMUSCULAR | Status: DC | PRN
  Administered 2015-10-24 – 2015-10-26 (×2): 5 mg via INTRAVENOUS
  Filled 2015-10-24 (×3): qty 1

## 2015-10-24 MED ORDER — ATORVASTATIN CALCIUM 40 MG PO TABS
40.0000 mg | ORAL_TABLET | Freq: Every day | ORAL | Status: DC
  Administered 2015-10-25 (×2): 40 mg via ORAL
  Filled 2015-10-24 (×2): qty 1

## 2015-10-24 MED ORDER — SODIUM CHLORIDE 0.9 % IV SOLN
INTRAVENOUS | Status: DC
  Administered 2015-10-25: via INTRAVENOUS

## 2015-10-24 MED ORDER — HEPARIN (PORCINE) IN NACL 100-0.45 UNIT/ML-% IJ SOLN
1500.0000 [IU]/h | INTRAMUSCULAR | Status: DC
  Filled 2015-10-24: qty 250

## 2015-10-24 MED ORDER — LISINOPRIL 10 MG PO TABS
10.0000 mg | ORAL_TABLET | Freq: Every day | ORAL | Status: DC
  Administered 2015-10-24 – 2015-10-26 (×3): 10 mg via ORAL
  Filled 2015-10-24 (×3): qty 1

## 2015-10-24 MED ORDER — PANTOPRAZOLE SODIUM 40 MG PO TBEC: 40.0000 mg | DELAYED_RELEASE_TABLET | Freq: Every day | ORAL | Status: DC

## 2015-10-24 MED ORDER — HEPARIN BOLUS VIA INFUSION
4000.0000 [IU] | Freq: Once | INTRAVENOUS | Status: DC
  Filled 2015-10-24: qty 4000

## 2015-10-24 MED ORDER — ASPIRIN 81 MG PO CHEW
324.0000 mg | CHEWABLE_TABLET | Freq: Every day | ORAL | Status: DC
  Administered 2015-10-25 – 2015-10-26 (×2): 324 mg via ORAL
  Filled 2015-10-24 (×2): qty 4

## 2015-10-24 MED ORDER — NITROGLYCERIN 0.4 MG SL SUBL: 0.4000 mg | SUBLINGUAL_TABLET | SUBLINGUAL | Status: DC | PRN

## 2015-10-24 MED ORDER — SODIUM CHLORIDE 0.9 % IJ SOLN
3.0000 mL | Freq: Two times a day (BID) | INTRAMUSCULAR | Status: DC
Start: 2015-10-24 — End: 2015-10-26
  Administered 2015-10-24 – 2015-10-25 (×2): 3 mL via INTRAVENOUS

## 2015-10-24 MED ORDER — FAMOTIDINE 20 MG PO TABS
20.0000 mg | ORAL_TABLET | Freq: Every day | ORAL | Status: DC
  Administered 2015-10-24 – 2015-10-26 (×3): 20 mg via ORAL
  Filled 2015-10-24 (×3): qty 1

## 2015-10-24 MED ORDER — HEPARIN SODIUM (PORCINE) 5000 UNIT/ML IJ SOLN
5000.0000 [IU] | Freq: Three times a day (TID) | INTRAMUSCULAR | Status: DC
  Administered 2015-10-24: 5000 [IU] via SUBCUTANEOUS
  Filled 2015-10-24: qty 1

## 2015-10-24 MED ORDER — ASPIRIN 81 MG PO CHEW
324.0000 mg | CHEWABLE_TABLET | Freq: Once | ORAL | Status: AC
  Administered 2015-10-24: 324 mg via ORAL
  Filled 2015-10-24: qty 4

## 2015-10-24 MED ORDER — MORPHINE SULFATE (PF) 2 MG/ML IV SOLN: 2.0000 mg | INTRAVENOUS | Status: DC | PRN

## 2015-10-24 NOTE — ED Provider Notes (Signed)
CSN: [id number]     Arrival date & time 10/24/15  1428 History   First MD Initiated Contact with Patient 10/24/15 1444     Chief Complaint  Patient presents with  . Weakness   HPI   Mary Dalton is a 59 y.o. F PMH significant for "mini-strokes" and HTN presenting with diaphoresis, SOB, CP while at [employer]. She describes her CP as left sided, constant for 5 min, pressure, 7/10 pain scale, and non-radiating. She denies fevers, abdominal pain, localized weakness, ill contacts. She states she has had these symptoms before but no workup was done.     Past Medical History  Diagnosis Date  . Stroke ([place])   . Hypertension    History reviewed. No pertinent past surgical history. History reviewed. No pertinent family history. Social History  Substance Use Topics  . Smoking status: Never Smoker   . Smokeless tobacco: None  . Alcohol Use: No   OB History    No data available     Review of Systems  Ten systems are reviewed and are negative for acute change except as noted in the HPI   Allergies  Review of patient's allergies indicates no known allergies.  Home Medications   Prior to Admission medications   Medication Sig Start Date End Date Taking? Authorizing Provider  levothyroxine (SYNTHROID, LEVOTHROID) 112 MCG tablet Take 112 mcg by mouth daily before breakfast.   Yes Historical Provider, MD  ibuprofen (ADVIL,MOTRIN) 200 MG tablet Take 200 mg by mouth every 6 (six) hours as needed.    Historical Provider, MD  omeprazole (PRILOSEC) 40 MG capsule Take 1 capsule (40 mg total) by mouth daily. 05/29/14   Daleen Bo, MD   BP 157/84 mmHg  Pulse 59  Temp(Src) 97.6 F (36.4 C) (Oral)  Resp 16  Ht 5\' 6"  (1.676 m)  Wt 205 lb (92.987 kg)  BMI 33.10 kg/m2  SpO2 100% Physical Exam  ED Course  Procedures (including critical care time) Labs Review Labs Reviewed  CBC  TROPONIN I  COMPREHENSIVE METABOLIC PANEL  URINALYSIS, ROUTINE W REFLEX MICROSCOPIC (NOT AT [facility])   I-STAT CG4 LACTIC ACID, ED    Imaging Review Dg Chest 2 View  10/24/2015  CLINICAL DATA:  Chest pain EXAM: CHEST  2 VIEW COMPARISON:  05/29/2014 chest radiograph. FINDINGS: Stable cardiomediastinal silhouette with normal heart size. No pneumothorax. No pleural effusion. Clear lungs, with no focal lung consolidation and no pulmonary edema. IMPRESSION: No active cardiopulmonary disease. Electronically Signed   By: Ilona Sorrel M.D.   On: 10/24/2015 16:15   I have personally reviewed and evaluated these images and lab results as part of my medical decision-making.   EKG Interpretation None      MDM   Final diagnoses:  NSTEMI (non-ST elevated myocardial infarction) (HCC)  Gastroesophageal reflux disease without esophagitis   Patient non-toxic appearing and VSS. Patient slightly hypertensive on exam, otherwise, exam unremarkable. Based on patient history and physical exam, most likely etiologies include ACS vs MSK vs GERD. Less likely etiologies include:  Prinzmetal's/cocaine-induced angina, pericarditis/pericardial effusion, cardiac tamponade, constrictive pericarditis, myocarditis, aortic dissection, thoracic aortic aneurysm, CHF/acute pulmonary edema, pneumonia, pleuritis, pneumothorax, tension pneumothorax, pulmonary embolism, pulmonary HTN, esophageal spasm, Mallory-Weiss tear, Boerhaave syndrome, peptic ulcer diease, biliary disease, pancreatitis,  Osteoarthritis/radiculopathy, herpes zoster, anxiety, sickle cell chest crisis.  Troponin elevated. Patient will need cardiology consult for NSTEMI.   Dr. Winfred Leeds saw patient, called cardiology consult, and placed admission orders per Dr. Stanford Breed from cardiology.   Patient  may be safely admitted. Patient in understanding and agreement with the plan.   Martha Lions, PA-C 11/09/15 2306  Orlie Dakin, MD 11/10/15 [phone]

## 2015-10-24 NOTE — Consult Note (Addendum)
ANTICOAGULATION CONSULT NOTE - Initial Consult  Pharmacy Consult for Lovenox to Heparin Indication: chest pain/ACS  No Known Allergies  Patient Measurements: Height: 5\' 6"  (167.6 cm) Weight: 205 lb (92.987 kg) IBW/kg (Calculated) : 59.3  Heparin dosing weight = 81 kg  Vital Signs: Temp: 97.6 F (36.4 C) (11/20 1433) Temp Source: Oral (11/20 1433) BP: 174/99 mmHg (11/20 1830) Pulse Rate: 72 (11/20 1830)  Labs:  Recent Labs  10/24/15 1700  HGB 11.9*  HCT 35.9*  PLT PLATELET CLUMPS NOTED ON SMEAR, COUNT APPEARS ADEQUATE  CREATININE 0.83  TROPONINI 0.10*    Estimated Creatinine Clearance: 81.8 mL/min (by C-G formula based on Cr of 0.83).   Medical History: Past Medical History  Diagnosis Date  . Stroke ([place])   . Hypertension    Assessment: Mary Dalton presents to the ED with chest pain, SOB, and diaphoresis. Initial troponin mildly elevated at 0.10. She will begin therapeutic lovenox for NSTEMI. Weight = 93kg. Renal function stable.  Now Lovenox (not given)  to Heparin  Goal of Therapy:  Heparin level = 0.3-0.7 Monitor platelets by anticoagulation protocol: Yes   Plan:  Heparin 4000 units iv bolus x 1 Heparin drip at 1500 units / hr Daily heparin level, CBC  Thank you Anette Guarneri, PharmD [phone] 10/24/2015,7:20 PM      ADDENDUM: Heparin was changed to SQ, gtt never started, now to begin heparin for ACS.  Will continue with plan above w/o bolus 2/2 recently rec'd SQ heparin. Wynona Neat, PharmD, BCPS 10/24/2015 11:40 PM

## 2015-10-24 NOTE — ED Notes (Signed)
Attempted report X1

## 2015-10-24 NOTE — ED Notes (Signed)
Pt was at church, felt hot. Pt had BM. Pt then had onset of CP, SOB and diaphoresis. Pt's CP subsided. Pt still feels SOB and clammy. Pt states she feels weak after the episode. BP initial 200/100 for EMS. CBG 102, EKG shows SR rate 72.

## 2015-10-24 NOTE — ED Provider Notes (Addendum)
ComPlanes of anterior nonradiating chest pain coming by generalized weakness shortness of breath and sweatiness onset 1:30 PM today while waiting for food at a restaurant. She is presently asymptomatic. Symptoms lasted 1 hour and resolve spontaneously. On exam alert Glasgow Coma Score lungs clear to auscultation heart regular rate and rhythm abdomen obese nontender extremities no edema. History and troponin consistent with NSTEMI. Spoke with Dr. Stanford Breed from cardiology service requests hospitalist service to admit patient. Cardiology service will see patient in consultation. Results for orders placed or performed during the hospital encounter of 10/24/15  CBC  Result Value Ref Range   WBC 9.0 4.0 - 10.5 K/uL   RBC 3.83 (L) 3.87 - 5.11 MIL/uL   Hemoglobin 11.9 (L) 12.0 - 15.0 g/dL   HCT 35.9 (L) 36.0 - 46.0 %   MCV 93.7 78.0 - 100.0 fL   MCH 31.1 26.0 - 34.0 pg   MCHC 33.1 30.0 - 36.0 g/dL   RDW 14.4 11.5 - 15.5 %   Platelets  150 - 400 K/uL    PLATELET CLUMPS NOTED ON SMEAR, COUNT APPEARS ADEQUATE  Troponin I  Result Value Ref Range   Troponin I 0.10 (H) <0.031 ng/mL  Comprehensive metabolic panel  Result Value Ref Range   Sodium 143 135 - 145 mmol/L   Potassium 3.9 3.5 - 5.1 mmol/L   Chloride 109 101 - 111 mmol/L   CO2 22 22 - 32 mmol/L   Glucose, Bld 79 65 - 99 mg/dL   BUN 13 6 - 20 mg/dL   Creatinine, Ser 0.83 0.44 - 1.00 mg/dL   Calcium 9.4 8.9 - 10.3 mg/dL   Total Protein 7.5 6.5 - 8.1 g/dL   Albumin 3.5 3.5 - 5.0 g/dL   AST 40 15 - 41 U/L   ALT 35 14 - 54 U/L   Alkaline Phosphatase 152 (H) 38 - 126 U/L   Total Bilirubin 0.7 0.3 - 1.2 mg/dL   GFR calc non Af Amer >60 >60 mL/min   GFR calc Af Amer >60 >60 mL/min   Anion gap 12 5 - 15  Urinalysis, Routine w reflex microscopic (not at [facility])  Result Value Ref Range   Color, Urine YELLOW YELLOW   APPearance CLOUDY (A) CLEAR   Specific Gravity, Urine 1.021 1.005 - 1.030   pH 5.0 5.0 - 8.0   Glucose, UA NEGATIVE NEGATIVE  mg/dL   Hgb urine dipstick TRACE (A) NEGATIVE   Bilirubin Urine NEGATIVE NEGATIVE   Ketones, ur NEGATIVE NEGATIVE mg/dL   Protein, ur NEGATIVE NEGATIVE mg/dL   Nitrite NEGATIVE NEGATIVE   Leukocytes, UA TRACE (A) NEGATIVE  Urine microscopic-add on  Result Value Ref Range   Squamous Epithelial / LPF 6-30 (A) NONE SEEN   WBC, UA 0-5 0 - 5 WBC/hpf   RBC / HPF 0-5 0 - 5 RBC/hpf   Bacteria, UA MANY (A) NONE SEEN   Casts HYALINE CASTS (A) NEGATIVE   Urine-Other MUCOUS PRESENT   I-Stat CG4 Lactic Acid, ED  Result Value Ref Range   Lactic Acid, Venous 2.12 (HH) 0.5 - 2.0 mmol/L   Comment NOTIFIED PHYSICIAN   I-Stat CG4 Lactic Acid, ED  Result Value Ref Range   Lactic Acid, Venous 1.71 0.5 - 2.0 mmol/L  I-stat troponin, ED  Result Value Ref Range   Troponin i, poc 0.03 0.00 - 0.08 ng/mL   Comment 3           Dg Chest 2 View  10/24/2015  CLINICAL DATA:  Chest pain EXAM: CHEST  2 VIEW COMPARISON:  05/29/2014 chest radiograph. FINDINGS: Stable cardiomediastinal silhouette with normal heart size. No pneumothorax. No pleural effusion. Clear lungs, with no focal lung consolidation and no pulmonary edema. IMPRESSION: No active cardiopulmonary disease. Electronically Signed   By: Ilona Sorrel M.D.   On: 10/24/2015 16:15   Spoke with Dr. Blaine Hamper who will arranger for inpt stay to telemtry, lovenox, asa orerdered  Orlie Dakin, MD 10/24/15 Lurena Nida  Orlie Dakin, MD 11/13/15 [phone]

## 2015-10-24 NOTE — H&P (Addendum)
[facility] History and Physical  Mary Dalton [id number] DOB: 09/14/1956 DOA: 10/24/2015  Referring physician: ED physician PCP: No PCP Per Patient  Specialists:   Chief Complaint: Chest pain and diarrhea  HPI: Mary Dalton is a 59 y.o. female with PMH of hypertension not on medications, GERD, stroke, hypothyroidism, left knee arthritis,who presents with chest pain and diarrhea.  Patient was in her usual state of health until today around 1:00PM when she had an episode of chest pain. Her chest pain is located in the substernal area, moderate, 6 out of 10 in severity, pressure-like, non-radiating. It is was not aggravated or alleviated by any known factors. Her chest pain lasted for about a few minutes, then resolved spontaneously. It was associated with diaphoresis, weakness and shortness of breath. Her blood pressure was checked, which was elevated at 200/100. She states that she has not been taking blood pressure medications since her bp has been normal recently. She visited her PCP one and half months ago, and her blood pressure was checked and was normal in that time. Patient does not have fever, chills, cough. No tenderness over calf areas. When I saw pt in ED, she is chest pain-free.  She also reports having diarrhea started this morning. She has had 3 bowel movements with watery stool. She has some mild nausea, but no vomiting or abdominal pain. No recent antibiotics use.patient does not have symptoms of  UTI, unilateral weakness, hematuria, hematemesis, hematochezia   In ED, patient was found to have troponin 0.10-->0.03-->0.11, lactate 2.12-->1.71, urinalysis showed trace amount of leukocytes, temperature normal, electrolytes okay, renal function okay, negative chest x-ray. Patient submitted to inpatient for further eval and treatment. Cardiology was consulted.  Where does patient live?   At home  Can patient participate in ADLs?  Some   Review of Systems:    General: no fevers, chills, no changes in body weight, has poor appetite, has fatigue HEENT: no blurry vision, hearing changes or sore throat Pulm: has dyspnea, no coughing, wheezing CV: has chest pain, no palpitations Abd: has nausea, diarrhea, No abdominal pain and vomiting, constipation GU: no dysuria, burning on urination, increased urinary frequency, hematuria  Ext: no leg edema Neuro: no unilateral weakness, numbness, or tingling, no vision change or hearing loss Skin: no rash MSK: No muscle spasm, no deformity, no limitation of range of movement in spin Heme: No easy bruising.  Travel history: No recent long distant travel.  Allergy: No Known Allergies  Past Medical History  Diagnosis Date  . Stroke ([place])   . Hypertension   . GERD (gastroesophageal reflux disease)   . Hypothyroidism     History reviewed. No pertinent past surgical history.  Social History:  reports that she has never smoked. She does not have any smokeless tobacco history on file. She reports that she does not drink alcohol or use illicit drugs.  Family History: Reviewed, but patient reports that all family members are healthy, no significant family medical history   Prior to Admission medications   Medication Sig Start Date End Date Taking? Authorizing Provider  levothyroxine (SYNTHROID, LEVOTHROID) 112 MCG tablet Take 112 mcg by mouth daily before breakfast.   Yes Historical Provider, MD  ibuprofen (ADVIL,MOTRIN) 200 MG tablet Take 200 mg by mouth every 6 (six) hours as needed.    Historical Provider, MD  omeprazole (PRILOSEC) 40 MG capsule Take 1 capsule (40 mg total) by mouth daily. 05/29/14   Daleen Bo, MD    Physical Exam: Danley Danker Vitals:  10/24/15 2010 10/24/15 2055 10/24/15 2311 10/24/15 2320  BP: 181/90 185/74 141/62 145/54  Pulse: 73   68  Temp:    98.3 F (36.8 C)  TempSrc:    Oral  Resp: 24   21  Height:      Weight:      SpO2: 99%   98%   General: Not in acute  distress HEENT:       Eyes: PERRL, EOMI, no scleral icterus.       ENT: No discharge from the ears and nose, no pharynx injection, no tonsillar enlargement.        Neck: No JVD, no bruit, no mass felt. Heme: No neck lymph node enlargement. Cardiac: S1/S2, RRR, No murmurs, No gallops or rubs. Pulm:  No rales, wheezing, rhonchi or rubs. Abd: Soft, nondistended, nontender, no rebound pain, no organomegaly, BS present. Ext: No pitting leg edema bilaterally. 2+DP/PT pulse bilaterally. Musculoskeletal: No joint deformities, No joint redness or warmth, no limitation of ROM in spin. Skin: No rashes.  Neuro: Alert, oriented X3, cranial nerves II-XII grossly intact, muscle strength 5/5 in all extremities, sensation to light touch intact.  Psych: Patient is not psychotic, no suicidal or hemocidal ideation.  Labs on Admission:  Basic Metabolic Panel:  Recent Labs Lab 10/24/15 1700  NA 143  K 3.9  CL 109  CO2 22  GLUCOSE 79  BUN 13  CREATININE 0.83  CALCIUM 9.4   Liver Function Tests:  Recent Labs Lab 10/24/15 1700  AST 40  ALT 35  ALKPHOS 152*  BILITOT 0.7  PROT 7.5  ALBUMIN 3.5   No results for input(s): LIPASE, AMYLASE in the last 168 hours. No results for input(s): AMMONIA in the last 168 hours. CBC:  Recent Labs Lab 10/24/15 1700  WBC 9.0  HGB 11.9*  HCT 35.9*  MCV 93.7  PLT PLATELET CLUMPS NOTED ON SMEAR, COUNT APPEARS ADEQUATE   Cardiac Enzymes:  Recent Labs Lab 10/24/15 1700 10/24/15 2035  TROPONINI 0.10* 0.11*    BNP (last 3 results) No results for input(s): BNP in the last 8760 hours.  ProBNP (last 3 results) No results for input(s): PROBNP in the last 8760 hours.  CBG: No results for input(s): GLUCAP in the last 168 hours.  Radiological Exams on Admission: Dg Chest 2 View  10/24/2015  CLINICAL DATA:  Chest pain EXAM: CHEST  2 VIEW COMPARISON:  05/29/2014 chest radiograph. FINDINGS: Stable cardiomediastinal silhouette with normal heart size. No  pneumothorax. No pleural effusion. Clear lungs, with no focal lung consolidation and no pulmonary edema. IMPRESSION: No active cardiopulmonary disease. Electronically Signed   By: Ilona Sorrel M.D.   On: 10/24/2015 16:15    EKG: Independently reviewed. QTC 441, LAD, slightly ST elevation in V1-V2, Q waves in V1-V2, which is similar to previous EKG on 05/29/14.  Assessment/Plan Principal Problem:   Chest pain Active Problems:   Stroke [facility])   Hypertensive urgency   NSTEMI (non-ST elevated myocardial infarction) (HCC)   Diarrhea   Elevated troponin   GERD (gastroesophageal reflux disease)   Hypothyroidism  Chest pain and NSTEMI: Likely due to demanding ischemia secondary to hypertensive emergency. Her POC troponin was elevated mildly at begining, then normalized on the repeated troponin, but started increasing on the third repeat at 0.11, now concernong for ACS. No pneumonia on chest x-ray. Currently no chest pain or shortness of breath, less likely to have PE. Cardiology was consulted.  - will admit to Tele bed  - Appreciate cardiology's consultation. -  cycle CE q6 x3 and repeat her EKG in the am  - Nitroglycerin, Morphine, and aspirin, lipitor  - Risk factor stratification: will check FLP and A1C  - 2d echo - Start heparin gtt (when Dr. Stanford Breed saw patient, patient was chest pain-free and her troponin was normalized in that time, now troponin started increasing)  Hypertensive urgency: -start Lisinopril 10 mg daily -IV hydralazine when necessary  Stroke [facility]): No acute new issues -ASA  GERD: -Pepcid  Diarrhea: Most likely due to viral enteritis, but need to rule out other possibilities, such as c dif colitis. -IV fluid: 1 L normal saline then 100 mL per hour -check c diff pcr  Hypothyroidism: Last TSH was not on record -Continue home Synthroid -Check TSH  DVT ppx: SQ Heparin    Code Status: Full code Family Communication: Yes, patient's friends at bed side Disposition  Plan: Admit to inpatient   Date of Service 10/24/2015    Ivor Costa [facility] Pager [phone]  If 7PM-7AM, please contact night-coverage [email or web address] Password [facility] 10/24/2015, 11:51 PM

## 2015-10-24 NOTE — Consult Note (Signed)
CARDIOLOGY INPATIENT CONSULTATION NOTE  Patient ID: Mary Dalton MRN: [id number], DOB/AGE: 1956-03-08   Admit date: 10/24/2015   Primary Physician: No PCP Per Patient Primary Cardiologist: none  Reason for Consult:   Elevated troponin and chest pain  Requesting Physician: Ivor Costa MD  HPI: This is a 59 y.o. female with no known history of CAD but has hypertension who presented with an episode of chest pain and SOB. Patient is admitted under triad service and is being managed for eelvated blood pressure.  Patient was in his usual state of health when today around lunch time she had an episode of chest pain. The chest pain was pressure like, substernal in location, relieved on its own, did not radiate, 10/10 in intensity, was associated with SOB and diaphoresis. This has been occuring for sometime and usually occurs at its work with exertion. However this chest pain did not occur with exertion and occurred at rest. 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 She has SOB and mild chest pains with lifting those children sometimes but she ignores that. These symptoms have been going on for the last 1 year and she has been ignoring them. On arrival, her BP was severely elevated with systolic [id number]. She has not been taking any BP meds. She said that years ago she had hypertension but she stopped taking meds on her own and she was doing well. She had [employer] beef steak yesterday and usually eats outside at restaurants.  Her current vitals are: BP 185/74 mmHg  Pulse 73  Temp(Src) 97.6 F (36.4 C) (Oral)  Resp 24  Ht 5\' 6"  (1.676 m)  Wt 92.987 kg (205 lb)  BMI 33.10 kg/m2  SpO2 99% her initial troponin was elevated to 0.1 which decreased afterwards.    Problem List: Past Medical History  Diagnosis Date  . Stroke ([place])   . Hypertension   . GERD (gastroesophageal reflux disease)     History reviewed. No pertinent past surgical history.   Allergies: No Known  Allergies   Home Medications Current Facility-Administered Medications  Medication Dose Route Frequency Provider Last Rate Last Dose  . 0.9 %  sodium chloride infusion   Intravenous Continuous Ivor Costa, MD      . acetaminophen (TYLENOL) tablet 650 mg  650 mg Oral Q6H PRN Ivor Costa, MD       Or  . acetaminophen (TYLENOL) suppository 650 mg  650 mg Rectal Q6H PRN Ivor Costa, MD      . aspirin chewable tablet 324 mg  324 mg Oral Daily Ivor Costa, MD   324 mg at 10/24/15 2015  . famotidine (PEPCID) tablet 20 mg  20 mg Oral Daily Ivor Costa, MD   20 mg at 10/24/15 2113  . heparin injection 5,000 Units  5,000 Units Subcutaneous 3 times per day Ivor Costa, MD   5,000 Units at 10/24/15 2113  . hydrALAZINE (APRESOLINE) injection 5 mg  5 mg Intravenous Q2H PRN Ivor Costa, MD   5 mg at 10/24/15 2113  . [START ON 10/25/2015] levothyroxine (SYNTHROID, LEVOTHROID) tablet 112 mcg  112 mcg Oral QAC breakfast Ivor Costa, MD      . lisinopril (PRINIVIL,ZESTRIL) tablet 10 mg  10 mg Oral Daily Ivor Costa, MD   10 mg at 10/24/15 2112  . morphine 2 MG/ML injection 2 mg  2 mg Intravenous Q4H PRN Ivor Costa, MD      . nitroGLYCERIN (NITROSTAT) SL tablet 0.4 mg  0.4 mg Sublingual  Q5 min PRN Ivor Costa, MD      . ondansetron [facility]) injection 4 mg  4 mg Intravenous Q8H PRN Ivor Costa, MD      . sodium chloride 0.9 % injection 3 mL  3 mL Intravenous Q12H Ivor Costa, MD   3 mL at 10/24/15 2113     History reviewed. No pertinent family history.   Social History   Social History  . Marital Status: Legally Separated    Spouse Name: N/A  . Number of Children: N/A  . Years of Education: N/A   Occupational History  . Not on file.   Social History Main Topics  . Smoking status: Never Smoker   . Smokeless tobacco: Not on file  . Alcohol Use: No  . Drug Use: No  . Sexual Activity: Not on file   Other Topics Concern  . Not on file   Social History Narrative     Review of Systems: General: fatigue negative for  chills, fever, night sweats  Cardiovascular: occasional leg swelling, dyspnea, chest pain  Dermatological: negative for rash Respiratory: negative for wheezing and cough, denied smoking Urologic: negative for hematuria Abdominal: negative for nausea, vomiting, diarrhea, bright red blood per rectum, melena, or hematemesis Neurologic: negative for visual changes, syncope, or dizziness Hematology: no bleeding diathesis Psychiatry: non suicidal/homicidal  Musculoskeletal: back pain   Physical Exam: Vitals: BP 185/74 mmHg  Pulse 73  Temp(Src) 97.6 F (36.4 C) (Oral)  Resp 24  Ht 5\' 6"  (1.676 m)  Wt 92.987 kg (205 lb)  BMI 33.10 kg/m2  SpO2 99% General: not in acute distress Neck: JVP flat, neck supple Heart: regular rate and rhythm, S1, S2, no murmurs  Lungs: CTAB  GI: non tender, non distended, bowel sounds present Extremities: no edema Neuro: AAO x 3  Psych: normal affect, no anxiety   Labs:   Results for orders placed or performed during the hospital encounter of 10/24/15 (from the past 24 hour(s))  Urinalysis, Routine w reflex microscopic (not at [facility])     Status: Abnormal   Collection Time: 10/24/15  4:39 PM  Result Value Ref Range   Color, Urine YELLOW YELLOW   APPearance CLOUDY (A) CLEAR   Specific Gravity, Urine 1.021 1.005 - 1.030   pH 5.0 5.0 - 8.0   Glucose, UA NEGATIVE NEGATIVE mg/dL   Hgb urine dipstick TRACE (A) NEGATIVE   Bilirubin Urine NEGATIVE NEGATIVE   Ketones, ur NEGATIVE NEGATIVE mg/dL   Protein, ur NEGATIVE NEGATIVE mg/dL   Nitrite NEGATIVE NEGATIVE   Leukocytes, UA TRACE (A) NEGATIVE  Urine microscopic-add on     Status: Abnormal   Collection Time: 10/24/15  4:39 PM  Result Value Ref Range   Squamous Epithelial / LPF 6-30 (A) NONE SEEN   WBC, UA 0-5 0 - 5 WBC/hpf   RBC / HPF 0-5 0 - 5 RBC/hpf   Bacteria, UA MANY (A) NONE SEEN   Casts HYALINE CASTS (A) NEGATIVE   Urine-Other MUCOUS PRESENT   CBC     Status: Abnormal   Collection Time:  10/24/15  5:00 PM  Result Value Ref Range   WBC 9.0 4.0 - 10.5 K/uL   RBC 3.83 (L) 3.87 - 5.11 MIL/uL   Hemoglobin 11.9 (L) 12.0 - 15.0 g/dL   HCT 35.9 (L) 36.0 - 46.0 %   MCV 93.7 78.0 - 100.0 fL   MCH 31.1 26.0 - 34.0 pg   MCHC 33.1 30.0 - 36.0 g/dL   RDW 14.4 11.5 -  15.5 %   Platelets  150 - 400 K/uL    PLATELET CLUMPS NOTED ON SMEAR, COUNT APPEARS ADEQUATE  Troponin I     Status: Abnormal   Collection Time: 10/24/15  5:00 PM  Result Value Ref Range   Troponin I 0.10 (H) <0.031 ng/mL  Comprehensive metabolic panel     Status: Abnormal   Collection Time: 10/24/15  5:00 PM  Result Value Ref Range   Sodium 143 135 - 145 mmol/L   Potassium 3.9 3.5 - 5.1 mmol/L   Chloride 109 101 - 111 mmol/L   CO2 22 22 - 32 mmol/L   Glucose, Bld 79 65 - 99 mg/dL   BUN 13 6 - 20 mg/dL   Creatinine, Ser 0.83 0.44 - 1.00 mg/dL   Calcium 9.4 8.9 - 10.3 mg/dL   Total Protein 7.5 6.5 - 8.1 g/dL   Albumin 3.5 3.5 - 5.0 g/dL   AST 40 15 - 41 U/L   ALT 35 14 - 54 U/L   Alkaline Phosphatase 152 (H) 38 - 126 U/L   Total Bilirubin 0.7 0.3 - 1.2 mg/dL   GFR calc non Af Amer >60 >60 mL/min   GFR calc Af Amer >60 >60 mL/min   Anion gap 12 5 - 15  I-Stat CG4 Lactic Acid, ED     Status: Abnormal   Collection Time: 10/24/15  5:04 PM  Result Value Ref Range   Lactic Acid, Venous 2.12 (HH) 0.5 - 2.0 mmol/L   Comment NOTIFIED PHYSICIAN   I-stat troponin, ED     Status: None   Collection Time: 10/24/15  6:43 PM  Result Value Ref Range   Troponin i, poc 0.03 0.00 - 0.08 ng/mL   Comment 3          I-Stat CG4 Lactic Acid, ED     Status: None   Collection Time: 10/24/15  6:45 PM  Result Value Ref Range   Lactic Acid, Venous 1.71 0.5 - 2.0 mmol/L  Protime-INR     Status: None   Collection Time: 10/24/15  8:35 PM  Result Value Ref Range   Prothrombin Time 14.7 11.6 - 15.2 seconds   INR 1.14 0.00 - 1.49  APTT     Status: None   Collection Time: 10/24/15  8:35 PM  Result Value Ref Range   aPTT 29 24 - 37  seconds     Radiology/Studies: Dg Chest 2 View  10/24/2015  CLINICAL DATA:  Chest pain EXAM: CHEST  2 VIEW COMPARISON:  05/29/2014 chest radiograph. FINDINGS: Stable cardiomediastinal silhouette with normal heart size. No pneumothorax. No pleural effusion. Clear lungs, with no focal lung consolidation and no pulmonary edema. IMPRESSION: No active cardiopulmonary disease. Electronically Signed   By: Ilona Sorrel M.D.   On: 10/24/2015 16:15    EKG: normal sinus rhythm 61 bpm, septal Q waves suggestive of septal infarct unchanged from 05/2014, left axis deviation, T wave inversion and LVH and left atrial enlargement  Echo: pending  Cardiac cath: pending  Medical decision making:  Discussed care with the patient Discussed care with the triad physician  Reviewed labs and imaging personally Reviewed prior records  ASSESSMENT AND PLAN:  This is a 59 y.o. female with no known history of CAD and hypertension, LVH by EKG, prior stroke and elevated troponin who presented with chest pain at rest with elevated BP.   Principal Problem:   Chest pain Active Problems:   Stroke [facility])   Hypertensive urgency   NSTEMI (non-ST  elevated myocardial infarction) (HCC)   Diarrhea   Elevated troponin   GERD (gastroesophageal reflux disease)  Chest pain - atypical chest pain, at rest with elevated troponin in the setting of elevated BP which improved with blood pressure control. With LVH by EKG Chest pain and symptoms of exertional SOB/chest pain are likely angina in the setting of risk factors - cycle troponin, daily aspirin, high dose statin and echocardiogram in the AM with lipid profile, hba1c - consider BP control and then nuclear stress test   Hypertensive emergency - control BP, consider a diuretic and ACEi   Signed, Flossie Dibble, MD MS 10/24/2015, 9:17 PM

## 2015-10-24 NOTE — Consult Note (Signed)
ANTICOAGULATION CONSULT NOTE - Initial Consult  Pharmacy Consult for Lovenox Indication: chest pain/ACS  No Known Allergies  Patient Measurements: Height: 5\' 6"  (167.6 cm) Weight: 205 lb (92.987 kg) IBW/kg (Calculated) : 59.3  Vital Signs: Temp: 97.6 F (36.4 C) (11/20 1433) Temp Source: Oral (11/20 1433) BP: 159/80 mmHg (11/20 1745) Pulse Rate: 67 (11/20 1745)  Labs:  Recent Labs  10/24/15 1700  HGB 11.9*  HCT 35.9*  PLT PLATELET CLUMPS NOTED ON SMEAR, COUNT APPEARS ADEQUATE  CREATININE 0.83  TROPONINI 0.10*    Estimated Creatinine Clearance: 81.8 mL/min (by C-G formula based on Cr of 0.83).   Medical History: Past Medical History  Diagnosis Date  . Stroke ([place])   . Hypertension    Assessment: 59yof presents to the ED with chest pain, SOB, and diaphoresis. Initial troponin mildly elevated at 0.10. She will begin therapeutic lovenox for NSTEMI. Weight = 93kg. Renal function stable.  Goal of Therapy:  Anti-Xa level 0.6-1 units/ml 4hrs after LMWH dose given Monitor platelets by anticoagulation protocol: Yes   Plan:  1) Lovenox 95kg sq q12 (1mg /kg/q12) 2) CBC q72h  Deboraha Sprang 10/24/2015,7:00 PM

## 2015-10-24 NOTE — ED Notes (Signed)
Ordered heart healthy meal tray for pt. 

## 2015-10-25 ENCOUNTER — Ambulatory Visit (HOSPITAL_COMMUNITY): Payer: 59

## 2015-10-25 DIAGNOSIS — E038 Other specified hypothyroidism: Secondary | ICD-10-CM

## 2015-10-25 DIAGNOSIS — I214 Non-ST elevation (NSTEMI) myocardial infarction: Principal | ICD-10-CM

## 2015-10-25 DIAGNOSIS — I160 Hypertensive urgency: Secondary | ICD-10-CM

## 2015-10-25 DIAGNOSIS — I6300 Cerebral infarction due to thrombosis of unspecified precerebral artery: Secondary | ICD-10-CM

## 2015-10-25 DIAGNOSIS — R079 Chest pain, unspecified: Secondary | ICD-10-CM

## 2015-10-25 DIAGNOSIS — I10 Essential (primary) hypertension: Secondary | ICD-10-CM

## 2015-10-25 LAB — PROTIME-INR
INR: 1.18 (ref 0.00–1.49)
PROTHROMBIN TIME: 15.2 s (ref 11.6–15.2)

## 2015-10-25 LAB — C DIFFICILE QUICK SCREEN W PCR REFLEX
C DIFFICILE (CDIFF) INTERP: NEGATIVE
C Diff antigen: NEGATIVE
C Diff toxin: NEGATIVE

## 2015-10-25 LAB — CBC
HEMATOCRIT: 32.3 % — AB (ref 36.0–46.0)
HEMOGLOBIN: 10.3 g/dL — AB (ref 12.0–15.0)
MCH: 30.3 pg (ref 26.0–34.0)
MCHC: 31.9 g/dL (ref 30.0–36.0)
MCV: 95 fL (ref 78.0–100.0)
Platelets: 366 10*3/uL (ref 150–400)
RBC: 3.4 MIL/uL — AB (ref 3.87–5.11)
RDW: 14.5 % (ref 11.5–15.5)
WBC: 6.7 10*3/uL (ref 4.0–10.5)

## 2015-10-25 LAB — TSH: TSH: 1.223 u[IU]/mL (ref 0.350–4.500)

## 2015-10-25 LAB — BASIC METABOLIC PANEL
ANION GAP: 10 (ref 5–15)
BUN: 13 mg/dL (ref 6–20)
CALCIUM: 8.8 mg/dL — AB (ref 8.9–10.3)
CO2: 23 mmol/L (ref 22–32)
Chloride: 110 mmol/L (ref 101–111)
Creatinine, Ser: 0.72 mg/dL (ref 0.44–1.00)
GFR calc Af Amer: >60 mL/min (ref >60)
Glucose, Bld: 101 mg/dL — ABNORMAL HIGH (ref 65–99)
POTASSIUM: 3.3 mmol/L — AB (ref 3.5–5.1)
SODIUM: 143 mmol/L (ref 135–145)

## 2015-10-25 LAB — GLUCOSE, CAPILLARY: GLUCOSE-CAPILLARY: 84 mg/dL (ref 65–99)

## 2015-10-25 LAB — TROPONIN I
TROPONIN I: 0.1 ng/mL — AB (ref <0.031)
TROPONIN I: 0.1 ng/mL — AB (ref <0.031)

## 2015-10-25 LAB — RAPID URINE DRUG SCREEN, HOSP PERFORMED
Amphetamines: NOT DETECTED
BENZODIAZEPINES: NOT DETECTED
Barbiturates: NOT DETECTED
COCAINE: NOT DETECTED
Opiates: NOT DETECTED
Tetrahydrocannabinol: NOT DETECTED

## 2015-10-25 LAB — LIPID PANEL
Cholesterol: 141 mg/dL (ref 0–200)
HDL: 33 mg/dL — ABNORMAL LOW (ref >40)
LDL CALC: 92 mg/dL (ref 0–99)
Total CHOL/HDL Ratio: 4.3 RATIO
Triglycerides: 78 mg/dL (ref <150)
VLDL: 16 mg/dL (ref 0–40)

## 2015-10-25 LAB — HEPARIN LEVEL (UNFRACTIONATED)
HEPARIN UNFRACTIONATED: 0.32 [IU]/mL (ref 0.30–0.70)
Heparin Unfractionated: 0.45 IU/mL (ref 0.30–0.70)

## 2015-10-25 MED ORDER — ASPIRIN 81 MG PO CHEW
81.0000 mg | CHEWABLE_TABLET | ORAL | Status: AC
  Administered 2015-10-26: 81 mg via ORAL
  Filled 2015-10-25: qty 1

## 2015-10-25 MED ORDER — POTASSIUM CHLORIDE CRYS ER 20 MEQ PO TBCR: 40.0000 meq | EXTENDED_RELEASE_TABLET | Freq: Once | ORAL | Status: DC

## 2015-10-25 MED ORDER — SODIUM CHLORIDE 0.9 % IJ SOLN: 3.0000 mL | INTRAMUSCULAR | Status: DC | PRN

## 2015-10-25 MED ORDER — SODIUM CHLORIDE 0.9 % IV SOLN
INTRAVENOUS | Status: DC
  Administered 2015-10-25: 11:00:00 via INTRAVENOUS

## 2015-10-25 MED ORDER — LOPERAMIDE HCL 2 MG PO CAPS: 2.0000 mg | ORAL_CAPSULE | Freq: Four times a day (QID) | ORAL | Status: DC | PRN

## 2015-10-25 MED ORDER — SODIUM CHLORIDE 0.9 % WEIGHT BASED INFUSION
1.0000 mL/kg/h | INTRAVENOUS | Status: DC
  Administered 2015-10-26: 1 mL/kg/h via INTRAVENOUS

## 2015-10-25 MED ORDER — SODIUM CHLORIDE 0.9 % IV SOLN: 250.0000 mL | INTRAVENOUS | Status: DC | PRN

## 2015-10-25 MED ORDER — SODIUM CHLORIDE 0.9 % IJ SOLN: 3.0000 mL | Freq: Two times a day (BID) | INTRAMUSCULAR | Status: DC

## 2015-10-25 MED ORDER — POTASSIUM CHLORIDE CRYS ER 20 MEQ PO TBCR
40.0000 meq | EXTENDED_RELEASE_TABLET | Freq: Once | ORAL | Status: AC
  Administered 2015-10-25: 40 meq via ORAL
  Filled 2015-10-25: qty 2

## 2015-10-25 MED ORDER — METOPROLOL TARTRATE 25 MG PO TABS
25.0000 mg | ORAL_TABLET | Freq: Two times a day (BID) | ORAL | Status: DC
  Administered 2015-10-25 – 2015-10-26 (×3): 25 mg via ORAL
  Filled 2015-10-25 (×3): qty 1

## 2015-10-25 NOTE — Care Management Note (Addendum)
Case Management Note  Patient Details  Name: Mary Dalton MRN: [id number] Date of Birth: [birth year]  Subjective/Objective: Pt admitted for chest pain. Plan for cardiac cath 10-26-15.                     Action/Plan: CM will continue to monitor for disposition needs.    Expected Discharge Date:                  Expected Discharge Plan:  Home/Self Care  In-House Referral:     Discharge planning Services  CM Consult  Post Acute Care Choice:    Choice offered to:     DME Arranged:    DME Agency:     HH Arranged:    HH Agency:     Status of Service:  In process, will continue to follow  Medicare Important Message Given:    Date Medicare IM Given:    Medicare IM give by:    Date Additional Medicare IM Given:    Additional Medicare Important Message give by:     If discussed at [place] of Stay Meetings, dates discussed:    Additional Comments:  Bethena Roys, RN 10/25/2015, 2:17 PM

## 2015-10-25 NOTE — Progress Notes (Signed)
Patient Demographics:    Mary Dalton, is a 59 y.o. female, DOB - [birth year], [id number]  Admit date - 10/24/2015   Admitting Physician Ivor Costa, MD  Outpatient Primary MD for the patient is No PCP Per Patient  LOS - 1   Chief Complaint  Patient presents with  . Weakness        Subjective:    Mary Dalton today has, No headache, No chest pain, No abdominal pain - No Nausea, No new weakness tingling or numbness, No Cough - SOB.     Assessment  & Plan :     1.NSTEMI - chest pain free now, cardiology following, currently on heparin drip, aspirin, statin will add low-dose beta blocker if heart rate allows. Echo pending. Further workup per cardiology.   2. Hypertension. Stable on ACE inhibitor will add low-dose beta blocker if heart rate allows.   3. Dyslipidemia. Placed on statin.   4. Hypothyroidism. On Synthroid continue TSH stable.   5. History of stroke. Continue aspirin and statin for secondary prevention no acute issues.    Code Status : Full  Family Communication  : None present  Disposition Plan  : Home 1-2 days  Consults  :  Cards  Procedures  :   TTE  DVT Prophylaxis  :   Heparin    Lab Results  Component Value Date   PLT 366 10/25/2015    Inpatient Medications  Scheduled Meds: . aspirin  324 mg Oral Daily  . atorvastatin  40 mg Oral q1800  . famotidine  20 mg Oral Daily  . levothyroxine  112 mcg Oral QAC breakfast  . lisinopril  10 mg Oral Daily  . sodium chloride  3 mL Intravenous Q12H   Continuous Infusions: . sodium chloride 100 mL/hr at 10/25/15 0006  . heparin 1,500 Units/hr (10/25/15 0006)   PRN Meds:.acetaminophen **OR** [DISCONTINUED] acetaminophen, hydrALAZINE, morphine injection, nitroGLYCERIN, ondansetron  Antibiotics  :      Anti-infectives    None        Objective:   Filed Vitals:   10/24/15 2055 10/24/15 2311 10/24/15 2320 10/25/15 0506  BP: 185/74 141/62 145/54 140/70  Pulse: 68  68 61  Temp: 98.6 F (37 C)  98.3 F (36.8 C) 97.6 F (36.4 C)  TempSrc: Oral  Oral Oral  Resp: 20  21   Height: 5\' 6"  (1.676 m)     Weight: 110.859 kg (244 lb 6.4 oz)     SpO2: 97%  98% 100%    Wt Readings from Last 3 Encounters:  10/24/15 110.859 kg (244 lb 6.4 oz)     Intake/Output Summary (Last 24 hours) at 10/25/15 0957 Last data filed at 10/25/15 0530  Gross per 24 hour  Intake    585 ml  Output     75 ml  Net    510 ml     Physical Exam  Awake Alert, Oriented X 3, No new F.N deficits, Normal affect Lynn.AT,PERRAL Supple Neck,No JVD, No cervical lymphadenopathy appriciated.  Symmetrical Chest wall movement, Good air movement bilaterally, CTAB RRR,No Gallops,Rubs or new Murmurs, No Parasternal Heave +ve B.Sounds, Abd Soft, No tenderness, No organomegaly appriciated, No rebound - guarding or rigidity. No Cyanosis, Clubbing or edema, No new Rash  or bruise       Data Review:   Micro Results No results found for this or any previous visit (from the past 240 hour(s)).  Radiology Reports Dg Chest 2 View  10/24/2015  CLINICAL DATA:  Chest pain EXAM: CHEST  2 VIEW COMPARISON:  05/29/2014 chest radiograph. FINDINGS: Stable cardiomediastinal silhouette with normal heart size. No pneumothorax. No pleural effusion. Clear lungs, with no focal lung consolidation and no pulmonary edema. IMPRESSION: No active cardiopulmonary disease. Electronically Signed   By: Ilona Sorrel M.D.   On: 10/24/2015 16:15     CBC  Recent Labs Lab 10/24/15 1700 10/25/15 0830  WBC 9.0 6.7  HGB 11.9* 10.3*  HCT 35.9* 32.3*  PLT PLATELET CLUMPS NOTED ON SMEAR, COUNT APPEARS ADEQUATE 366  MCV 93.7 95.0  MCH 31.1 30.3  MCHC 33.1 31.9  RDW 14.4 14.5    Chemistries   Recent Labs Lab 10/24/15 1700 10/25/15 0247  NA  143 143  K 3.9 3.3*  CL 109 110  CO2 22 23  GLUCOSE 79 101*  BUN 13 13  CREATININE 0.83 0.72  CALCIUM 9.4 8.8*  AST 40  --   ALT 35  --   ALKPHOS 152*  --   BILITOT 0.7  --    ------------------------------------------------------------------------------------------------------------------ estimated creatinine clearance is 93.1 mL/min (by C-G formula based on Cr of 0.72). ------------------------------------------------------------------------------------------------------------------ No results for input(s): HGBA1C in the last 72 hours. ------------------------------------------------------------------------------------------------------------------  Recent Labs  10/25/15 0247  CHOL 141  HDL 33*  LDLCALC 92  TRIG 78  CHOLHDL 4.3   ------------------------------------------------------------------------------------------------------------------  Recent Labs  10/25/15 0247  TSH 1.223   ------------------------------------------------------------------------------------------------------------------ No results for input(s): VITAMINB12, FOLATE, FERRITIN, TIBC, IRON, RETICCTPCT in the last 72 hours.  Coagulation profile  Recent Labs Lab 10/24/15 2035  INR 1.14    No results for input(s): DDIMER in the last 72 hours.  Cardiac Enzymes  Recent Labs Lab 10/24/15 2035 10/25/15 0247 10/25/15 0830  TROPONINI 0.11* 0.10* 0.10*   ------------------------------------------------------------------------------------------------------------------ Invalid input(s): POCBNP   Time Spent in minutes  35   Shayann Garbutt K M.D on 10/25/2015 at 9:57 AM  Between 7am to 7pm - Pager - [phone]  After 7pm go to [email or web address] - password [facility]  [facility] -  Office  [phone]

## 2015-10-25 NOTE — Evaluation (Signed)
Physical Therapy Evaluation and Discharge Patient Details Name: Mary Dalton MRN: [id number] DOB: 06/14/1956 Today's Date: 10/25/2015   History of Present Illness  Pt is a 59 y/o F who presented after episode of chest pain.  Her PMH includes stroke, NSTEMI.  Clinical Impression  Pt admitted with above diagnosis. No skilled PT needs identified at this time.  Pt would benefit from cardiopulmonary rehab to address pt's general deconditioning.  PT is signing off.    Follow Up Recommendations No PT follow up    Equipment Recommendations  None recommended by PT    Recommendations for Other Services  (Cardiopulmonary rehab (deconditioning))     Precautions / Restrictions Restrictions Weight Bearing Restrictions: No      Mobility  Bed Mobility Overal bed mobility: Independent                Transfers Overall transfer level: Independent Equipment used: None                Ambulation/Gait Ambulation/Gait assistance: Modified independent (Device/Increase time) Ambulation Distance (Feet): 150 Feet Assistive device: None Gait Pattern/deviations: Step-through pattern   Gait velocity interpretation: at or above normal speed for age/gender General Gait Details: Pt requires standing rest break x1 2/2 generalized fatigue.  Stairs            Wheelchair Mobility    Modified Rankin (Stroke Patients Only)       Balance Overall balance assessment: Modified Independent                                           Pertinent Vitals/Pain Pain Assessment: No/denies pain    Home Living Family/patient expects to be discharged to:: Private residence Living Arrangements: Alone Available Help at Discharge: Family;Friend(s);Available 24 hours/day (Family & church friends available 24/7 if necessary) Type of Home: House Home Access: Level entry     Home Layout: One level Home Equipment: None      Prior Function Level of Independence:  Independent               Hand Dominance        Extremity/Trunk Assessment   Upper Extremity Assessment: Defer to OT evaluation;Overall WFL for tasks assessed           Lower Extremity Assessment: LLE deficits/detail   LLE Deficits / Details: pt reports h/o Lt knee osteoarthritis     Communication   Communication: No difficulties  Cognition Arousal/Alertness: Awake/alert Behavior During Therapy: WFL for tasks assessed/performed Overall Cognitive Status: Within Functional Limits for tasks assessed                      General Comments      Exercises        Assessment/Plan    PT Assessment Patent does not need any further PT services  PT Diagnosis Difficulty walking   PT Problem List    PT Treatment Interventions     PT Goals (Current goals can be found in the Care Plan section) Acute Rehab PT Goals Patient Stated Goal: to figure out what's going on and to go home PT Goal Formulation: All assessment and education complete, DC therapy    Frequency     Barriers to discharge        Co-evaluation               End of Session  Equipment Utilized During Treatment: Gait belt Activity Tolerance: Patient limited by fatigue Patient left: in chair;with call bell/phone within reach Nurse Communication: Mobility status         Time: [id number] PT Time Calculation (min) (ACUTE ONLY): 16 min   Charges:   PT Evaluation $Initial PT Evaluation Tier I: 1 Procedure     PT G CodesJoslyn Hy PT, DPT [phone] Pager: [phone] 10/25/2015, 2:09 PM

## 2015-10-25 NOTE — Progress Notes (Signed)
UR Completed Shanyla Marconi Graves-Bigelow, RN,BSN [phone]  

## 2015-10-25 NOTE — Progress Notes (Signed)
ANTICOAGULATION CONSULT NOTE - Follow Up Consult  Pharmacy Consult for Heparin Indication: chest pain/ACS  No Known Allergies  Patient Measurements: Height: 5\' 6"  (167.6 cm) Weight: 245 lb 4.8 oz (111.267 kg) IBW/kg (Calculated) : 59.3 Heparin Dosing Weight: 86 kg  Vital Signs: Temp: 97.6 F (36.4 C) (11/21 0506) Temp Source: Oral (11/21 0506) BP: 140/70 mmHg (11/21 0506) Pulse Rate: 61 (11/21 0506)  Labs:  Recent Labs  10/24/15 1700 10/24/15 2035 10/25/15 0247 10/25/15 0830  HGB 11.9*  --   --  10.3*  HCT 35.9*  --   --  32.3*  PLT PLATELET CLUMPS NOTED ON SMEAR, COUNT APPEARS ADEQUATE  --   --  366  APTT  --  29  --   --   LABPROT  --  14.7  --   --   INR  --  1.14  --   --   HEPARINUNFRC  --   --   --  0.32  CREATININE 0.83  --  0.72  --   TROPONINI 0.10* 0.11* 0.10* 0.10*    Estimated Creatinine Clearance: 93.4 mL/min (by C-G formula based on Cr of 0.72).  Assessment:   Initial heparin level on 1500 units/hr is low therapeutic (0.32).  No bolus given due to SQ heparin dose given just prior to change to IV heparin.   Cath versus stress test planned for 11/22.  Echo planned for today.    Goal of Therapy:  Heparin level 0.3-0.7 units/ml Monitor platelets by anticoagulation protocol: Yes   Plan:   Increase heparin drip to 1600 units/hr, to try to keep in target range.  Heparin level ~ 6 hrs after increase.  Daily heparin level and CBC.  Arty Baumgartner, [place] Pager: [phone] 10/25/2015,10:02 AM

## 2015-10-25 NOTE — Progress Notes (Addendum)
       Patient Name: Mary Dalton Date of Encounter: 10/25/2015    SUBJECTIVE: Feels well this morning. She is leery about having invasive testing performed. She denies chest discomfort and dyspnea. She wants to eat.  TELEMETRY:  Normal sinus rhythm Filed Vitals:   10/24/15 2055 10/24/15 2311 10/24/15 2320 10/25/15 0506  BP: 185/74 141/62 145/54 140/70  Pulse: 68  68 61  Temp: 98.6 F (37 C)  98.3 F (36.8 C) 97.6 F (36.4 C)  TempSrc: Oral  Oral Oral  Resp: 20  21   Height: 5\' 6"  (1.676 m)     Weight: 110.859 kg (244 lb 6.4 oz)     SpO2: 97%  98% 100%    Intake/Output Summary (Last 24 hours) at 10/25/15 0919 Last data filed at 10/25/15 0530  Gross per 24 hour  Intake    585 ml  Output     75 ml  Net    510 ml   LABS: Basic Metabolic Panel:  Recent Labs  10/24/15 1700 10/25/15 0247  NA 143 143  K 3.9 3.3*  CL 109 110  CO2 22 23  GLUCOSE 79 101*  BUN 13 13  CREATININE 0.83 0.72  CALCIUM 9.4 8.8*   CBC:  Recent Labs  10/24/15 1700  WBC 9.0  HGB 11.9*  HCT 35.9*  MCV 93.7  PLT PLATELET CLUMPS NOTED ON SMEAR, COUNT APPEARS ADEQUATE   Cardiac Enzymes:  Recent Labs  10/24/15 1700 10/24/15 2035 10/25/15 0247  TROPONINI 0.10* 0.11* 0.10*   BNP: Invalid input(s): POCBNP Hemoglobin A1C: No results for input(s): HGBA1C in the last 72 hours. Fasting Lipid Panel:  Recent Labs  10/25/15 0247  CHOL 141  HDL 33*  LDLCALC 92  TRIG 78  CHOLHDL 4.3    Radiology/Studies:  Chest x-ray reveals no acute abnormality  Physical Exam: Blood pressure 140/70, pulse 61, temperature 97.6 F (36.4 C), temperature source Oral, resp. rate 21, height 5\' 6"  (1.676 m), weight 110.859 kg (244 lb 6.4 oz), SpO2 100 %. Weight change:   Wt Readings from Last 3 Encounters:  10/24/15 110.859 kg (244 lb 6.4 oz)    An S4 gallop is heard on auscultation. Neck veins cannot be assessed due to neck morphology Chest is clear to auscultation and  percussion  ASSESSMENT:  1. Elevated troponin in the setting of nausea, vague chest discomfort, and significant cardiovascular risk factors for CAD 2. Hypertension is improved. There is a prior history of hypertension but on no medications at admission. 3. Near-syncope with features that suggest a neurally mediated mechanism. Rule out right coronary/circumflex coronary disease with Bezold Jarisch reflex.   Plan:   Echocardiogram will be performed today.  Nuclear stress test versus coronary angiogram tomorrow. We'll tentatively place her name on the cath schedule and keep nothing by mouth after midnight.  Demetrios Isaacs 10/25/2015, 9:19 AM

## 2015-10-25 NOTE — Progress Notes (Signed)
  Echocardiogram 2D Echocardiogram has been performed.  Jennette Dubin 10/25/2015, 12:03 PM

## 2015-10-25 NOTE — Progress Notes (Signed)
ANTICOAGULATION CONSULT NOTE - Follow Up Consult  Pharmacy Consult for Heparin Indication: chest pain/ACS  No Known Allergies  Patient Measurements: Height: 5\' 6"  (167.6 cm) Weight: 245 lb 4.8 oz (111.267 kg) IBW/kg (Calculated) : 59.3 Heparin Dosing Weight: 86 kg  Vital Signs: Temp: 97.9 F (36.6 C) (11/21 1331) Temp Source: Oral (11/21 1331) BP: 159/72 mmHg (11/21 1331) Pulse Rate: 67 (11/21 1331)  Labs:  Recent Labs  10/24/15 1700 10/24/15 2035 10/25/15 0247 10/25/15 0830 10/25/15 1826  HGB 11.9*  --   --  10.3*  --   HCT 35.9*  --   --  32.3*  --   PLT PLATELET CLUMPS NOTED ON SMEAR, COUNT APPEARS ADEQUATE  --   --  366  --   APTT  --  29  --   --   --   LABPROT  --  14.7  --  15.2  --   INR  --  1.14  --  1.18  --   HEPARINUNFRC  --   --   --  0.32 0.45  CREATININE 0.83  --  0.72  --   --   TROPONINI 0.10* 0.11* 0.10* 0.10*  --     Estimated Creatinine Clearance: 93.4 mL/min (by C-G formula based on Cr of 0.72).  Assessment: Mary Dalton presents to the ED with CP, SOB, diaphoresis.   PMH: stroke, HTN  Anticoagulation: Heparin for NSTEMI.   Heparin drip at 1600 units/hr with repeat HL of 0.45.  Cardiovascular - HTN, but no cardiac meds pta. Has significant risk factors, but leery about invasive tests.  Echo pending. Troponins 0.10-0.11, TC 141, LDL 92   140/70, HR 61.    ASA 324, Atorva 40, Lisin   Hematology / Oncology - Hgb 11/9>10.3, PLTC clumps/appear adequate>366  Goal of Therapy:  Heparin level 0.3-0.7 units/ml Monitor platelets by anticoagulation protocol: Yes   Plan:  Continue heparin drip at 1600 units/hr Daily HL, CBC Monitor for s/sx of bleeding  Levester Fresh, PharmD, BCPS Clinical Pharmacist Pager [phone] 10/25/2015 7:30 PM

## 2015-10-26 ENCOUNTER — Encounter (HOSPITAL_COMMUNITY): Payer: Self-pay | Admitting: Interventional Cardiology

## 2015-10-26 ENCOUNTER — Encounter (HOSPITAL_COMMUNITY)
Admission: EM | Disposition: A | Discharge status: Home / Self Care | Payer: 59 | Source: Home / Self Care | Attending: Internal Medicine

## 2015-10-26 DIAGNOSIS — K219 Gastro-esophageal reflux disease without esophagitis: Secondary | ICD-10-CM

## 2015-10-26 DIAGNOSIS — I209 Angina pectoris, unspecified: Secondary | ICD-10-CM

## 2015-10-26 DIAGNOSIS — I2510 Atherosclerotic heart disease of native coronary artery without angina pectoris: Secondary | ICD-10-CM

## 2015-10-26 HISTORY — PX: CARDIAC CATHETERIZATION: SHX172

## 2015-10-26 LAB — MAGNESIUM: Magnesium: 1.8 mg/dL (ref 1.7–2.4)

## 2015-10-26 LAB — BASIC METABOLIC PANEL
Anion gap: 7 (ref 5–15)
BUN: 12 mg/dL (ref 6–20)
CO2: 24 mmol/L (ref 22–32)
Calcium: 8.7 mg/dL — ABNORMAL LOW (ref 8.9–10.3)
Chloride: 111 mmol/L (ref 101–111)
Creatinine, Ser: 0.71 mg/dL (ref 0.44–1.00)
GFR calc Af Amer: >60 mL/min (ref >60)
GFR calc non Af Amer: >60 mL/min (ref >60)
Glucose, Bld: 105 mg/dL — ABNORMAL HIGH (ref 65–99)
Potassium: 3.6 mmol/L (ref 3.5–5.1)
Sodium: 142 mmol/L (ref 135–145)

## 2015-10-26 LAB — GLUCOSE, CAPILLARY
GLUCOSE-CAPILLARY: 110 mg/dL — AB (ref 65–99)
Glucose-Capillary: 87 mg/dL (ref 65–99)

## 2015-10-26 LAB — HEMOGLOBIN A1C
Hgb A1c MFr Bld: 6.6 % — ABNORMAL HIGH (ref 4.8–5.6)
Mean Plasma Glucose: 143 mg/dL

## 2015-10-26 LAB — CBC
HCT: 32.3 % — ABNORMAL LOW (ref 36.0–46.0)
Hemoglobin: 10.5 g/dL — ABNORMAL LOW (ref 12.0–15.0)
MCH: 30.8 pg (ref 26.0–34.0)
MCHC: 32.5 g/dL (ref 30.0–36.0)
MCV: 94.7 fL (ref 78.0–100.0)
PLATELETS: 393 10*3/uL (ref 150–400)
RBC: 3.41 MIL/uL — AB (ref 3.87–5.11)
RDW: 14.6 % (ref 11.5–15.5)
WBC: 7.6 10*3/uL (ref 4.0–10.5)

## 2015-10-26 LAB — HEPARIN LEVEL (UNFRACTIONATED): HEPARIN UNFRACTIONATED: 0.4 [IU]/mL (ref 0.30–0.70)

## 2015-10-26 SURGERY — LEFT HEART CATH AND CORONARY ANGIOGRAPHY

## 2015-10-26 MED ORDER — METOPROLOL TARTRATE 25 MG PO TABS: 25.0000 mg | ORAL_TABLET | Freq: Two times a day (BID) | ORAL | Status: AC

## 2015-10-26 MED ORDER — FENTANYL CITRATE (PF) 100 MCG/2ML IJ SOLN
INTRAMUSCULAR | Status: DC | PRN
  Administered 2015-10-26 (×2): 25 ug via INTRAVENOUS

## 2015-10-26 MED ORDER — SODIUM CHLORIDE 0.9 % IJ SOLN: 3.0000 mL | Freq: Two times a day (BID) | INTRAMUSCULAR | Status: DC

## 2015-10-26 MED ORDER — IOHEXOL 350 MG/ML SOLN
INTRAVENOUS | Status: DC | PRN
  Administered 2015-10-26: 80 mL via INTRA_ARTERIAL

## 2015-10-26 MED ORDER — HYDROCHLOROTHIAZIDE 12.5 MG PO CAPS
12.5000 mg | ORAL_CAPSULE | Freq: Every day | ORAL | Status: DC
Start: 2015-10-26 — End: 2015-10-26

## 2015-10-26 MED ORDER — ONDANSETRON HCL 4 MG/2ML IJ SOLN
INTRAMUSCULAR | Status: DC | PRN
  Administered 2015-10-26: 4 mg via INTRAVENOUS

## 2015-10-26 MED ORDER — HEPARIN SODIUM (PORCINE) 1000 UNIT/ML IJ SOLN: INTRAMUSCULAR | Status: DC | PRN

## 2015-10-26 MED ORDER — SODIUM CHLORIDE 0.9 % WEIGHT BASED INFUSION: 1.0000 mL/kg/h | INTRAVENOUS | Status: AC

## 2015-10-26 MED ORDER — ASPIRIN 81 MG PO TBEC: 81.0000 mg | DELAYED_RELEASE_TABLET | Freq: Every day | ORAL | Status: DC

## 2015-10-26 MED ORDER — FENTANYL CITRATE (PF) 100 MCG/2ML IJ SOLN
INTRAMUSCULAR | Status: AC
  Filled 2015-10-26: qty 2

## 2015-10-26 MED ORDER — MIDAZOLAM HCL 2 MG/2ML IJ SOLN
INTRAMUSCULAR | Status: DC | PRN
Start: 2015-10-26 — End: 2015-10-26
  Administered 2015-10-26 (×3): 1 mg via INTRAVENOUS

## 2015-10-26 MED ORDER — ONDANSETRON HCL 4 MG/2ML IJ SOLN: 4.0000 mg | Freq: Four times a day (QID) | INTRAMUSCULAR | Status: DC | PRN

## 2015-10-26 MED ORDER — VERAPAMIL HCL 2.5 MG/ML IV SOLN
INTRAVENOUS | Status: DC | PRN
  Administered 2015-10-26: 10 mL via INTRA_ARTERIAL

## 2015-10-26 MED ORDER — LISINOPRIL 10 MG PO TABS: 10.0000 mg | ORAL_TABLET | Freq: Every day | ORAL | Status: DC

## 2015-10-26 MED ORDER — SODIUM CHLORIDE 0.9 % IV SOLN: 250.0000 mL | INTRAVENOUS | Status: DC | PRN

## 2015-10-26 MED ORDER — LIDOCAINE HCL (PF) 1 % IJ SOLN
INTRAMUSCULAR | Status: DC | PRN
  Administered 2015-10-26: 5000 mL
  Administered 2015-10-26: 11:00:00

## 2015-10-26 MED ORDER — MIDAZOLAM HCL 2 MG/2ML IJ SOLN
INTRAMUSCULAR | Status: AC
  Filled 2015-10-26: qty 2

## 2015-10-26 MED ORDER — LIDOCAINE HCL (PF) 1 % IJ SOLN
INTRAMUSCULAR | Status: AC
  Filled 2015-10-26: qty 30

## 2015-10-26 MED ORDER — ASPIRIN EC 81 MG PO TBEC: 81.0000 mg | DELAYED_RELEASE_TABLET | Freq: Every day | ORAL | Status: DC

## 2015-10-26 MED ORDER — HEPARIN SODIUM (PORCINE) 1000 UNIT/ML IJ SOLN
INTRAMUSCULAR | Status: AC
  Filled 2015-10-26: qty 1

## 2015-10-26 MED ORDER — LIDOCAINE HCL (PF) 1 % IJ SOLN
INTRAMUSCULAR | Status: DC | PRN
  Administered 2015-10-26: 12:00:00

## 2015-10-26 MED ORDER — HYDRALAZINE HCL 20 MG/ML IJ SOLN
10.0000 mg | Freq: Four times a day (QID) | INTRAMUSCULAR | Status: DC | PRN
  Administered 2015-10-26: 10 mg via INTRAVENOUS

## 2015-10-26 MED ORDER — ATORVASTATIN CALCIUM 40 MG PO TABS
40.0000 mg | ORAL_TABLET | Freq: Every day | ORAL | Status: DC
Start: 2015-10-26 — End: 2017-01-10

## 2015-10-26 MED ORDER — HYDROCHLOROTHIAZIDE 12.5 MG PO CAPS: 12.5000 mg | ORAL_CAPSULE | Freq: Every day | ORAL | Status: DC

## 2015-10-26 MED ORDER — ACETAMINOPHEN 325 MG PO TABS: 650.0000 mg | ORAL_TABLET | ORAL | Status: DC | PRN

## 2015-10-26 MED ORDER — SODIUM CHLORIDE 0.9 % IJ SOLN: 3.0000 mL | INTRAMUSCULAR | Status: DC | PRN

## 2015-10-26 MED ORDER — ONDANSETRON HCL 4 MG/2ML IJ SOLN
INTRAMUSCULAR | Status: AC
  Filled 2015-10-26: qty 2

## 2015-10-26 MED ORDER — HEPARIN (PORCINE) IN NACL 2-0.9 UNIT/ML-% IJ SOLN
INTRAMUSCULAR | Status: AC
  Filled 2015-10-26: qty 1000

## 2015-10-26 MED ORDER — VERAPAMIL HCL 2.5 MG/ML IV SOLN
INTRAVENOUS | Status: AC
  Filled 2015-10-26: qty 2

## 2015-10-26 SURGICAL SUPPLY — 14 items
CATH INFINITI 5 FR 3DRC (CATHETERS) ×1 IMPLANT
CATH INFINITI 5 FR JL3.5 (CATHETERS) ×2 IMPLANT
CATH INFINITI 5FR ANG PIGTAIL (CATHETERS) ×2 IMPLANT
CATH INFINITI 5FR JL4 (CATHETERS) ×1 IMPLANT
CATH INFINITI JR4 5F (CATHETERS) ×2 IMPLANT
DEVICE RAD COMP TR BAND LRG (VASCULAR PRODUCTS) ×2 IMPLANT
GLIDESHEATH SLEND SS 6F .021 (SHEATH) ×2 IMPLANT
KIT HEART LEFT (KITS) ×2 IMPLANT
PACK CARDIAC CATHETERIZATION (CUSTOM PROCEDURE TRAY) ×2 IMPLANT
SYR MEDRAD MARK V 150ML (SYRINGE) ×2 IMPLANT
TRANSDUCER W/STOPCOCK (MISCELLANEOUS) ×2 IMPLANT
TUBING CIL FLEX 10 FLL-RA (TUBING) ×2 IMPLANT
WIRE HI TORQ VERSACORE-J 145CM (WIRE) ×1 IMPLANT
WIRE SAFE-T 1.5MM-J .035X260CM (WIRE) ×2 IMPLANT

## 2015-10-26 NOTE — Progress Notes (Addendum)
       Patient Name: Mary Dalton Date of Encounter: 10/26/2015    SUBJECTIVE: No recurrent chest discomfort. Quiet night.  TELEMETRY:  No significant arrhythmias Filed Vitals:   10/26/15 0521 10/26/15 0747 10/26/15 0847 10/26/15 0852  BP: 175/70 171/69 140/70   Pulse: 54   61  Temp: 98.2 F (36.8 C)     TempSrc: Oral     Resp: 16     Height:      Weight: 111.177 kg (245 lb 1.6 oz)     SpO2: 100%       Intake/Output Summary (Last 24 hours) at 10/26/15 0907 Last data filed at 10/26/15 0329  Gross per 24 hour  Intake 1627.25 ml  Output    850 ml  Net 777.25 ml   LABS: Basic Metabolic Panel:  Recent Labs  10/25/15 0247 10/26/15 0540  NA 143 142  K 3.3* 3.6  CL 110 111  CO2 23 24  GLUCOSE 101* 105*  BUN 13 12  CREATININE 0.72 0.71  CALCIUM 8.8* 8.7*  MG  --  1.8   CBC:  Recent Labs  10/25/15 0830 10/26/15 0540  WBC 6.7 7.6  HGB 10.3* 10.5*  HCT 32.3* 32.3*  MCV 95.0 94.7  PLT 366 393   Cardiac Enzymes:  Recent Labs  10/24/15 2035 10/25/15 0247 10/25/15 0830  TROPONINI 0.11* 0.10* 0.10*   BNP: Invalid input(s): POCBNP Hemoglobin A1C:  Recent Labs  10/25/15 0247  HGBA1C 6.6*   Fasting Lipid Panel:  Recent Labs  10/25/15 0247  CHOL 141  HDL 33*  LDLCALC 92  TRIG 78  CHOLHDL 4.3   Echocardiogram: Normal systolic function, mild concentric LVH, EF 65%   Radiology/Studies:  No new data  Physical Exam: Blood pressure 140/70, pulse 61, temperature 98.2 F (36.8 C), temperature source Oral, resp. rate 16, height 5\' 6"  (1.676 m), weight 111.177 kg (245 lb 1.6 oz), SpO2 100 %. Weight change: 18.28 kg (40 lb 4.8 oz)  Wt Readings from Last 3 Encounters:  10/26/15 111.177 kg (245 lb 1.6 oz)    The chest is clear S4 gallop is heard on cardiac auscultation Extremities reveal no edema  ASSESSMENT:  1. Elevated troponin concerning for ACS. Good however represent demand related to severe blood pressure elevation. Significant  risk factors raise question of CAD. 2. Blood pressure is better controlled 3. Lipids are excellent  Plan:  1. The patient was counseled to undergo heart catheterization which may include left heart evaluation, coronary angiography, and possible percutaneous coronary intervention with stent implantation. The procedural risks and benefits were discussed in detail. The risks discussed include death, stroke, myocardial infarction, life-threatening bleeding, limb ischemia, kidney injury, allergy, and possible emergency cardiac surgery. The risk of these significant complications occurred less than 1% of the time. After discussion, the patient has agreed to proceed. 2. Possibly home later today if coronary intervention is not required 3. Aggressive blood pressure control will be needed with early follow-up to ensure compliance. Antihypertensive therapy as OP should be lisinopril HCT and low-dose beta blocker. Valerie Roys W 10/26/2015, 9:07 AM

## 2015-10-26 NOTE — Progress Notes (Signed)
OT Cancellation Note  Patient Details Name: Mary Dalton MRN: [id number] DOB: [birth year]   Cancelled Treatment:    Reason Eval/Treat Not Completed: Pain limiting ability to participate. Pt politely declined and reported she had a headache.  Benito Mccreedy  OTR/L [id number]  10/26/2015, 9:34 AM

## 2015-10-26 NOTE — Discharge Instructions (Signed)
Follow with Primary MD  in 7 days   Get BP, CBC, CMP, 2 view Chest X ray checked  by Primary MD next visit.    Activity: As tolerated with Full fall precautions use walker/cane & assistance as needed   Disposition Home     Diet: Heart Healthy   For Heart failure patients - Check your Weight same time everyday, if you gain over 2 pounds, or you develop in leg swelling, experience more shortness of breath or chest pain, call your Primary MD immediately. Follow Cardiac Low Salt Diet and 1.5 lit/day fluid restriction.   On your next visit with your primary care physician please Get Medicines reviewed and adjusted.   Please request your Prim.MD to go over all Hospital Tests and Procedure/Radiological results at the follow up, please get all Hospital records sent to your Prim MD by signing hospital release before you go home.   If you experience worsening of your admission symptoms, develop shortness of breath, life threatening emergency, suicidal or homicidal thoughts you must seek medical attention immediately by calling 911 or calling your MD immediately  if symptoms less severe.  You Must read complete instructions/literature along with all the possible adverse reactions/side effects for all the Medicines you take and that have been prescribed to you. Take any new Medicines after you have completely understood and accpet all the possible adverse reactions/side effects.   Do not drive, operating heavy machinery, perform activities at heights, swimming or participation in water activities or provide baby sitting services if your were admitted for syncope or siezures until you have seen by Primary MD or a Neurologist and advised to do so again.  Do not drive when taking Pain medications.    Do not take more than prescribed Pain, Sleep and Anxiety Medications  Special Instructions: If you have smoked or chewed Tobacco  in the last 2 yrs please stop smoking, stop any regular Alcohol  and or  any Recreational drug use.  Wear Seat belts while driving.   Please note  You were cared for by a hospitalist during your hospital stay. If you have any questions about your discharge medications or the care you received while you were in the hospital after you are discharged, you can call the unit and asked to speak with the hospitalist on call if the hospitalist that took care of you is not available. Once you are discharged, your primary care physician will handle any further medical issues. Please note that NO REFILLS for any discharge medications will be authorized once you are discharged, as it is imperative that you return to your primary care physician (or establish a relationship with a primary care physician if you do not have one) for your aftercare needs so that they can reassess your need for medications and monitor your lab values.

## 2015-10-26 NOTE — H&P (View-Only) (Signed)
       Patient Name: Mary Dalton Date of Encounter: 10/26/2015    SUBJECTIVE: No recurrent chest discomfort. Quiet night.  TELEMETRY:  No significant arrhythmias Filed Vitals:   10/26/15 0521 10/26/15 0747 10/26/15 0847 10/26/15 0852  BP: 175/70 171/69 140/70   Pulse: 54   61  Temp: 98.2 F (36.8 C)     TempSrc: Oral     Resp: 16     Height:      Weight: 111.177 kg (245 lb 1.6 oz)     SpO2: 100%       Intake/Output Summary (Last 24 hours) at 10/26/15 0907 Last data filed at 10/26/15 0329  Gross per 24 hour  Intake 1627.25 ml  Output    850 ml  Net 777.25 ml   LABS: Basic Metabolic Panel:  Recent Labs  10/25/15 0247 10/26/15 0540  NA 143 142  K 3.3* 3.6  CL 110 111  CO2 23 24  GLUCOSE 101* 105*  BUN 13 12  CREATININE 0.72 0.71  CALCIUM 8.8* 8.7*  MG  --  1.8   CBC:  Recent Labs  10/25/15 0830 10/26/15 0540  WBC 6.7 7.6  HGB 10.3* 10.5*  HCT 32.3* 32.3*  MCV 95.0 94.7  PLT 366 393   Cardiac Enzymes:  Recent Labs  10/24/15 2035 10/25/15 0247 10/25/15 0830  TROPONINI 0.11* 0.10* 0.10*   BNP: Invalid input(s): POCBNP Hemoglobin A1C:  Recent Labs  10/25/15 0247  HGBA1C 6.6*   Fasting Lipid Panel:  Recent Labs  10/25/15 0247  CHOL 141  HDL 33*  LDLCALC 92  TRIG 78  CHOLHDL 4.3   Echocardiogram: Normal systolic function, mild concentric LVH, EF 65%   Radiology/Studies:  No new data  Physical Exam: Blood pressure 140/70, pulse 61, temperature 98.2 F (36.8 C), temperature source Oral, resp. rate 16, height 5\' 6"  (1.676 m), weight 111.177 kg (245 lb 1.6 oz), SpO2 100 %. Weight change: 18.28 kg (40 lb 4.8 oz)  Wt Readings from Last 3 Encounters:  10/26/15 111.177 kg (245 lb 1.6 oz)    The chest is clear S4 gallop is heard on cardiac auscultation Extremities reveal no edema  ASSESSMENT:  1. Elevated troponin concerning for ACS. Good however represent demand related to severe blood pressure elevation. Significant  risk factors raise question of CAD. 2. Blood pressure is better controlled 3. Lipids are excellent  Plan:  1. The patient was counseled to undergo heart catheterization which may include left heart evaluation, coronary angiography, and possible percutaneous coronary intervention with stent implantation. The procedural risks and benefits were discussed in detail. The risks discussed include death, stroke, myocardial infarction, life-threatening bleeding, limb ischemia, kidney injury, allergy, and possible emergency cardiac surgery. The risk of these significant complications occurred less than 1% of the time. After discussion, the patient has agreed to proceed. 2. Possibly home later today if coronary intervention is not required 3. Aggressive blood pressure control will be needed with early follow-up to ensure compliance. Antihypertensive therapy as OP should be lisinopril HCT and low-dose beta blocker. Valerie Roys W 10/26/2015, 9:07 AM

## 2015-10-26 NOTE — Interval H&P Note (Signed)
Cath Lab Visit (complete for each Cath Lab visit)  Clinical Evaluation Leading to the Procedure:   ACS: Yes.    Non-ACS:    Anginal Classification: CCS IV  Anti-ischemic medical therapy: Minimal Therapy (1 class of medications)  Non-Invasive Test Results: No non-invasive testing performed  Prior CABG: No previous CABG   Low level positive troponin   History and Physical Interval Note:  10/26/2015 11:22 AM  Mary Dalton  has presented today for surgery, with the diagnosis of cp  The various methods of treatment have been discussed with the patient and family. After consideration of risks, benefits and other options for treatment, the patient has consented to  Procedure(s): Left Heart Cath and Coronary Angiography (N/A) as a surgical intervention .  The patient's history has been reviewed, patient examined, no change in status, stable for surgery.  I have reviewed the patient's chart and labs.  Questions were answered to the patient's satisfaction.     Shedric Fredericks S.

## 2015-10-26 NOTE — Discharge Summary (Signed)
Mary Dalton, is a 59 y.o. female  DOB 1956-05-29  MRN [id number].  Admission date:  10/24/2015  Admitting Physician  Ivor Costa, MD  Discharge Date:  10/26/2015   Primary MD  No PCP Per Patient  Recommendations for primary care physician for things to follow:   monitor blood pressure along with other risk factors for CAD.   Admission Diagnosis  Gastroesophageal reflux disease without esophagitis [K21.9] NSTEMI (non-ST elevated myocardial infarction) ([facility]) [I21.4] Essential hypertension [I10]   Discharge Diagnosis  Gastroesophageal reflux disease without esophagitis [K21.9] NSTEMI (non-ST elevated myocardial infarction) ([place]) [I21.4] Essential hypertension [I10]    Principal Problem:   Chest pain Active Problems:   Stroke [place])   Hypertensive urgency   NSTEMI (non-ST elevated myocardial infarction) ([facility])   Diarrhea   Elevated troponin   GERD (gastroesophageal reflux disease)   Hypothyroidism   Essential hypertension      Past Medical History  Diagnosis Date  . Stroke ([place])   . Hypertension   . GERD (gastroesophageal reflux disease)   . Hypothyroidism     History reviewed. No pertinent past surgical history.     HPI  from the history and physical done on the day of admission:   Mary Dalton is a 59 y.o. female with PMH of hypertension not on medications, GERD, stroke, hypothyroidism, left knee arthritis,who presents with chest pain and diarrhea.  Patient was in her usual state of health until today around 1:00PM when she had an episode of chest pain. Her chest pain is located in the substernal area, moderate, 6 out of 10 in severity, pressure-like, non-radiating. It is was not aggravated or alleviated by any known factors. Her chest pain lasted for about a few minutes, then resolved  spontaneously. It was associated with diaphoresis, weakness and shortness of breath. Her blood pressure was checked, which was elevated at 200/100. She states that she has not been taking blood pressure medications since her bp has been normal recently. She visited her PCP one and half months ago, and her blood pressure was checked and was normal in that time. Patient does not have fever, chills, cough. No tenderness over calf areas. When I saw pt in ED, she is chest pain-free.  She also reports having diarrhea started this morning. She has had 3 bowel movements with watery stool. She has some mild nausea, but no vomiting or abdominal pain. No recent antibiotics use.patient does not have symptoms of UTI, unilateral weakness, hematuria, hematemesis, hematochezia  In ED, patient was found to have troponin 0.10-->0.03-->0.11, lactate 2.12-->1.71, urinalysis showed trace amount of leukocytes, temperature normal, electrolytes okay, renal function okay, negative chest x-ray. Patient submitted to inpatient for further eval and treatment. Cardiology was consulted.      Hospital Course:     1. NSTEMI - chest pain free now, cardiology following, currently on heparin drip, aspirin, statin and low-dose beta blocker, underwent echogram which was unremarkable with preserved EF of 60% and chronic grade 1 diastolic dysfunction. She was seen by cardiology and  underwent left heart cath which showed nonocclusive but diffuse CAD. Medical management was recommended which she has been placed on. She will follow with cardiology and PCP post discharge. Currently completely symptom free.   2. Hypertension. Stable on ACE inhibitor, low-dose HCTZ and beta blocker.   3. Dyslipidemia. Placed on statin.   4. Hypothyroidism. On Synthroid continue TSH stable.   5. History of stroke. Continue aspirin and statin for secondary prevention no acute issues.     Discharge Condition: Stable  Follow UP  Follow-up  Information    Follow up with [facility] R, NP. Go on 11/09/2015.   Specialties:  Cardiology, Radiology   Why:  @9 :00am    Contact information:   [place] [address] [phone]       Follow up with PCP. Schedule an appointment as soon as possible for a visit in 1 week.       Consults obtained - Cards  Diet and Activity recommendation: See Discharge Instructions below  Discharge Instructions       Discharge Instructions    Diet - low sodium heart healthy    Complete by:  As directed      Discharge instructions    Complete by:  As directed   Follow with Primary MD  in 7 days   Get BP, CBC, CMP, 2 view Chest X ray checked  by Primary MD next visit.    Activity: As tolerated with Full fall precautions use walker/cane & assistance as needed   Disposition Home     Diet: Heart Healthy   For Heart failure patients - Check your Weight same time everyday, if you gain over 2 pounds, or you develop in leg swelling, experience more shortness of breath or chest pain, call your Primary MD immediately. Follow Cardiac Low Salt Diet and 1.5 lit/day fluid restriction.   On your next visit with your primary care physician please Get Medicines reviewed and adjusted.   Please request your Prim.MD to go over all Hospital Tests and Procedure/Radiological results at the follow up, please get all Hospital records sent to your Prim MD by signing hospital release before you go home.   If you experience worsening of your admission symptoms, develop shortness of breath, life threatening emergency, suicidal or homicidal thoughts you must seek medical attention immediately by calling 911 or calling your MD immediately  if symptoms less severe.  You Must read complete instructions/literature along with all the possible adverse reactions/side effects for all the Medicines you take and that have been prescribed to you. Take any new Medicines after you have completely  understood and accpet all the possible adverse reactions/side effects.   Do not drive, operating heavy machinery, perform activities at heights, swimming or participation in water activities or provide baby sitting services if your were admitted for syncope or siezures until you have seen by Primary MD or a Neurologist and advised to do so again.  Do not drive when taking Pain medications.    Do not take more than prescribed Pain, Sleep and Anxiety Medications  Special Instructions: If you have smoked or chewed Tobacco  in the last 2 yrs please stop smoking, stop any regular Alcohol  and or any Recreational drug use.  Wear Seat belts while driving.   Please note  You were cared for by a hospitalist during your hospital stay. If you have any questions about your discharge medications or the care you received while you were in the hospital  after you are discharged, you can call the unit and asked to speak with the hospitalist on call if the hospitalist that took care of you is not available. Once you are discharged, your primary care physician will handle any further medical issues. Please note that NO REFILLS for any discharge medications will be authorized once you are discharged, as it is imperative that you return to your primary care physician (or establish a relationship with a primary care physician if you do not have one) for your aftercare needs so that they can reassess your need for medications and monitor your lab values.     Increase activity slowly    Complete by:  As directed              Discharge Medications       Medication List    STOP taking these medications        ibuprofen 200 MG tablet  Commonly known as:  ADVIL,MOTRIN      TAKE these medications        aspirin 81 MG EC tablet  Take 1 tablet (81 mg total) by mouth daily.  Start taking on:  10/27/2015     atorvastatin 40 MG tablet  Commonly known as:  LIPITOR  Take 1 tablet (40 mg total) by mouth  daily at 6 PM.     hydrochlorothiazide 12.5 MG capsule  Commonly known as:  MICROZIDE  Take 1 capsule (12.5 mg total) by mouth daily.     levothyroxine 112 MCG tablet  Commonly known as:  SYNTHROID, LEVOTHROID  Take 112 mcg by mouth daily before breakfast.     lisinopril 10 MG tablet  Commonly known as:  PRINIVIL,ZESTRIL  Take 1 tablet (10 mg total) by mouth daily.     metoprolol tartrate 25 MG tablet  Commonly known as:  LOPRESSOR  Take 1 tablet (25 mg total) by mouth 2 (two) times daily.     omeprazole 40 MG capsule  Commonly known as:  PRILOSEC  Take 1 capsule (40 mg total) by mouth daily.        Major procedures and Radiology Reports - PLEASE review detailed and final reports for all details, in brief -    Cath   Mild to moderate diffuse coronary artery disease without focal significant stenosis.  Due to the severe tortuosity in the right shoulder, I would not recommend using the right radial artery for cardiac catheterization if this was needed in the future.  LVEDP 16 mmHg.  Plan medical therapy with aggressive blood pressure control. She should continue with aggressive preventative therapy for coronary artery disease given the diffuse coronary plaquing that she has.    TTE   - Left ventricle: The cavity size was normal. Wall thickness was increased in a pattern of mild LVH. Systolic function was normal. The estimated ejection fraction was in the range of 60% to 65%. Wall motion was normal; there were no regional wall motion abnormalities. Doppler parameters are consistent with abnormal left ventricular relaxation (grade 1 diastolic dysfunction). - Aortic valve: There was no stenosis. - Mitral valve: Mildly calcified annulus. There was no significant regurgitation. - Right ventricle: The cavity size was normal. Systolic function was normal. - Pulmonary arteries: No complete TR doppler jet so unable to estimate PA systolic pressure. -  Inferior vena cava: The vessel was normal in size. The respirophasic diameter changes were in the normal range (>= 50%), consistent with normal central venous pressure.  Impressions:  - Normal LV  size with mild LV hypertrophy. EF 60-65%. Normal RV size and systolic function. No significant valvular abnormalities.    Dg Chest 2 View  10/24/2015  CLINICAL DATA:  Chest pain EXAM: CHEST  2 VIEW COMPARISON:  05/29/2014 chest radiograph. FINDINGS: Stable cardiomediastinal silhouette with normal heart size. No pneumothorax. No pleural effusion. Clear lungs, with no focal lung consolidation and no pulmonary edema. IMPRESSION: No active cardiopulmonary disease. Electronically Signed   By: Ilona Sorrel M.D.   On: 10/24/2015 16:15    Micro Results      Recent Results (from the past 240 hour(s))  C difficile quick scan w PCR reflex     Status: None   Collection Time: 10/25/15  8:15 AM  Result Value Ref Range Status   C Diff antigen NEGATIVE NEGATIVE Final   C Diff toxin NEGATIVE NEGATIVE Final   C Diff interpretation Negative for toxigenic C. difficile  Final       Today   Subjective    Mary Dalton today has no headache,no chest abdominal pain,no new weakness tingling or numbness, feels much better wants to go home today.     Objective   Blood pressure 150/82, pulse 0, temperature 98.2 F (36.8 C), temperature source Oral, resp. rate 0, height 5\' 6"  (1.676 m), weight 111.177 kg (245 lb 1.6 oz), SpO2 0 %.   Intake/Output Summary (Last 24 hours) at 10/26/15 1234 Last data filed at 10/26/15 0329  Gross per 24 hour  Intake 942.25 ml  Output    500 ml  Net 442.25 ml    Exam Awake Alert, Oriented x 3, No new F.N deficits, Normal affect [place].AT,PERRAL Supple Neck,No JVD, No cervical lymphadenopathy appriciated.  Symmetrical Chest wall movement, Good air movement bilaterally, CTAB RRR,No Gallops,Rubs or new Murmurs, No Parasternal Heave +ve B.Sounds, Abd Soft, Non  tender, No organomegaly appriciated, No rebound -guarding or rigidity. No Cyanosis, Clubbing or edema, No new Rash or bruise   Data Review   CBC w Diff: Lab Results  Component Value Date   WBC 7.6 10/26/2015   HGB 10.5* 10/26/2015   HCT 32.3* 10/26/2015   PLT 393 10/26/2015   LYMPHOPCT 31 08/16/2007   MONOPCT 5 08/16/2007   EOSPCT 2 08/16/2007   BASOPCT 1 08/16/2007    CMP: Lab Results  Component Value Date   NA 142 10/26/2015   K 3.6 10/26/2015   CL 111 10/26/2015   CO2 24 10/26/2015   BUN 12 10/26/2015   CREATININE 0.71 10/26/2015   PROT 7.5 10/24/2015   ALBUMIN 3.5 10/24/2015   BILITOT 0.7 10/24/2015   ALKPHOS 152* 10/24/2015   AST 40 10/24/2015   ALT 35 10/24/2015  .   Total Time in preparing paper work, data evaluation and todays exam - 35 minutes  Thurnell Lose M.D on 10/26/2015 at 12:34 PM  [facility]   Office  [phone]

## 2015-10-27 MED FILL — Heparin Sodium (Porcine) Inj 1000 Unit/ML: INTRAMUSCULAR | Qty: 10 | Status: AC

## 2015-11-09 ENCOUNTER — Ambulatory Visit (INDEPENDENT_AMBULATORY_CARE_PROVIDER_SITE_OTHER): Payer: 59 | Admitting: Cardiology

## 2015-11-09 ENCOUNTER — Encounter: Payer: Self-pay | Admitting: Cardiology

## 2015-11-09 VITALS — BP 162/90 | HR 63 | Ht 66.0 in | Wt 239.8 lb

## 2015-11-09 DIAGNOSIS — I10 Essential (primary) hypertension: Secondary | ICD-10-CM

## 2015-11-09 DIAGNOSIS — E8881 Metabolic syndrome: Secondary | ICD-10-CM | POA: Diagnosis not present

## 2015-11-09 DIAGNOSIS — E785 Hyperlipidemia, unspecified: Secondary | ICD-10-CM

## 2015-11-09 DIAGNOSIS — I2510 Atherosclerotic heart disease of native coronary artery without angina pectoris: Secondary | ICD-10-CM | POA: Diagnosis not present

## 2015-11-09 MED ORDER — LISINOPRIL 20 MG PO TABS: 20.0000 mg | ORAL_TABLET | Freq: Every day | ORAL | Status: DC

## 2015-11-09 NOTE — Progress Notes (Signed)
Cardiology Office Note   Date:  11/09/2015   ID:  ELLAROSE CHURN, DOB [birth year], MRN [id number]  PCP:  Velna Hatchet, MD  Cardiologist:  Dr. Tamala Julian    Chief Complaint  Patient presents with  . Coronary Artery Disease  . Hypertension      History of Present Illness: Mary Dalton is a 59 y.o. female who presents for post hospital eval for chest pain and HTN 181/90.  Her troponins peaked to 0.10 EKG with LVH.  With non obstructive disease on cath though diffuse non obstructive diseas Dr. Tamala Julian felt elevated troponin related to demand ischemia from HTN.   With risk factors and diffuse disease aggressive CAD management recommended.  Lipids were good, now on statin, follow up in 6 weeks with hepatic and lipid.    Today no chest pain, no SOB.  She is eating healthy and beginning to exercise, we went over her cardiac cath and importance of diet and exercise. Her HgbA1c was also elevated at 6.6 - she will see PCP back for further monitoring and we discussed diet.    Past Medical History  Diagnosis Date  . Stroke (Mary Dalton)   . Hypertension   . GERD (gastroesophageal reflux disease)   . Hypothyroidism     Past Surgical History  Procedure Laterality Date  . Cardiac catheterization N/A 10/26/2015    Procedure: Left Heart Cath and Coronary Angiography;  Surgeon: Jettie Booze, MD;  Location: [facility];  Service: Cardiovascular;  Laterality: N/A;  . Cholecystectomy       Current Outpatient Prescriptions  Medication Sig Dispense Refill  . aspirin EC 81 MG EC tablet Take 1 tablet (81 mg total) by mouth daily. 30 tablet 0  . atorvastatin (LIPITOR) 40 MG tablet Take 1 tablet (40 mg total) by mouth daily at 6 PM. 30 tablet 0  . hydrochlorothiazide (MICROZIDE) 12.5 MG capsule Take 1 capsule (12.5 mg total) by mouth daily. 30 capsule 0  . levothyroxine (SYNTHROID, LEVOTHROID) 112 MCG tablet Take 112 mcg by mouth daily before breakfast.    . metoprolol tartrate  (LOPRESSOR) 25 MG tablet Take 1 tablet (25 mg total) by mouth 2 (two) times daily. 60 tablet 0  . omeprazole (PRILOSEC) 40 MG capsule Take 1 capsule (40 mg total) by mouth daily. 30 capsule 0  . lisinopril (PRINIVIL,ZESTRIL) 20 MG tablet Take 1 tablet (20 mg total) by mouth daily. 90 tablet 3   No current facility-administered medications for this visit.    Allergies:   Review of patient's allergies indicates no known allergies.    Social History:  The patient  reports that she has never smoked. She has never used smokeless tobacco. She reports that she does not drink alcohol or use illicit drugs.   Family History:  The patient's family history includes Healthy in her brother, brother, brother, father, and mother.    ROS:  General:no colds or fevers, + weight decrease by diet change Skin:no rashes or ulcers HEENT:no blurred vision, no congestion CV:see HPI PUL:see HPI GI:no diarrhea constipation or melena, no indigestion GU:no hematuria, no dysuria [id number] hx Lt knee pain, no claudication Neuro:no syncope, no lightheadedness Endo:no diabetes- but elevated hgbA1c, + thyroid disease  Wt Readings from Last 3 Encounters:  11/09/15 239 lb 12.8 oz (108.773 kg)  10/26/15 245 lb 1.6 oz (111.177 kg)     PHYSICAL EXAM: VS:  BP 162/90 mmHg  Pulse 63  Ht 5\' 6"  (1.676 m)  Wt 239 lb 12.8 oz (  108.773 kg)  BMI 38.72 kg/m2  SpO2 95% , BMI Body mass index is 38.72 kg/(m^2). General:Pleasant affect, NAD Skin:Warm and dry, brisk capillary refill HEENT:normocephalic, sclera clear, mucus membranes moist Neck:supple, no JVD, no bruits  Heart:S1S2 RRR without murmur, gallup, rub or click Lungs:clear without rales, rhonchi, or wheezes [id number], non tender, + BS, do not palpate liver spleen or masses Ext:no lower ext edema, 2+ pedal pulses, 2+ radial pulses Neuro:alert and oriented, MAE, follows commands, + facial symmetry    EKG:  EKG is NOT ordered today.    Recent Labs: 10/24/2015:  ALT 35 10/25/2015: TSH 1.223 10/26/2015: BUN 12; Creatinine, Ser 0.71; Hemoglobin 10.5*; Magnesium 1.8; Platelets 393; Potassium 3.6; Sodium 142    Lipid Panel    Component Value Date/Time   CHOL 141 10/25/2015 0247   TRIG 78 10/25/2015 0247   HDL 33* 10/25/2015 0247   CHOLHDL 4.3 10/25/2015 0247   VLDL 16 10/25/2015 0247   LDLCALC 92 10/25/2015 0247       Other studies Reviewed: Additional studies/ records that were reviewed today include: .   Cardiac Cath:  Mild to moderate diffuse coronary artery disease without focal significant stenosis.  Due to the severe tortuosity in the right shoulder, I would not recommend using the right radial artery for cardiac catheterization if this was needed in the future.  LVEDP 16 mmHg.  Plan medical therapy with aggressive blood pressure control. She should continue with aggressive preventative therapy for coronary artery disease given the diffuse coronary plaquing that she has.   ECHO: Study Conclusions  - Left ventricle: The cavity size was normal. Wall thickness was increased in a pattern of mild LVH. Systolic function was normal. The estimated ejection fraction was in the range of 60% to 65%. Wall motion was normal; there were no regional wall motion abnormalities. Doppler parameters are consistent with abnormal left ventricular relaxation (grade 1 diastolic dysfunction). - Aortic valve: There was no stenosis. - Mitral valve: Mildly calcified annulus. There was no significant regurgitation. - Right ventricle: The cavity size was normal. Systolic function was normal. - Pulmonary arteries: No complete TR doppler jet so unable to estimate PA systolic pressure. - Inferior vena cava: The vessel was normal in size. The respirophasic diameter changes were in the normal range (>= 50%), consistent with normal central venous pressure.  Impressions:  - Normal LV size with mild LV hypertrophy. EF 60-65%. Normal  RV size and systolic function. No significant valvular abnormalities.  ASSESSMENT AND PLAN:  1.  HTN uncontrolled in hospital now elevated at 162/90 with goal < 130/85, will increase lisinopril to 20 mg daily. Follow up with Dr. Tamala Julian in 6 months  2.  CAD non obstructive, but would plan aggressive therapy for diffuse disease.   3. Dyslipidemia continue statin may be easier to follow with PCP but will recheck lipids in 4 weeks  4. Elevated hgbA1c  Discussed cutting out sweets, decrease breads and if use do whole wheat.  Follow up with PCP soon  5.  Hx CVA continue ASA.   Current medicines are reviewed with the patient today.  The patient Has no concerns regarding medicines.  The following changes have been made:  See above Labs/ tests ordered today include:see above  Disposition:   FU:  see above  Signed, Isaiah Serge, NP  11/09/2015 9:40 AM    [facility] [place], [place], [place], [place] Phone: [phone]; Fax: [phone]  [phone]  [phone]   

## 2015-11-09 NOTE — Patient Instructions (Signed)
Medication Instructions:   INCREASE YOUR LISINOPRIL TO 20 MG ONCE DAILY   Labwork:  IN 3 TO 4 WEEKS TO CHECK A CMET AND FASTING LIPIDS---PLEASE COME FASTING TO THIS LAB APPOINTMENT   Follow-Up:  Your physician wants you to follow-up in: [place] will receive a reminder letter in the mail two months in advance. If you don't receive a letter, please call our office to schedule the follow-up appointment.    Cecilie Kicks NP ADVISES THAT YOU FOLLOW-UP WITH YOUR PCP FOR ELEVATED BLOOD SUGAR-     If you need a refill on your cardiac medications before your next appointment, please call your pharmacy.

## 2015-12-10 ENCOUNTER — Other Ambulatory Visit (INDEPENDENT_AMBULATORY_CARE_PROVIDER_SITE_OTHER): Payer: Self-pay

## 2015-12-10 DIAGNOSIS — I10 Essential (primary) hypertension: Secondary | ICD-10-CM

## 2015-12-10 DIAGNOSIS — E785 Hyperlipidemia, unspecified: Secondary | ICD-10-CM

## 2015-12-10 NOTE — Addendum Note (Signed)
Addended by: Eulis Foster on: 12/10/2015 09:23 AM   Modules accepted: Orders

## 2016-12-26 DIAGNOSIS — E119 Type 2 diabetes mellitus without complications: Secondary | ICD-10-CM | POA: Diagnosis not present

## 2016-12-26 DIAGNOSIS — M21611 Bunion of right foot: Secondary | ICD-10-CM | POA: Diagnosis not present

## 2016-12-26 DIAGNOSIS — M79673 Pain in unspecified foot: Secondary | ICD-10-CM | POA: Diagnosis not present

## 2016-12-26 DIAGNOSIS — E784 Other hyperlipidemia: Secondary | ICD-10-CM | POA: Diagnosis not present

## 2017-01-08 ENCOUNTER — Ambulatory Visit: Payer: Self-pay | Admitting: Podiatry

## 2017-01-10 ENCOUNTER — Encounter (INDEPENDENT_AMBULATORY_CARE_PROVIDER_SITE_OTHER): Payer: Self-pay | Admitting: Orthopedic Surgery

## 2017-01-10 ENCOUNTER — Ambulatory Visit (INDEPENDENT_AMBULATORY_CARE_PROVIDER_SITE_OTHER): Payer: Self-pay

## 2017-01-10 ENCOUNTER — Ambulatory Visit (INDEPENDENT_AMBULATORY_CARE_PROVIDER_SITE_OTHER): Payer: BLUE CROSS/BLUE SHIELD | Admitting: Orthopedic Surgery

## 2017-01-10 ENCOUNTER — Ambulatory Visit (INDEPENDENT_AMBULATORY_CARE_PROVIDER_SITE_OTHER): Payer: BLUE CROSS/BLUE SHIELD

## 2017-01-10 ENCOUNTER — Ambulatory Visit (INDEPENDENT_AMBULATORY_CARE_PROVIDER_SITE_OTHER): Payer: Self-pay | Admitting: Orthopedic Surgery

## 2017-01-10 ENCOUNTER — Encounter (INDEPENDENT_AMBULATORY_CARE_PROVIDER_SITE_OTHER): Payer: Self-pay

## 2017-01-10 DIAGNOSIS — M79671 Pain in right foot: Secondary | ICD-10-CM | POA: Insufficient documentation

## 2017-01-10 DIAGNOSIS — M1712 Unilateral primary osteoarthritis, left knee: Secondary | ICD-10-CM

## 2017-01-10 DIAGNOSIS — M1711 Unilateral primary osteoarthritis, right knee: Secondary | ICD-10-CM

## 2017-01-10 DIAGNOSIS — M25561 Pain in right knee: Secondary | ICD-10-CM

## 2017-01-10 DIAGNOSIS — G8929 Other chronic pain: Secondary | ICD-10-CM | POA: Diagnosis not present

## 2017-01-10 HISTORY — DX: Unilateral primary osteoarthritis, right knee: M17.11

## 2017-01-10 NOTE — Progress Notes (Signed)
Office Visit Note   Patient: Mary Dalton           Date of Birth: 1956/06/29           MRN: [id number] Visit Date: 01/10/2017 Requested by: Velna Hatchet, MD [place], [place] [address] PCP: Velna Hatchet, MD  Subjective: Chief Complaint  Patient presents with  . Left Knee - Pain  . Right Knee - Pain  . Right Foot - Pain    HPI Mary Dalton is a 61 year old patient with left knee arthritis and right knee arthritis.  She's had Synvisc injections before as well as a significant amount of conservative treatment.  She is also concerned about her right foot with soreness and mass and hardness on the dorsal aspect of the foot.  She does that for several months.  She states that the right foot feels stiff.  The taking any medications for the problem.  She has a lot of difficulty actually getting up and walking.  She reports pain which is worse in the left knee than the right.  The pain is rated at 9-10 out of 10 at times.  She is having difficulty sleeping.  The taking any medications currently for the problem.All systems reviewed are negative as they relate to the chief complaint within the history of present illness.  Patient denies  fevers or chills.               Review of Systems   Assessment & Plan: Visit Diagnoses:  1. Chronic pain of right knee   2. Pain in right foot   3. Primary osteoarthritis of left knee     Plan: Impression is severe end-stage bilateral knee arthritis worse on the left-hand side.  Plan is left total knee replacement.  I would do cemented posterior cruciate sacrificing with a stem.  Risks and benefits discussed including but limited to infection or vessel damage knee stiffness as well as extensive rehabilitative process required.  Patient has had relatives have undergone this procedure and she has pretty good understanding of the nature of the recovery.  Longevity of the prosthesis also discussed.  All questions answered.  Follow-Up  Instructions: No Follow-up on file.   Orders:  Orders Placed This Encounter  Procedures  . XR Foot Complete Right  . XR KNEE 3 VIEW RIGHT  . XR Knee 1-2 Views Left   No orders of the defined types were placed in this encounter.     Procedures: No procedures performed   Clinical Data: No additional findings.  Objective: Vital Signs: There were no vitals taken for this visit.  Physical Exam   Constitutional: Patient appears well-developed HEENT:  Head: Normocephalic Eyes:EOM are normal Neck: Normal range of motion Cardiovascular: Normal rate Pulmonary/chest: Effort normal Neurologic: Patient is alert Skin: Skin is warm Psychiatric: Patient has normal mood and affect    Ortho Exam or the exam demonstrates intact extensor mechanism bilaterally with intact skin dorsally.  Pedal pulses palpable in both feet.  No groin pain with internal/external rotation of the hip was.  She has about a 10 flexion contracture bilaterally.  Collaterals are stable to varus valgus stress at 0 and 30 bilaterally.  No other masses lymph adenopathy or skin changes noted in the knee regions  Specialty Comments:  No specialty comments available.  Imaging: Xr Knee 1-2 Views Left  Result Date: 01/10/2017 Left knee AP lateral reviewed.  End-stage tricompartmental arthritis is present.  Slight varus alignment is present.  No other  soft tissue calcifications noted.  There is bony erosion in the medial compartment.  Xr Foot Complete Right  Result Date: 01/10/2017 Right foot 3 views reviewed AP lateral oblique.  Hallux valgus deformity is present.  Tarsometatarsal arthritis is present about the midfoot and the spurring present is likely accounting for the patient's perceived mass on the dorsal aspect of her foot.  No fractures present    PMFS History: Patient Active Problem List   Diagnosis Date Noted  . Chronic pain of right knee 01/10/2017  . Primary osteoarthritis of right knee 01/10/2017  .  Pain in right foot 01/10/2017  . Primary osteoarthritis of left knee 01/10/2017  . Hyperlipidemia 11/09/2015  . Essential hypertension   . Chest pain 10/24/2015  . NSTEMI (non-ST elevated myocardial infarction) ([place]) 10/24/2015  . Diarrhea 10/24/2015  . Elevated troponin 10/24/2015  . Hypothyroidism 10/24/2015  . Stroke ([place])   . Hypertensive urgency   . GERD (gastroesophageal reflux disease)   . Gastroesophageal reflux disease without esophagitis    Past Medical History:  Diagnosis Date  . CAD (coronary artery disease), native coronary artery 10/2015   diffuse disease, but non obstructive  . GERD (gastroesophageal reflux disease)   . Hypertension   . Hypothyroidism   . Metabolic syndrome   . Stroke [facility])     Family History  Problem Relation Age of Onset  . Healthy Mother   . Healthy Father   . Healthy Brother   . Healthy Brother   . Healthy Brother     Past Surgical History:  Procedure Laterality Date  . CARDIAC CATHETERIZATION N/A 10/26/2015   Procedure: Left Heart Cath and Coronary Angiography;  Surgeon: Jettie Booze, MD;  Location: [place] [facility];  Service: Cardiovascular;  Laterality: N/A;  . CHOLECYSTECTOMY     Social History   Occupational History  . Not on file.   Social History Main Topics  . Smoking status: Never Smoker  . Smokeless tobacco: Never Used  . Alcohol use No  . Drug use: No  . Sexual activity: Not on file

## 2017-01-19 ENCOUNTER — Encounter: Payer: Self-pay | Admitting: Cardiology

## 2017-01-24 ENCOUNTER — Ambulatory Visit (INDEPENDENT_AMBULATORY_CARE_PROVIDER_SITE_OTHER): Payer: Self-pay | Admitting: Orthopedic Surgery

## 2017-01-30 ENCOUNTER — Encounter: Payer: Self-pay | Admitting: *Deleted

## 2017-01-30 ENCOUNTER — Encounter: Payer: Self-pay | Admitting: Cardiology

## 2017-01-30 ENCOUNTER — Encounter (INDEPENDENT_AMBULATORY_CARE_PROVIDER_SITE_OTHER): Payer: Self-pay

## 2017-01-30 ENCOUNTER — Ambulatory Visit (INDEPENDENT_AMBULATORY_CARE_PROVIDER_SITE_OTHER): Payer: BLUE CROSS/BLUE SHIELD | Admitting: Cardiology

## 2017-01-30 ENCOUNTER — Ambulatory Visit: Payer: Self-pay | Admitting: Cardiology

## 2017-01-30 VITALS — BP 142/84 | HR 69 | Ht 66.0 in | Wt 234.2 lb

## 2017-01-30 DIAGNOSIS — I10 Essential (primary) hypertension: Secondary | ICD-10-CM

## 2017-01-30 DIAGNOSIS — Z01818 Encounter for other preprocedural examination: Secondary | ICD-10-CM | POA: Diagnosis not present

## 2017-01-30 DIAGNOSIS — I2510 Atherosclerotic heart disease of native coronary artery without angina pectoris: Secondary | ICD-10-CM

## 2017-01-30 NOTE — Patient Instructions (Addendum)
Medication Instructions:  Your physician recommends that you continue on your current medications as directed. Please refer to the Current Medication list given to you today.   Labwork: None ordered  Testing/Procedures: None ordered  Follow-Up: Your physician wants you to follow-up in: 1 YEAR WITH DR. SMITH  You will receive a reminder letter in the mail two months in advance. If you don't receive a letter, please call our office to schedule the follow-up appointment.   Any Other Special Instructions Will Be Listed Below (If Applicable).    If you need a refill on your cardiac medications before your next appointment, please call your pharmacy.   

## 2017-01-30 NOTE — Progress Notes (Signed)
01/30/2017 Mary Dalton   1956-05-14  [id number]  Primary Physician Velna Hatchet, MD Primary Cardiologist: Dr. Tamala Julian   Reason for Visit/CC: Cardiac Clearance for TKR Referring MD: Dr. Ardeth Perfect, PCP  HPI:  Ms. Mary Dalton presents to clinic today for cardiac clearance. She has bilateral knee OA, L>R, and is needing to undergo L TKR. Her PCP is in the process of referring her to an orthopedic surgeon and she was asked to get cardiac clearance.   We first started following her in the fall of 2016, when she presented to [facility] with complaint of chest pain in the setting of poorly controlled HTN. BP was 181/90. Her troponin peaked at 0.10. EKG with LVH. She underwent a LHC, per Dr. Tamala Julian, and was found to have diffuse non obstructive disease. Dr. Tamala Julian felt her elevated troponin was related to demand ischemia from HTN. EF was normal at 60-65%. Continued medical therapy and aggressive risk factor modification was recommended.   She denies any further issues since her last OV. She denies any recurrent CP. BP is better controlled with medications. Her BP is mildy elevated this am at 142/84 however she has not yet taken her morning meds. She is on HCTZ and metoprolol.   She is able to perform at least 4 METS of physical activity w/o limiations, other than knee pain. She denies any exertional angina or dsypnea. No syncope/ near syncope. Her EKG shows NSR w/o ischemia. Physical exam is benign.    Current Meds  Medication Sig  . aspirin EC 81 MG EC tablet Take 1 tablet (81 mg total) by mouth daily.  . colchicine 0.6 MG tablet Take 0.6 mg by mouth daily.  . hydrochlorothiazide (MICROZIDE) 12.5 MG capsule Take 1 capsule (12.5 mg total) by mouth daily.  Marland Kitchen levothyroxine (SYNTHROID, LEVOTHROID) 112 MCG tablet Take 112 mcg by mouth daily before breakfast.  . metoprolol tartrate (LOPRESSOR) 25 MG tablet Take 1 tablet (25 mg total) by mouth 2 (two) times daily.   No Known Allergies Past Medical History:   Diagnosis Date  . CAD (coronary artery disease), native coronary artery 10/2015   diffuse disease, but non obstructive  . GERD (gastroesophageal reflux disease)   . Hypertension   . Hypothyroidism   . Metabolic syndrome   . Stroke [facility])    Family History  Problem Relation Age of Onset  . Healthy Mother   . Healthy Father   . Healthy Brother   . Healthy Brother   . Healthy Brother    Past Surgical History:  Procedure Laterality Date  . CARDIAC CATHETERIZATION N/A 10/26/2015   Procedure: Left Heart Cath and Coronary Angiography;  Surgeon: Jettie Booze, MD;  Location: [place] [facility];  Service: Cardiovascular;  Laterality: N/A;  . CHOLECYSTECTOMY     Social History   Social History  . Marital status: Legally Separated    Spouse name: N/A  . Number of children: N/A  . Years of education: N/A   Occupational History  . Not on file.   Social History Main Topics  . Smoking status: Never Smoker  . Smokeless tobacco: Never Used  . Alcohol use No  . Drug use: No  . Sexual activity: Not on file   Other Topics Concern  . Not on file   Social History Narrative  . No narrative on file     Review of Systems: General: negative for chills, fever, night sweats or weight changes.  Cardiovascular: negative for chest pain, dyspnea on exertion, edema,  orthopnea, palpitations, paroxysmal nocturnal dyspnea or shortness of breath Dermatological: negative for rash Respiratory: negative for cough or wheezing Urologic: negative for hematuria Abdominal: negative for nausea, vomiting, diarrhea, bright red blood per rectum, melena, or hematemesis Neurologic: negative for visual changes, syncope, or dizziness All other systems reviewed and are otherwise negative except as noted above.   Physical Exam:  Blood pressure (!) 142/84, pulse 69, height 5\' 6"  (1.676 m), weight 234 lb 4 oz (106.3 kg), SpO2 98 %.  General appearance: alert, cooperative and no distress Neck: no carotid  bruit and no JVD Lungs: clear to auscultation bilaterally Heart: regular rate and rhythm, S1, S2 normal, no murmur, click, rub or gallop Extremities: extremities normal, atraumatic, no cyanosis or edema Pulses: 2+ and symmetric Skin: Skin color, texture, turgor normal. No rashes or lesions Neurologic: Grossly normal  EKG NSR. No ischemia. 68 bpm.   ASSESSMENT AND PLAN:   1. Nonobstructive CAD: noted on cath 10/26/15. She denies any anginal symptoms. EKG shows NSR w/o ischemia. Continue medical therapy.   2. HTN: controlled on current regimen.   3. Pre-operative Assessment/Cardiac Clearance: Pt with recent ischemic evaluation, including LHC in 10/2015 that showed nonobstructive CAD. EKG shows NSR w/o ischemia. Her physical exam is benign. She is able to complete 4 METS of physical activity w/o exertional CP or dyspnea. She is felt to be low risk for cardiac issues with this low risk surgery. There is no indication for additional pre-operative cardiac w/u. She has been cleared for surgery. Ok to hold ASA, temporarily, if needed. Recommend continuation of BB during the perioperative period.     PLAN  F/u in 1 year with Dr. Tamala Julian or sooner if needed.   Lyda Jester PA-C 01/30/2017 8:59 AM

## 2017-02-05 ENCOUNTER — Telehealth (INDEPENDENT_AMBULATORY_CARE_PROVIDER_SITE_OTHER): Payer: Self-pay | Admitting: *Deleted

## 2017-02-05 ENCOUNTER — Telehealth: Payer: Self-pay | Admitting: Cardiology

## 2017-02-05 NOTE — Telephone Encounter (Signed)
Pt called wanting to speak with wendy. Pt stated she would just wait for a call back and she would then tell wendy what she was calling for

## 2017-02-06 ENCOUNTER — Other Ambulatory Visit (INDEPENDENT_AMBULATORY_CARE_PROVIDER_SITE_OTHER): Payer: Self-pay | Admitting: Orthopedic Surgery

## 2017-02-06 ENCOUNTER — Telehealth (INDEPENDENT_AMBULATORY_CARE_PROVIDER_SITE_OTHER): Payer: Self-pay | Admitting: Orthopedic Surgery

## 2017-02-06 DIAGNOSIS — M1712 Unilateral primary osteoarthritis, left knee: Secondary | ICD-10-CM

## 2017-02-06 NOTE — Telephone Encounter (Signed)
Patient says we need to call her cardiologist office to get release for her to have surgery on her knee- she will call me right back and give [place] the number to call cardiologist at.

## 2017-02-06 NOTE — Telephone Encounter (Signed)
Pt called with the number for her cardiologist: [phone]  Taren, pt says before she can have knee surgery we have to call and get clearance from her cardiologist- please check on?  Thanks

## 2017-02-06 NOTE — Telephone Encounter (Signed)
Pt called with the number for her cardiologist: [phone]

## 2017-03-11 NOTE — Pre-Procedure Instructions (Signed)
Mary Dalton  03/11/2017      [id number] [place], [place] [address] Phone: [phone] Fax: [phone]  [place] ([place]), [place] - [address] [place] ([place])  [address] Phone: [phone] Fax: [phone]    Your procedure is scheduled on Tues, April 17 @ 10:00 AM  Report to [place] at 8:00 AM  Call this number if you have problems the morning of surgery:  [phone]   Remember:  Do not eat food or drink liquids after midnight.  Take these medicines the morning of surgery with A SIP OF WATER Synthroid(Levothyroxine) and Metoprolol(Lopressor)              Stop taking your Aspirin a week prior to surgery along with any Vitamins or Herbal Medications. No Goody's,BC's,Aleve,Advil,Motrin,Ibuprofen,or Fish Oil.    Do not wear jewelry, make-up or nail polish.  Do not wear lotions, powders, perfumes, or deoderant.  Do not shave 48 hours prior to surgery.    Do not bring valuables to the hospital.  [place] is not responsible for any belongings or valuables.  Contacts, dentures or bridgework may not be worn into surgery.  Leave your suitcase in the car.  After surgery it may be brought to your room.  For patients admitted to the hospital, discharge time will be determined by your treatment team.  Patients discharged the day of surgery will not be allowed to drive home.    [facility] Pre[facility]  Before surgery, you can play an important role.  Because skin is not sterile, your skin needs to be as free of germs as possible.  You can reduce the number of germs on you skin by washing with CHG (chlorahexidine gluconate) soap before surgery.  CHG is an antiseptic cleaner which kills germs and bonds with the skin to continue killing germs even after washing.  Please DO NOT use if you have an allergy to CHG or  antibacterial soaps.  If your skin becomes reddened/irritated stop using the CHG and inform your nurse when you arrive at Short Stay.  Do not shave (including legs and underarms) for at least 48 hours prior to the first CHG shower.  You may shave your face.  Please follow these instructions carefully:   1.  Shower with CHG Soap the night before surgery and the                                morning of Surgery.  2.  If you choose to wash your hair, wash your hair first as usual with your       normal shampoo.  3.  After you shampoo, rinse your hair and body thoroughly to remove the                      Shampoo.  4.  Use CHG as you would any other liquid soap.  You can apply chg directly       to the skin and wash gently with scrungie or a clean washcloth.  5.  Apply the CHG Soap to your body ONLY FROM THE NECK DOWN.        Do not use on open wounds or open sores.  Avoid contact with your eyes,       ears,  mouth and genitals (private parts).  Wash genitals (private parts)       with your normal soap.  6.  Wash thoroughly, paying special attention to the area where your surgery        will be performed.  7.  Thoroughly rinse your body with warm water from the neck down.  8.  DO NOT shower/wash with your normal soap after using and rinsing off       the CHG Soap.  9.  Pat yourself dry with a clean towel.            10.  Wear clean pajamas.            11.  Place clean sheets on your bed the night of your first shower and do not        sleep with pets.  Day of Surgery  Do not apply any lotions/deoderants the morning of surgery.  Please wear clean clothes to the hospital/surgery [place].      Please read over the following fact sheets that you were given. Pain Booklet, Coughing and Deep Breathing, MRSA Information and Surgical Site Infection Prevention

## 2017-03-12 ENCOUNTER — Encounter (HOSPITAL_COMMUNITY): Payer: Self-pay

## 2017-03-12 ENCOUNTER — Encounter (HOSPITAL_COMMUNITY)
Admission: RE | Admit: 2017-03-12 | Discharge: 2017-03-12 | Disposition: A | Discharge status: Home / Self Care | Payer: BLUE CROSS/BLUE SHIELD | Source: Ambulatory Visit | Attending: Orthopedic Surgery | Admitting: Orthopedic Surgery

## 2017-03-12 ENCOUNTER — Ambulatory Visit (HOSPITAL_COMMUNITY)
Admission: RE | Admit: 2017-03-12 | Discharge: 2017-03-12 | Disposition: A | Discharge status: Home / Self Care | Payer: BLUE CROSS/BLUE SHIELD | Source: Ambulatory Visit | Attending: Orthopedic Surgery | Admitting: Orthopedic Surgery

## 2017-03-12 DIAGNOSIS — Z01812 Encounter for preprocedural laboratory examination: Secondary | ICD-10-CM | POA: Insufficient documentation

## 2017-03-12 DIAGNOSIS — Z01810 Encounter for preprocedural cardiovascular examination: Secondary | ICD-10-CM | POA: Diagnosis not present

## 2017-03-12 DIAGNOSIS — I10 Essential (primary) hypertension: Secondary | ICD-10-CM | POA: Insufficient documentation

## 2017-03-12 DIAGNOSIS — K219 Gastro-esophageal reflux disease without esophagitis: Secondary | ICD-10-CM | POA: Insufficient documentation

## 2017-03-12 DIAGNOSIS — I2510 Atherosclerotic heart disease of native coronary artery without angina pectoris: Secondary | ICD-10-CM | POA: Insufficient documentation

## 2017-03-12 DIAGNOSIS — Z7982 Long term (current) use of aspirin: Secondary | ICD-10-CM | POA: Insufficient documentation

## 2017-03-12 DIAGNOSIS — Z419 Encounter for procedure for purposes other than remedying health state, unspecified: Secondary | ICD-10-CM

## 2017-03-12 DIAGNOSIS — Z79899 Other long term (current) drug therapy: Secondary | ICD-10-CM | POA: Insufficient documentation

## 2017-03-12 DIAGNOSIS — Z01818 Encounter for other preprocedural examination: Secondary | ICD-10-CM | POA: Insufficient documentation

## 2017-03-12 DIAGNOSIS — E039 Hypothyroidism, unspecified: Secondary | ICD-10-CM | POA: Insufficient documentation

## 2017-03-12 HISTORY — DX: Unspecified osteoarthritis, unspecified site: M19.90

## 2017-03-12 LAB — URINALYSIS, ROUTINE W REFLEX MICROSCOPIC
Bacteria, UA: NONE SEEN
Bilirubin Urine: NEGATIVE
GLUCOSE, UA: NEGATIVE mg/dL
Ketones, ur: NEGATIVE mg/dL
Leukocytes, UA: NEGATIVE
Nitrite: NEGATIVE
PH: 5 (ref 5.0–8.0)
Protein, ur: NEGATIVE mg/dL
SPECIFIC GRAVITY, URINE: 1.017 (ref 1.005–1.030)

## 2017-03-12 LAB — BASIC METABOLIC PANEL
Anion gap: 6 (ref 5–15)
BUN: 11 mg/dL (ref 4–21)
BUN: 11 mg/dL (ref 6–20)
CALCIUM: 9 mg/dL (ref 8.9–10.3)
CO2: 25 mmol/L (ref 22–32)
CREATININE: 0.69 mg/dL (ref 0.44–1.00)
Chloride: 108 mmol/L (ref 101–111)
Creatinine: 0.7 mg/dL (ref 0.5–1.1)
GFR calc non Af Amer: >60 mL/min (ref >60)
GLUCOSE: 115 mg/dL
GLUCOSE: 115 mg/dL — AB (ref 65–99)
Potassium: 3.8 mmol/L (ref 3.5–5.1)
Sodium: 139 mmol/L (ref 135–145)
Sodium: 139 mmol/L (ref 137–147)

## 2017-03-12 LAB — CBC
HCT: 39.1 % (ref 36.0–46.0)
Hemoglobin: 13 g/dL (ref 12.0–15.0)
MCH: 31 pg (ref 26.0–34.0)
MCHC: 33.2 g/dL (ref 30.0–36.0)
MCV: 93.3 fL (ref 78.0–100.0)
PLATELETS: 324 10*3/uL (ref 150–400)
RBC: 4.19 MIL/uL (ref 3.87–5.11)
RDW: 13.9 % (ref 11.5–15.5)
WBC: 6.5 10*3/uL (ref 4.0–10.5)

## 2017-03-12 LAB — SURGICAL PCR SCREEN
MRSA, PCR: NEGATIVE
STAPHYLOCOCCUS AUREUS: NEGATIVE

## 2017-03-12 LAB — CBC AND DIFFERENTIAL: WBC: 6.5 10*3/mL

## 2017-03-12 IMAGING — CR DG CHEST 2V
2 series · 2 of 2 positions shown · non-contrast
Comparison: [DATE]

CLINICAL DATA: Preop total knee replacement.  Hypertension.

EXAM:
CHEST  2 VIEW

[w chest pa]
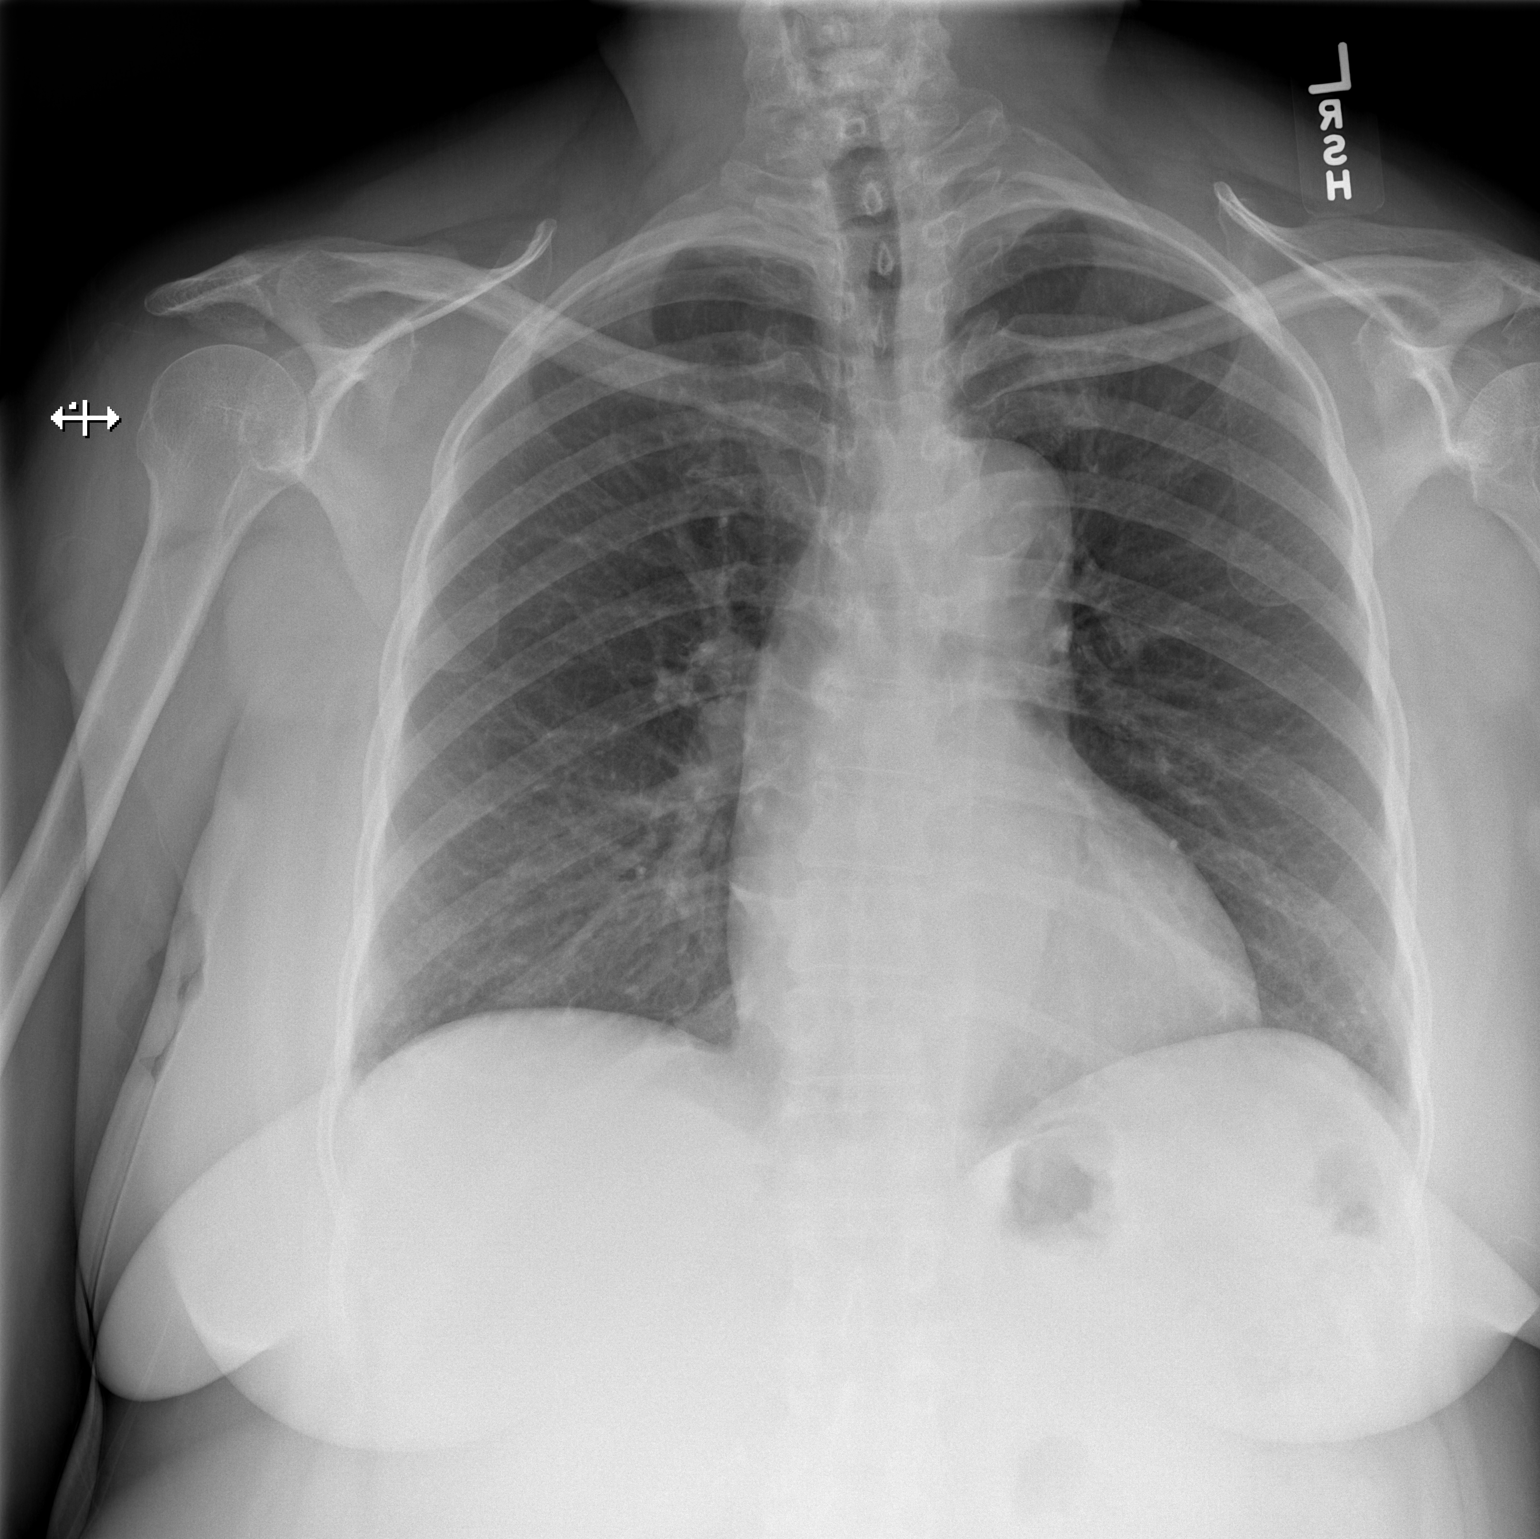

[w chest lat]
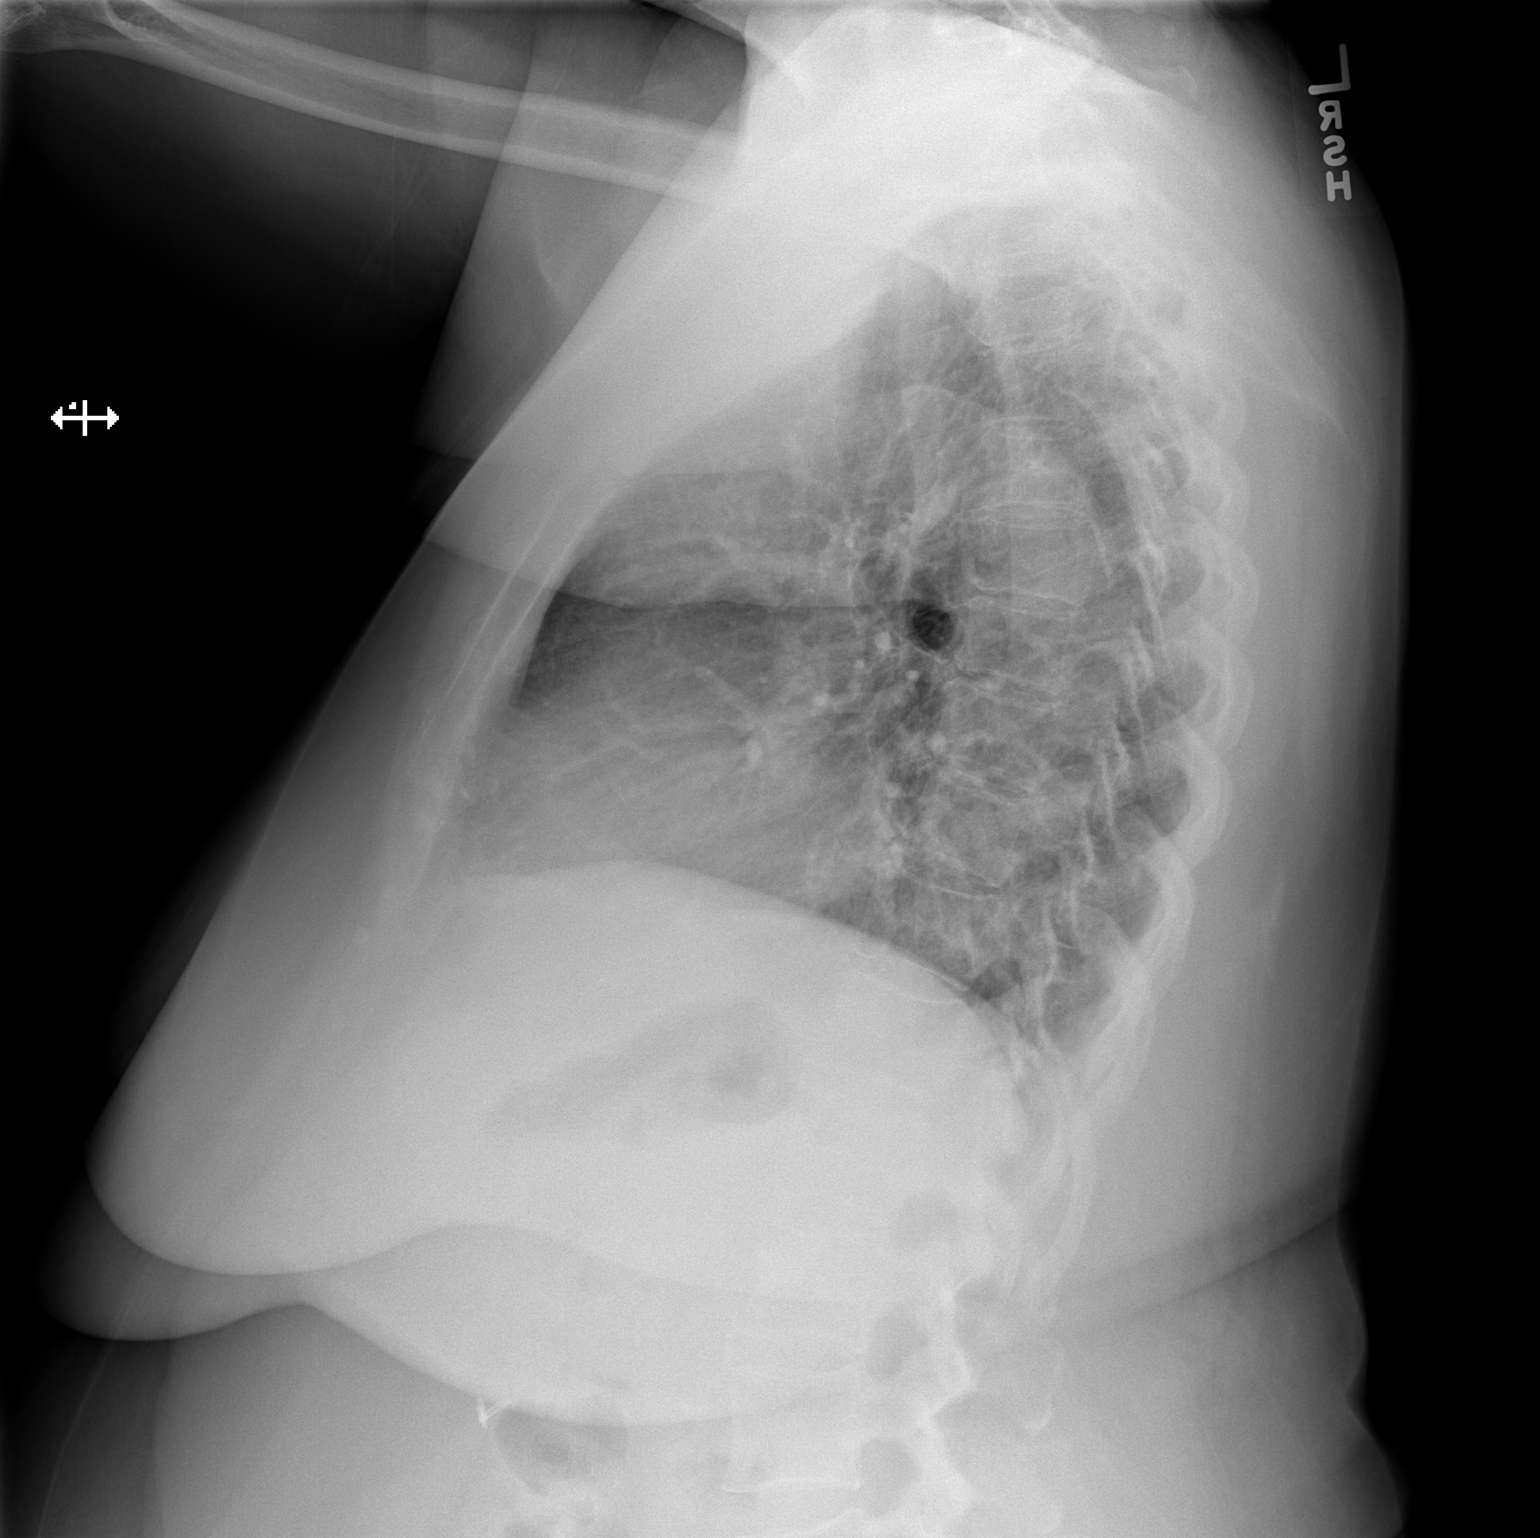

[2 of 2 positions shown; findings below may reference images not displayed]

FINDINGS: Heart and mediastinal contours are within normal limits. No focal
opacities or effusions. No acute bony abnormality.
IMPRESSION: No active cardiopulmonary disease.

## 2017-03-12 NOTE — Progress Notes (Signed)
PCP is Dr. Velna Hatchet Cardiologist is Dr. Irish Lack States she last saw him in 2016 Card cath noted from 2016 Echo noted from 2016 Denies ever having a stress test. Denies  Any chest pain.

## 2017-03-13 LAB — URINE CULTURE

## 2017-03-14 NOTE — Progress Notes (Signed)
Anesthesia Chart Review:  Pt is a 61 year old female scheduled for L total knee arthroplasty on 03/20/2017 with Meredith Pel, MD  - PCP is Velna Hatchet, MD - Cardiologist is Daneen Schick, MD; pt cleared for surgery at last office visit with Lyda Jester, PA 01/30/17  PMH includes:  CAD (mild to moderate diffuses nonobstructive 10/26/15 cath), stroke, HTN, metabolic syndrome, hypothyroidism, GERD.  Never smoker. BMI 38  Medications include: ASA 81 mg, HCTZ, levothyroxine, metoprolol  Preoperative labs reviewed.    CXR 03/12/17: No active cardiopulmonary disease.  EKG 01/30/17: NSR. Possible LAE. Septal infarct, age undetermined.  Cardiac cath 10/26/15:   Mild to moderate diffuse coronary artery disease without focal significant stenosis.  Due to the severe tortuosity in the right shoulder, I would not recommend using the right radial artery for cardiac catheterization if this was needed in the future.  LVEDP 16 mmHg. - Plan medical therapy with aggressive blood pressure control. She should continue with aggressive preventative therapy for coronary artery disease given the diffuse coronary plaquing that she has.  Echo 10/25/15:  - Left ventricle: The cavity size was normal. Wall thickness was increased in a pattern of mild LVH. Systolic function was normal. The estimated ejection fraction was in the range of 60% to 65%. Wall motion was normal; there were no regional wall motion abnormalities. Doppler parameters are consistent with abnormal left ventricular relaxation (grade 1 diastolic dysfunction). - Aortic valve: There was no stenosis. - Mitral valve: Mildly calcified annulus. There was no significant regurgitation. - Right ventricle: The cavity size was normal. Systolic function was normal. - Pulmonary arteries: No complete TR doppler jet so unable to estimate PA systolic pressure. - Inferior vena cava: The vessel was normal in size. The respirophasic diameter changes were in  the normal range (>= 50%), consistent with normal central venous pressure.  If no changes, I anticipate pt can proceed with surgery as scheduled.   Codie Hainer, FNP-BC [facility] Short Stay Surgical Center/Anesthesiology Phone: [phone] 03/14/2017 2:01 PM

## 2017-03-16 ENCOUNTER — Other Ambulatory Visit (INDEPENDENT_AMBULATORY_CARE_PROVIDER_SITE_OTHER): Payer: Self-pay | Admitting: Orthopedic Surgery

## 2017-03-16 DIAGNOSIS — Z96652 Presence of left artificial knee joint: Secondary | ICD-10-CM | POA: Diagnosis not present

## 2017-03-16 DIAGNOSIS — M1712 Unilateral primary osteoarthritis, left knee: Secondary | ICD-10-CM | POA: Diagnosis not present

## 2017-03-19 NOTE — H&P (Signed)
TOTAL KNEE ADMISSION H&P  Patient is being admitted for left total knee arthroplasty.  Subjective:  Chief Complaint:left knee pain.  HPI: Mary [place], 61 y.o. female, has a history of pain and functional disability in the left knee due to arthritis and has failed non-surgical conservative treatments for greater than 12 weeks to includeNSAID's and/or analgesics, corticosteriod injections, viscosupplementation injections, use of assistive devices and activity modification.  Onset of symptoms was gradual, starting >10 years ago with gradually worsening course since that time. The patient noted no past surgery on the left knee(s).  Patient currently rates pain in the left knee(s) at 9 out of 10 with activity. Patient has night pain, worsening of pain with activity and weight bearing, pain that interferes with activities of daily living, pain with passive range of motion, crepitus and joint swelling.  Patient has evidence of subchondral cysts, subchondral sclerosis, periarticular osteophytes and joint space narrowing by imaging studies. This patient has had No family history of pulmonary embolism or DVT. There is no active infection.  Patient Active Problem List   Diagnosis Date Noted  . Chronic pain of right knee 01/10/2017  . Primary osteoarthritis of right knee 01/10/2017  . Pain in right foot 01/10/2017  . Primary osteoarthritis of left knee 01/10/2017  . Hyperlipidemia 11/09/2015  . Essential hypertension   . Chest pain 10/24/2015  . NSTEMI (non-ST elevated myocardial infarction) ([place]) 10/24/2015  . Diarrhea 10/24/2015  . Elevated troponin 10/24/2015  . Hypothyroidism 10/24/2015  . Stroke ([place])   . Hypertensive urgency   . GERD (gastroesophageal reflux disease)   . Gastroesophageal reflux disease without esophagitis    Past Medical History:  Diagnosis Date  . Arthritis   . CAD (coronary artery disease), native coronary artery 10/2015   diffuse disease, but non obstructive  .  GERD (gastroesophageal reflux disease)   . Hypertension   . Hypothyroidism   . Metabolic syndrome   . Stroke [facility])     Past Surgical History:  Procedure Laterality Date  . CARDIAC CATHETERIZATION N/A 10/26/2015   Procedure: Left Heart Cath and Coronary Angiography;  Surgeon: Jettie Booze, MD;  Location: [place] [facility];  Service: Cardiovascular;  Laterality: N/A;  . CHOLECYSTECTOMY      No prescriptions prior to admission.   Allergies  Allergen Reactions  . No Known Allergies     Social History  Substance Use Topics  . Smoking status: Never Smoker  . Smokeless tobacco: Never Used  . Alcohol use No    Family History  Problem Relation Age of Onset  . Healthy Mother   . Healthy Father   . Healthy Brother   . Healthy Brother   . Healthy Brother      Review of Systems  Musculoskeletal: Positive for joint pain.  All other systems reviewed and are negative.   Objective:  Physical Exam  Constitutional: She appears well-developed.  HENT:  Head: Normocephalic.  Eyes: Pupils are equal, round, and reactive to light.  Neck: Normal range of motion.  Cardiovascular: Normal rate.   Respiratory: Effort normal.  Neurological: She is alert.  Skin: Skin is warm.  Psychiatric: She has a normal mood and affect.  Left knee demonstrates intact skin.  Palpable pedal pulses.  Slight flexion contracture with flexion past 90.  Collaterals are stable.  Extensor mechanism is intact.  No other masses lymph and appendectomy or skin changes noted in the right knee region or left knee region  Vital signs in last 24 hours:  Labs:   Estimated body mass index is 38.06 kg/m as calculated from the following:   Height as of 03/12/17: 5\' 6"  (1.676 m).   Weight as of 03/12/17: 235 lb 12.8 oz (107 kg).   Imaging Review Plain radiographs demonstrate severe degenerative joint disease of the bilaterally knee(s). The overall alignment ismild varus. The bone quality appears to be good for  age and reported activity level.  Assessment/Plan:  End stage arthritis, left knee   The patient history, physical examination, clinical judgment of the provider and imaging studies are consistent with end stage degenerative joint disease of the left knee(s) and total knee arthroplasty is deemed medically necessary. The treatment options including medical management, injection therapy arthroscopy and arthroplasty were discussed at length. The risks and benefits of total knee arthroplasty were presented and reviewed. The risks due to aseptic loosening, infection, stiffness, patella tracking problems, thromboembolic complications and other imponderables were discussed. The patient acknowledged the explanation, agreed to proceed with the plan and consent was signed. Patient is being admitted for inpatient treatment for surgery, pain control, PT, OT, prophylactic antibiotics, VTE prophylaxis, progressive ambulation and ADL's and discharge planning. The patient is planning to be discharged home with home health services

## 2017-03-20 ENCOUNTER — Inpatient Hospital Stay (HOSPITAL_COMMUNITY)
Admission: RE | Admit: 2017-03-20 | Discharge: 2017-03-23 | DRG: 470 | Disposition: A | Discharge status: Skilled Nursing Facility | Payer: BLUE CROSS/BLUE SHIELD | Source: Ambulatory Visit | Attending: Orthopedic Surgery | Admitting: Orthopedic Surgery

## 2017-03-20 ENCOUNTER — Inpatient Hospital Stay (HOSPITAL_COMMUNITY): Payer: BLUE CROSS/BLUE SHIELD | Admitting: Certified Registered Nurse Anesthetist

## 2017-03-20 ENCOUNTER — Encounter (HOSPITAL_COMMUNITY)
Admission: RE | Disposition: A | Discharge status: Skilled Nursing Facility | Payer: Self-pay | Source: Ambulatory Visit | Attending: Orthopedic Surgery

## 2017-03-20 ENCOUNTER — Inpatient Hospital Stay (HOSPITAL_COMMUNITY): Payer: BLUE CROSS/BLUE SHIELD | Admitting: Emergency Medicine

## 2017-03-20 ENCOUNTER — Encounter (HOSPITAL_COMMUNITY): Payer: Self-pay | Admitting: *Deleted

## 2017-03-20 DIAGNOSIS — I2510 Atherosclerotic heart disease of native coronary artery without angina pectoris: Secondary | ICD-10-CM | POA: Diagnosis not present

## 2017-03-20 DIAGNOSIS — S79919A Unspecified injury of unspecified hip, initial encounter: Secondary | ICD-10-CM | POA: Diagnosis not present

## 2017-03-20 DIAGNOSIS — E785 Hyperlipidemia, unspecified: Secondary | ICD-10-CM | POA: Diagnosis not present

## 2017-03-20 DIAGNOSIS — Z96652 Presence of left artificial knee joint: Secondary | ICD-10-CM | POA: Diagnosis not present

## 2017-03-20 DIAGNOSIS — M1712 Unilateral primary osteoarthritis, left knee: Secondary | ICD-10-CM

## 2017-03-20 DIAGNOSIS — Z8673 Personal history of transient ischemic attack (TIA), and cerebral infarction without residual deficits: Secondary | ICD-10-CM

## 2017-03-20 DIAGNOSIS — K219 Gastro-esophageal reflux disease without esophagitis: Secondary | ICD-10-CM | POA: Diagnosis present

## 2017-03-20 DIAGNOSIS — M1710 Unilateral primary osteoarthritis, unspecified knee: Secondary | ICD-10-CM | POA: Diagnosis present

## 2017-03-20 DIAGNOSIS — R1314 Dysphagia, pharyngoesophageal phase: Secondary | ICD-10-CM | POA: Diagnosis not present

## 2017-03-20 DIAGNOSIS — I252 Old myocardial infarction: Secondary | ICD-10-CM | POA: Diagnosis not present

## 2017-03-20 DIAGNOSIS — I160 Hypertensive urgency: Secondary | ICD-10-CM | POA: Diagnosis not present

## 2017-03-20 DIAGNOSIS — S8990XA Unspecified injury of unspecified lower leg, initial encounter: Secondary | ICD-10-CM | POA: Diagnosis not present

## 2017-03-20 DIAGNOSIS — M6281 Muscle weakness (generalized): Secondary | ICD-10-CM | POA: Diagnosis not present

## 2017-03-20 DIAGNOSIS — G8918 Other acute postprocedural pain: Secondary | ICD-10-CM | POA: Diagnosis not present

## 2017-03-20 DIAGNOSIS — E039 Hypothyroidism, unspecified: Secondary | ICD-10-CM | POA: Diagnosis present

## 2017-03-20 DIAGNOSIS — I10 Essential (primary) hypertension: Secondary | ICD-10-CM | POA: Diagnosis present

## 2017-03-20 DIAGNOSIS — Z471 Aftercare following joint replacement surgery: Secondary | ICD-10-CM | POA: Diagnosis not present

## 2017-03-20 DIAGNOSIS — R2689 Other abnormalities of gait and mobility: Secondary | ICD-10-CM | POA: Diagnosis not present

## 2017-03-20 HISTORY — PX: TOTAL KNEE ARTHROPLASTY: SHX125

## 2017-03-20 HISTORY — DX: Gout, unspecified: M10.9

## 2017-03-20 SURGERY — ARTHROPLASTY, KNEE, TOTAL
Anesthesia: General | Site: Knee | Laterality: Left

## 2017-03-20 MED ORDER — MORPHINE SULFATE (PF) 4 MG/ML IV SOLN
INTRAVENOUS | Status: AC
  Filled 2017-03-20: qty 1

## 2017-03-20 MED ORDER — ARTIFICIAL TEARS OP OINT
TOPICAL_OINTMENT | OPHTHALMIC | Status: DC | PRN
Start: 2017-03-20 — End: 2017-03-20
  Administered 2017-03-20: 1 via OPHTHALMIC

## 2017-03-20 MED ORDER — PROPOFOL 10 MG/ML IV BOLUS
INTRAVENOUS | Status: AC
  Filled 2017-03-20: qty 20

## 2017-03-20 MED ORDER — LACTATED RINGERS IV SOLN
INTRAVENOUS | Status: DC | PRN
  Administered 2017-03-20 (×2): via INTRAVENOUS

## 2017-03-20 MED ORDER — ACETAMINOPHEN 325 MG PO TABS
650.0000 mg | ORAL_TABLET | Freq: Four times a day (QID) | ORAL | Status: DC | PRN
  Administered 2017-03-22: 650 mg via ORAL
  Filled 2017-03-20: qty 2

## 2017-03-20 MED ORDER — FENTANYL CITRATE (PF) 100 MCG/2ML IJ SOLN
INTRAMUSCULAR | Status: AC
  Administered 2017-03-20: 25 ug via INTRAVENOUS
  Filled 2017-03-20: qty 2

## 2017-03-20 MED ORDER — SODIUM CHLORIDE 0.9% FLUSH
INTRAVENOUS | Status: DC | PRN
  Administered 2017-03-20: 20 mL

## 2017-03-20 MED ORDER — FENTANYL CITRATE (PF) 100 MCG/2ML IJ SOLN
INTRAMUSCULAR | Status: DC | PRN
  Administered 2017-03-20: 250 ug via INTRAVENOUS
  Administered 2017-03-20 (×2): 50 ug via INTRAVENOUS

## 2017-03-20 MED ORDER — MIDAZOLAM HCL 5 MG/5ML IJ SOLN
INTRAMUSCULAR | Status: DC | PRN
  Administered 2017-03-20: 2 mg via INTRAVENOUS

## 2017-03-20 MED ORDER — CEFAZOLIN SODIUM-DEXTROSE 2-4 GM/100ML-% IV SOLN
2.0000 g | Freq: Four times a day (QID) | INTRAVENOUS | Status: AC
  Administered 2017-03-20 (×2): 2 g via INTRAVENOUS
  Filled 2017-03-20 (×2): qty 100

## 2017-03-20 MED ORDER — POTASSIUM CHLORIDE IN NACL 20-0.9 MEQ/L-% IV SOLN
INTRAVENOUS | Status: AC
  Administered 2017-03-21: 06:00:00 via INTRAVENOUS
  Filled 2017-03-20 (×2): qty 1000

## 2017-03-20 MED ORDER — LIDOCAINE HCL (CARDIAC) 20 MG/ML IV SOLN
INTRAVENOUS | Status: DC | PRN
  Administered 2017-03-20: 100 mg via INTRAVENOUS

## 2017-03-20 MED ORDER — DOCUSATE SODIUM 100 MG PO CAPS
100.0000 mg | ORAL_CAPSULE | Freq: Two times a day (BID) | ORAL | Status: DC
  Administered 2017-03-20 – 2017-03-23 (×6): 100 mg via ORAL
  Filled 2017-03-20 (×6): qty 1

## 2017-03-20 MED ORDER — OXYCODONE HCL 5 MG PO TABS
5.0000 mg | ORAL_TABLET | ORAL | Status: DC | PRN
  Administered 2017-03-20 (×2): 10 mg via ORAL
  Administered 2017-03-20: 5 mg via ORAL
  Administered 2017-03-21 – 2017-03-22 (×5): 10 mg via ORAL
  Filled 2017-03-20: qty 2
  Filled 2017-03-20: qty 1
  Filled 2017-03-20 (×5): qty 2

## 2017-03-20 MED ORDER — TRANEXAMIC ACID 1000 MG/10ML IV SOLN
1000.0000 mg | INTRAVENOUS | Status: AC
  Administered 2017-03-20: 1000 mg via INTRAVENOUS
  Filled 2017-03-20: qty 10

## 2017-03-20 MED ORDER — MENTHOL 3 MG MT LOZG: 1.0000 | LOZENGE | OROMUCOSAL | Status: DC | PRN

## 2017-03-20 MED ORDER — METOCLOPRAMIDE HCL 5 MG/ML IJ SOLN: 5.0000 mg | Freq: Three times a day (TID) | INTRAMUSCULAR | Status: DC | PRN

## 2017-03-20 MED ORDER — PHENYLEPHRINE HCL 10 MG/ML IJ SOLN
INTRAMUSCULAR | Status: DC | PRN
  Administered 2017-03-20: 45 ug/min via INTRAVENOUS

## 2017-03-20 MED ORDER — MORPHINE SULFATE (PF) 4 MG/ML IV SOLN
INTRAVENOUS | Status: DC | PRN
  Administered 2017-03-20: 8 mg via SUBCUTANEOUS

## 2017-03-20 MED ORDER — METHOCARBAMOL 500 MG PO TABS
ORAL_TABLET | ORAL | Status: AC
  Filled 2017-03-20: qty 1

## 2017-03-20 MED ORDER — LEVOTHYROXINE SODIUM 112 MCG PO TABS
112.0000 ug | ORAL_TABLET | Freq: Every day | ORAL | Status: DC
  Administered 2017-03-21 – 2017-03-23 (×3): 112 ug via ORAL
  Filled 2017-03-20 (×3): qty 1

## 2017-03-20 MED ORDER — CHLORHEXIDINE GLUCONATE 4 % EX LIQD: 60.0000 mL | Freq: Once | CUTANEOUS | Status: DC

## 2017-03-20 MED ORDER — CEFAZOLIN SODIUM-DEXTROSE 2-4 GM/100ML-% IV SOLN
2.0000 g | INTRAVENOUS | Status: AC
  Administered 2017-03-20: 2 g via INTRAVENOUS
  Filled 2017-03-20: qty 100

## 2017-03-20 MED ORDER — OXYCODONE HCL 5 MG PO TABS
ORAL_TABLET | ORAL | Status: AC
  Filled 2017-03-20: qty 2

## 2017-03-20 MED ORDER — ONDANSETRON HCL 4 MG/2ML IJ SOLN
4.0000 mg | Freq: Four times a day (QID) | INTRAMUSCULAR | Status: DC | PRN
  Administered 2017-03-20: 4 mg via INTRAVENOUS
  Filled 2017-03-20 (×2): qty 2

## 2017-03-20 MED ORDER — CLONIDINE HCL (ANALGESIA) 100 MCG/ML EP SOLN
150.0000 ug | Freq: Once | EPIDURAL | Status: AC
  Administered 2017-03-20: 1 mL via INTRA_ARTICULAR
  Filled 2017-03-20: qty 1.5

## 2017-03-20 MED ORDER — ONDANSETRON HCL 4 MG/2ML IJ SOLN
INTRAMUSCULAR | Status: DC | PRN
  Administered 2017-03-20: 4 mg via INTRAVENOUS

## 2017-03-20 MED ORDER — PROPOFOL 10 MG/ML IV BOLUS
INTRAVENOUS | Status: AC
  Filled 2017-03-20: qty 40

## 2017-03-20 MED ORDER — BUPIVACAINE-EPINEPHRINE 0.5% -1:200000 IJ SOLN
INTRAMUSCULAR | Status: DC | PRN
  Administered 2017-03-20: 30 mL

## 2017-03-20 MED ORDER — GLYCOPYRROLATE 0.2 MG/ML IJ SOLN
INTRAMUSCULAR | Status: DC | PRN
  Administered 2017-03-20 (×2): 0.2 mg via INTRAVENOUS

## 2017-03-20 MED ORDER — DEXTROSE 5 % IV SOLN: INTRAVENOUS | Status: DC | PRN

## 2017-03-20 MED ORDER — BUPIVACAINE LIPOSOME 1.3 % IJ SUSP
20.0000 mL | Freq: Once | INTRAMUSCULAR | Status: AC
  Administered 2017-03-20: 20 mL
  Filled 2017-03-20: qty 20

## 2017-03-20 MED ORDER — METOCLOPRAMIDE HCL 5 MG PO TABS: 5.0000 mg | ORAL_TABLET | Freq: Three times a day (TID) | ORAL | Status: DC | PRN

## 2017-03-20 MED ORDER — METHOCARBAMOL 1000 MG/10ML IJ SOLN
500.0000 mg | Freq: Four times a day (QID) | INTRAVENOUS | Status: DC | PRN
  Filled 2017-03-20: qty 5

## 2017-03-20 MED ORDER — SODIUM CHLORIDE 0.9 % IR SOLN
Status: DC | PRN
Start: 2017-03-20 — End: 2017-03-20
  Administered 2017-03-20: 3000 mL

## 2017-03-20 MED ORDER — FENTANYL CITRATE (PF) 100 MCG/2ML IJ SOLN
100.0000 ug | Freq: Once | INTRAMUSCULAR | Status: AC
  Administered 2017-03-20: 50 ug via INTRAVENOUS
  Filled 2017-03-20: qty 2

## 2017-03-20 MED ORDER — PHENYLEPHRINE HCL 10 MG/ML IJ SOLN
INTRAMUSCULAR | Status: DC | PRN
  Administered 2017-03-20 (×2): 80 ug via INTRAVENOUS

## 2017-03-20 MED ORDER — RIVAROXABAN 10 MG PO TABS
10.0000 mg | ORAL_TABLET | Freq: Every day | ORAL | Status: DC
  Administered 2017-03-21 – 2017-03-23 (×3): 10 mg via ORAL
  Filled 2017-03-20 (×3): qty 1

## 2017-03-20 MED ORDER — ONDANSETRON HCL 4 MG PO TABS
4.0000 mg | ORAL_TABLET | Freq: Four times a day (QID) | ORAL | Status: DC | PRN
  Administered 2017-03-21: 4 mg via ORAL
  Filled 2017-03-20: qty 1

## 2017-03-20 MED ORDER — PHENOL 1.4 % MT LIQD: 1.0000 | OROMUCOSAL | Status: DC | PRN

## 2017-03-20 MED ORDER — FENTANYL CITRATE (PF) 250 MCG/5ML IJ SOLN
INTRAMUSCULAR | Status: AC
  Filled 2017-03-20: qty 5

## 2017-03-20 MED ORDER — DEXTROSE 5 % IV SOLN
INTRAVENOUS | Status: DC | PRN
  Administered 2017-03-20: 13:00:00 via INTRAVENOUS

## 2017-03-20 MED ORDER — MORPHINE SULFATE (PF) 4 MG/ML IV SOLN
4.0000 mg | INTRAVENOUS | Status: DC | PRN
Start: 2017-03-20 — End: 2017-03-23
  Administered 2017-03-20: 4 mg via INTRAVENOUS

## 2017-03-20 MED ORDER — CHLORHEXIDINE GLUCONATE 4 % EX LIQD
60.0000 mL | Freq: Once | CUTANEOUS | Status: DC
Start: 2017-03-20 — End: 2017-03-20

## 2017-03-20 MED ORDER — MORPHINE SULFATE (PF) 4 MG/ML IV SOLN
INTRAVENOUS | Status: AC
  Filled 2017-03-20: qty 2

## 2017-03-20 MED ORDER — HYDROCHLOROTHIAZIDE 12.5 MG PO CAPS
12.5000 mg | ORAL_CAPSULE | Freq: Every day | ORAL | Status: DC
  Administered 2017-03-20 – 2017-03-23 (×4): 12.5 mg via ORAL
  Filled 2017-03-20 (×4): qty 1

## 2017-03-20 MED ORDER — MIDAZOLAM HCL 2 MG/2ML IJ SOLN
INTRAMUSCULAR | Status: AC
  Filled 2017-03-20: qty 2

## 2017-03-20 MED ORDER — LACTATED RINGERS IV SOLN
INTRAVENOUS | Status: DC
  Administered 2017-03-20: 08:00:00 via INTRAVENOUS

## 2017-03-20 MED ORDER — SODIUM CHLORIDE 0.9 % IV SOLN
2000.0000 mg | Freq: Once | INTRAVENOUS | Status: AC
  Administered 2017-03-20: 2000 mg via TOPICAL
  Filled 2017-03-20: qty 20

## 2017-03-20 MED ORDER — 0.9 % SODIUM CHLORIDE (POUR BTL) OPTIME
TOPICAL | Status: DC | PRN
  Administered 2017-03-20: 3000 mL

## 2017-03-20 MED ORDER — ROCURONIUM BROMIDE 100 MG/10ML IV SOLN
INTRAVENOUS | Status: DC | PRN
  Administered 2017-03-20: 50 mg via INTRAVENOUS

## 2017-03-20 MED ORDER — ENSURE ENLIVE PO LIQD: 237.0000 mL | Freq: Two times a day (BID) | ORAL | Status: DC

## 2017-03-20 MED ORDER — METHOCARBAMOL 500 MG PO TABS
500.0000 mg | ORAL_TABLET | Freq: Four times a day (QID) | ORAL | Status: DC | PRN
  Administered 2017-03-20 – 2017-03-21 (×3): 500 mg via ORAL
  Filled 2017-03-20 (×3): qty 1

## 2017-03-20 MED ORDER — METOPROLOL TARTRATE 25 MG PO TABS
25.0000 mg | ORAL_TABLET | Freq: Two times a day (BID) | ORAL | Status: DC
  Administered 2017-03-21 – 2017-03-23 (×5): 25 mg via ORAL
  Filled 2017-03-20 (×6): qty 1

## 2017-03-20 MED ORDER — MIDAZOLAM HCL 2 MG/2ML IJ SOLN
2.0000 mg | Freq: Once | INTRAMUSCULAR | Status: AC
  Administered 2017-03-20: 2 mg via INTRAVENOUS
  Filled 2017-03-20: qty 2

## 2017-03-20 MED ORDER — SUGAMMADEX SODIUM 200 MG/2ML IV SOLN
INTRAVENOUS | Status: DC | PRN
  Administered 2017-03-20: 200 mg via INTRAVENOUS

## 2017-03-20 MED ORDER — ACETAMINOPHEN 650 MG RE SUPP: 650.0000 mg | Freq: Four times a day (QID) | RECTAL | Status: DC | PRN

## 2017-03-20 MED ORDER — BUPIVACAINE HCL (PF) 0.5 % IJ SOLN
INTRAMUSCULAR | Status: AC
  Filled 2017-03-20: qty 30

## 2017-03-20 MED ORDER — GABAPENTIN 300 MG PO CAPS
300.0000 mg | ORAL_CAPSULE | Freq: Three times a day (TID) | ORAL | Status: DC
  Administered 2017-03-20 – 2017-03-23 (×8): 300 mg via ORAL
  Filled 2017-03-20 (×8): qty 1

## 2017-03-20 MED ORDER — PROPOFOL 10 MG/ML IV BOLUS
INTRAVENOUS | Status: DC | PRN
  Administered 2017-03-20: 200 mg via INTRAVENOUS

## 2017-03-20 MED ORDER — FENTANYL CITRATE (PF) 100 MCG/2ML IJ SOLN
25.0000 ug | INTRAMUSCULAR | Status: DC | PRN
  Administered 2017-03-20: 50 ug via INTRAVENOUS
  Administered 2017-03-20 (×2): 25 ug via INTRAVENOUS

## 2017-03-20 SURGICAL SUPPLY — 82 items
BANDAGE ACE 4X5 VEL STRL LF (GAUZE/BANDAGES/DRESSINGS) ×1 IMPLANT
BANDAGE ESMARK 6X9 LF (GAUZE/BANDAGES/DRESSINGS) ×1 IMPLANT
BLADE SAG 18X100X1.27 (BLADE) ×2 IMPLANT
BLADE SAW SGTL 13.0X1.19X90.0M (BLADE) ×1 IMPLANT
BNDG CMPR 9X6 STRL LF SNTH (GAUZE/BANDAGES/DRESSINGS) ×1
BNDG CMPR MED 15X6 ELC VLCR LF (GAUZE/BANDAGES/DRESSINGS) ×1
BNDG COHESIVE 6X5 TAN STRL LF (GAUZE/BANDAGES/DRESSINGS) ×2 IMPLANT
BNDG ELASTIC 6X15 VLCR STRL LF (GAUZE/BANDAGES/DRESSINGS) ×4 IMPLANT
BNDG ESMARK 6X9 LF (GAUZE/BANDAGES/DRESSINGS) ×2
BOWL SMART MIX CTS (DISPOSABLE) ×1 IMPLANT
CAPT KNEE TOTAL 3 ×1 IMPLANT
CEMENT BONE SIMPLEX SPEEDSET (Cement) ×2 IMPLANT
CLSR STERI-STRIP ANTIMIC 1/2X4 (GAUZE/BANDAGES/DRESSINGS) ×1 IMPLANT
COVER SURGICAL LIGHT HANDLE (MISCELLANEOUS) ×2 IMPLANT
CUFF TOURNIQUET SINGLE 34IN LL (TOURNIQUET CUFF) ×2 IMPLANT
CUFF TOURNIQUET SINGLE 44IN (TOURNIQUET CUFF) IMPLANT
DECANTER SPIKE VIAL GLASS SM (MISCELLANEOUS) ×2 IMPLANT
DRAPE INCISE IOBAN 66X45 STRL (DRAPES) ×1 IMPLANT
DRAPE ORTHO SPLIT 77X108 STRL (DRAPES) ×6
DRAPE SURG ORHT 6 SPLT 77X108 (DRAPES) ×3 IMPLANT
DRAPE U-SHAPE 47X51 STRL (DRAPES) ×2 IMPLANT
DRSG AQUACEL AG ADV 3.5X10 (GAUZE/BANDAGES/DRESSINGS) ×2 IMPLANT
DRSG AQUACEL AG ADV 3.5X14 (GAUZE/BANDAGES/DRESSINGS) ×1 IMPLANT
DRSG PAD ABDOMINAL 8X10 ST (GAUZE/BANDAGES/DRESSINGS) ×4 IMPLANT
DURAPREP 26ML APPLICATOR (WOUND CARE) ×4 IMPLANT
ELECT CAUTERY BLADE 6.4 (BLADE) ×2 IMPLANT
ELECT REM PT RETURN 9FT ADLT (ELECTROSURGICAL) ×2
ELECTRODE REM PT RTRN 9FT ADLT (ELECTROSURGICAL) ×1 IMPLANT
GAUZE SPONGE 4X4 12PLY STRL (GAUZE/BANDAGES/DRESSINGS) ×2 IMPLANT
GLOVE BIO SURGEON STRL SZ 6.5 (GLOVE) ×1 IMPLANT
GLOVE BIOGEL PI IND STRL 6.5 (GLOVE) IMPLANT
GLOVE BIOGEL PI IND STRL 7.5 (GLOVE) ×1 IMPLANT
GLOVE BIOGEL PI IND STRL 8 (GLOVE) ×1 IMPLANT
GLOVE BIOGEL PI INDICATOR 6.5 (GLOVE) ×1
GLOVE BIOGEL PI INDICATOR 7.5 (GLOVE) ×1
GLOVE BIOGEL PI INDICATOR 8 (GLOVE) ×1
GLOVE ECLIPSE 7.0 STRL STRAW (GLOVE) ×2 IMPLANT
GLOVE SURG ORTHO 8.0 STRL STRW (GLOVE) ×2 IMPLANT
GOWN STRL REUS W/ TWL LRG LVL3 (GOWN DISPOSABLE) ×3 IMPLANT
GOWN STRL REUS W/TWL LRG LVL3 (GOWN DISPOSABLE) ×8
HANDPIECE INTERPULSE COAX TIP (DISPOSABLE) ×2
HOOD PEEL AWAY FLYTE STAYCOOL (MISCELLANEOUS) ×6 IMPLANT
IMMOBILIZER KNEE 20 (SOFTGOODS) IMPLANT
IMMOBILIZER KNEE 22 UNIV (SOFTGOODS) IMPLANT
IMMOBILIZER KNEE 24 THIGH 36 (MISCELLANEOUS) IMPLANT
IMMOBILIZER KNEE 24 UNIV (MISCELLANEOUS)
KIT BASIN OR (CUSTOM PROCEDURE TRAY) ×2 IMPLANT
KIT ROOM TURNOVER OR (KITS) ×2 IMPLANT
MANIFOLD NEPTUNE II (INSTRUMENTS) ×2 IMPLANT
NDL HYPO 25GX1X1/2 BEV (NEEDLE) IMPLANT
NDL SAFETY ECLIPSE 18X1.5 (NEEDLE) ×1 IMPLANT
NDL SPNL 18GX3.5 QUINCKE PK (NEEDLE) ×1 IMPLANT
NEEDLE 22X1 1/2 (OR ONLY) (NEEDLE) ×2 IMPLANT
NEEDLE HYPO 18GX1.5 SHARP (NEEDLE) ×2
NEEDLE HYPO 22GX1.5 SAFETY (NEEDLE) ×2 IMPLANT
NEEDLE HYPO 25GX1X1/2 BEV (NEEDLE) ×2 IMPLANT
NEEDLE SPNL 18GX3.5 QUINCKE PK (NEEDLE) ×2 IMPLANT
NS IRRIG 1000ML POUR BTL (IV SOLUTION) ×4 IMPLANT
PACK TOTAL JOINT (CUSTOM PROCEDURE TRAY) ×2 IMPLANT
PAD ARMBOARD 7.5X6 YLW CONV (MISCELLANEOUS) ×4 IMPLANT
PAD CAST 4YDX4 CTTN HI CHSV (CAST SUPPLIES) ×1 IMPLANT
PADDING CAST COTTON 4X4 STRL (CAST SUPPLIES) ×2
PADDING CAST COTTON 6X4 STRL (CAST SUPPLIES) ×2 IMPLANT
SET HNDPC FAN SPRY TIP SCT (DISPOSABLE) IMPLANT
SPONGE LAP 18X18 X RAY DECT (DISPOSABLE) IMPLANT
STEM CEMENTED TRIATHLON (Stem) ×1 IMPLANT
STRIP CLOSURE SKIN 1/2X4 (GAUZE/BANDAGES/DRESSINGS) ×4 IMPLANT
SUCTION FRAZIER HANDLE 10FR (MISCELLANEOUS) ×1
SUCTION TUBE FRAZIER 10FR DISP (MISCELLANEOUS) ×1 IMPLANT
SUT MNCRL AB 3-0 PS2 18 (SUTURE) ×2 IMPLANT
SUT VIC AB 0 CT1 27 (SUTURE) ×8
SUT VIC AB 0 CT1 27XBRD ANBCTR (SUTURE) ×3 IMPLANT
SUT VIC AB 1 CT1 27 (SUTURE) ×14
SUT VIC AB 1 CT1 27XBRD ANBCTR (SUTURE) ×5 IMPLANT
SUT VIC AB 2-0 CT1 27 (SUTURE) ×10
SUT VIC AB 2-0 CT1 TAPERPNT 27 (SUTURE) ×4 IMPLANT
SYR 30ML LL (SYRINGE) ×6 IMPLANT
SYR TB 1ML LUER SLIP (SYRINGE) ×2 IMPLANT
TOWEL OR 17X24 6PK STRL BLUE (TOWEL DISPOSABLE) ×4 IMPLANT
TOWEL OR 17X26 10 PK STRL BLUE (TOWEL DISPOSABLE) ×4 IMPLANT
TRAY CATH 16FR W/PLASTIC CATH (SET/KITS/TRAYS/PACK) IMPLANT
WATER STERILE IRR 1000ML POUR (IV SOLUTION) IMPLANT

## 2017-03-20 NOTE — Anesthesia Procedure Notes (Signed)
Anesthesia Regional Block: Adductor canal block   Pre-Anesthetic Checklist: ,, timeout performed, Correct Patient, Correct Site, Correct Laterality, Correct Procedure, Correct Position, site marked, Risks and benefits discussed,  Surgical consent,  Pre-op evaluation,  At surgeon's request and post-op pain management  Laterality: Left  Prep: chloraprep        Narrative:  Start time: 03/20/2017 11:00 AM End time: 03/20/2017 11:15 AM Injection made incrementally with aspirations every 5 mL.  Performed by: Personally  Anesthesiologist: Belinda Block

## 2017-03-20 NOTE — Brief Op Note (Signed)
03/20/2017  3:02 PM  PATIENT:  [name]  61 y.o. female  PRE-OPERATIVE DIAGNOSIS:  LEFT KNEE OSTEOARTHRITIS  POST-OPERATIVE DIAGNOSIS:  LEFT KNEE OSTEOARTHRITIS  PROCEDURE:  Procedure(s): TOTAL KNEE ARTHROPLASTY  SURGEON:  Surgeon(s): Meredith Pel, MD  ASSISTANT: Laure Kidney rnfa  ANESTHESIA:   general  EBL: 100 ml    Total I/O In: 1700 [I.V.:1700] Out: -   BLOOD ADMINISTERED: none  DRAINS: none   LOCAL MEDICATIONS USED:  Marcaine mso4 clonidine exparel  SPECIMEN:  No Specimen  COUNTS:  YES  TOURNIQUET:   Total Tourniquet Time Documented: Thigh (Left) - 77 minutes Total: Thigh (Left) - 77 minutes   DICTATION: .Other Dictation: Dictation Number [id number]  PLAN OF CARE: Admit to inpatient   PATIENT DISPOSITION:  PACU - hemodynamically stable

## 2017-03-20 NOTE — Progress Notes (Signed)
Orthopedic Tech Progress Note Patient Details:  Mary Dalton 10-12-1956 [id number]  CPM Left Knee CPM Left Knee: On Left Knee Flexion (Degrees): 40 Left Knee Extension (Degrees): 10 Additional Comments: Foot roll   Mary Dalton 03/20/2017, 4:08 PM

## 2017-03-20 NOTE — Anesthesia Preprocedure Evaluation (Addendum)
Anesthesia Evaluation  Patient identified by MRN, date of birth, ID band Patient awake    Reviewed: Allergy & Precautions, NPO status   Airway Mallampati: II  TM Distance: >3 FB     Dental   Pulmonary neg pulmonary ROS,    breath sounds clear to auscultation       Cardiovascular hypertension, + CAD and + Past MI   Rhythm:Regular Rate:Normal     Neuro/Psych    GI/Hepatic Neg liver ROS, GERD  ,  Endo/Other  Hypothyroidism   Renal/GU negative Renal ROS     Musculoskeletal  (+) Arthritis ,   Abdominal   Peds  Hematology   Anesthesia Other Findings   Reproductive/Obstetrics                             Anesthesia Physical Anesthesia Plan  ASA: III  Anesthesia Plan: General   Post-op Pain Management:  Regional for Post-op pain   Induction: Intravenous  Airway Management Planned: Oral ETT  Additional Equipment:   Intra-op Plan:   Post-operative Plan: Extubation in OR  Informed Consent: I have reviewed the patients History and Physical, chart, labs and discussed the procedure including the risks, benefits and alternatives for the proposed anesthesia with the patient or authorized representative who has indicated his/her understanding and acceptance.   Dental advisory given  Plan Discussed with: CRNA  Anesthesia Plan Comments:        Anesthesia Quick Evaluation

## 2017-03-20 NOTE — Anesthesia Procedure Notes (Signed)
Procedure Name: Intubation Date/Time: 03/20/2017 12:56 PM Performed by: Jacquiline Doe A Pre-anesthesia Checklist: Patient identified, Emergency Drugs available, Suction available and Patient being monitored Patient Re-evaluated:Patient Re-evaluated prior to inductionOxygen Delivery Method: Circle System Utilized and Circle system utilized Preoxygenation: Pre-oxygenation with 100% oxygen Intubation Type: IV induction Ventilation: Mask ventilation without difficulty Laryngoscope Size: Mac and 4 Grade View: Grade I Tube type: Oral Tube size: 7.5 mm Number of attempts: 1 Airway Equipment and Method: Stylet Placement Confirmation: ETT inserted through vocal cords under direct vision,  positive ETCO2 and breath sounds checked- equal and bilateral Secured at: 22 cm Tube secured with: Tape Dental Injury: Teeth and Oropharynx as per pre-operative assessment

## 2017-03-20 NOTE — Transfer of Care (Signed)
Immediate Anesthesia Transfer of Care Note  Patient: Mary Dalton  Procedure(s) Performed: Procedure(s): TOTAL KNEE ARTHROPLASTY (Left)  Patient Location: PACU  Anesthesia Type:General  Level of Consciousness: drowsy and patient cooperative  Airway & Oxygen Therapy: Patient Spontanous Breathing and Patient connected to nasal cannula oxygen  Post-op Assessment: Report given to RN, Post -op Vital signs reviewed and stable and Patient moving all extremities  Post vital signs: Reviewed and stable  Last Vitals:  Vitals:   03/20/17 1155 03/20/17 1210  BP:    Pulse: (!) 50 (!) 53  Resp: 20 (!) 21  Temp:      Last Pain:  Vitals:   03/20/17 0801  TempSrc: Oral         Complications: No apparent anesthesia complications

## 2017-03-20 NOTE — Progress Notes (Signed)
Pt admitted to the unit from pacu; pt alert and verbally responsive; pt oriented to the unit and room; fall/safety precaution and prevention education completed. Pt's IV intact and transfusing; CPM On; skin intact with no pressure ulcer or open wounds noted except for left knee surgical incision with ace wrap clean, dry and intact. Pt in bed with call light within reach, bed alarm on and will closely monitor. Delia Heady RN

## 2017-03-20 NOTE — Anesthesia Postprocedure Evaluation (Signed)
Anesthesia Post Note  Patient: Mary Dalton  Procedure(s) Performed: Procedure(s) (LRB): TOTAL KNEE ARTHROPLASTY (Left)  Patient location during evaluation: PACU Anesthesia Type: General Level of consciousness: awake Pain management: pain level controlled Vital Signs Assessment: post-procedure vital signs reviewed and stable Respiratory status: spontaneous breathing Cardiovascular status: stable Anesthetic complications: no       Last Vitals:  Vitals:   03/20/17 1715 03/20/17 1731  BP:  118/68  Pulse: (!) 49 (!) 51  Resp: 15 16  Temp: 36.7 C 36.5 C    Last Pain:  Vitals:   03/20/17 1731  TempSrc: Oral  PainSc: Asleep                 Adelina Collard

## 2017-03-20 NOTE — Interval H&P Note (Signed)
History and Physical Interval Note:  03/20/2017 12:25 PM  Mary Dalton  has presented today for surgery, with the diagnosis of LEFT KNEE OSTEOARTHRITIS  The various methods of treatment have been discussed with the patient and family. After consideration of risks, benefits and other options for treatment, the patient has consented to  Procedure(s): TOTAL KNEE ARTHROPLASTY (Left) as a surgical intervention .  The patient's history has been reviewed, patient examined, no change in status, stable for surgery.  I have reviewed the patient's chart and labs.  Questions were answered to the patient's satisfaction.     Anderson Malta

## 2017-03-21 ENCOUNTER — Encounter (HOSPITAL_COMMUNITY): Payer: Self-pay | Admitting: Orthopedic Surgery

## 2017-03-21 MED ORDER — ENSURE ENLIVE PO LIQD: 237.0000 mL | Freq: Every day | ORAL | Status: DC

## 2017-03-21 NOTE — Progress Notes (Signed)
Physical Therapy Treatment Patient Details Name: Mary Dalton MRN: [id number] DOB: [birth year] Today's Date: 03/21/2017    History of Present Illness Pt is a 61 yo female with complaints of L knee pain secondary to OA, s/p 03/20/17 L TKA,. PMH OA, CAD, HTN, hypothyroid, Stroke, NSTEMI    PT Comments    Pt is making fair progress towards her goals but is limited by dizziness and fatigue with exertion. Pt good with bed exercises and min A for bed mobility. Pt modA for transfers with RW and ambulation of 3 feet with RW. Pt requires skilled PT to progress transfer, and gait training as well as improving strength and endurance to be safe in her discharge environment.     Follow Up Recommendations  SNF;Supervision/Assistance - 24 hour     Equipment Recommendations  Rolling walker with 5" wheels;3in1 (PT)    Recommendations for Other Services OT consult     Precautions / Restrictions Precautions Precautions: Knee Precaution Booklet Issued: Yes (comment) Precaution Comments: KI Required Braces or Orthoses: Knee Immobilizer - Left Restrictions Weight Bearing Restrictions: Yes LLE Weight Bearing: Weight bearing as tolerated    Mobility  Bed Mobility Overal bed mobility: Needs Assistance Bed Mobility: Supine to Sit;Sit to Supine     Supine to sit: Min assist;HOB elevated     General bed mobility comments: minAx1 for management of LE to floor ans pad scoot of hips to EoB  Transfers Overall transfer level: Needs assistance   Transfers: Sit to/from Stand Sit to Stand: Mod assist         General transfer comment: modA for sit>stand to RW and sit<>stand from [facility] to RW vc for hand placement to RW and chair as well as to extend knee when sitting down  Ambulation/Gait Ambulation/Gait assistance: Mod assist Ambulation Distance (Feet): 3 Feet Assistive device: Rolling walker (2 wheeled) Gait Pattern/deviations: Step-to pattern;Decreased step length - right;Decreased stance  time - left;Trunk flexed Gait velocity: slowed Gait velocity interpretation: Below normal speed for age/gender General Gait Details: pt very antalgic and became very lethargic with ambulation, vc for upright posture and to stay inside the RW while backing up to the chair         Balance Overall balance assessment: Needs assistance Sitting-balance support: Bilateral upper extremity supported;Feet supported Sitting balance-Leahy Scale: Fair Sitting balance - Comments: pt required support throughout to maintain her balance seated EoB   Standing balance support: During functional activity Standing balance-Leahy Scale: Poor Standing balance comment: pt able to hold standing with minA                            Cognition Arousal/Alertness: Awake/alert Behavior During Therapy: WFL for tasks assessed/performed Overall Cognitive Status: Within Functional Limits for tasks assessed                                        Exercises Total Joint Exercises Ankle Circles/Pumps: AROM;Both;10 reps;Supine Quad Sets: AROM;Both;10 reps;Supine Gluteal Sets: AROM;Both;10 reps;Supine Short Arc Quad: AAROM;Left;10 reps;Supine Heel Slides: AAROM;Left;10 reps;Supine Hip ABduction/ADduction: AAROM;Left;10 reps;Supine Straight Leg Raises: AAROM;Left;10 reps;Supine Goniometric ROM: 8 to 73 degrees measured in bed    General Comments General comments (skin integrity, edema, etc.): Pt became very fatigued with short ambulation distance no drop in BP.      Pertinent Vitals/Pain Pain Assessment: Faces Faces Pain Scale: Hurts even more  Pain Location: L hip  Pain Descriptors / Indicators: Guarding;Grimacing Pain Intervention(s): Monitored during session;Repositioned  BP in supine prior to activity 110/57 BP seated in recliner 112/62 Pt on 2 L O2 via nasal cannula at entry SaO2 96%O2, with activity on RA SaO2 dropped to 87%O2, 2L O2 replaced when in recliner and SaO2 rose to 93%  O2.            PT Goals (current goals can now be found in the care plan section) Acute Rehab PT Goals Patient Stated Goal: go home PT Goal Formulation: With patient Time For Goal Achievement: 03/28/17 Potential to Achieve Goals: Fair    Frequency    7X/week      PT Plan Current plan remains appropriate       End of Session         PT Visit Diagnosis: Unsteadiness on feet (R26.81);Other abnormalities of gait and mobility (R26.89);Dizziness and giddiness (R42);Pain Pain - Right/Left: Left Pain - part of body: Knee     Time: 9163-8466 PT Time Calculation (min) (ACUTE ONLY): 41 min  Charges:  $Therapeutic Exercise: 23-37 mins $Therapeutic Activity: 8-22 mins                    G Codes:       Carolene Gitto B. Migdalia Dk PT, DPT Acute Rehabilitation  [phone] Pager [phone]     [place] 03/21/2017, 3:36 PM

## 2017-03-21 NOTE — Progress Notes (Signed)
Orthopedic Tech Progress Note Patient Details:  Mary Dalton 1955-12-16 [id number]  Patient ID: [name], female   DOB: [birth year], 61 y.o.   MRN: [id number]   [name] 03/21/2017, 1:38 PM Placed pt's lle on cpm @0 -40  Degrees@1335 ; RN notified

## 2017-03-21 NOTE — Progress Notes (Signed)
Initial Nutrition Assessment  DOCUMENTATION CODES:   Obesity unspecified  INTERVENTION:  Provide Ensure Enlive po once daily, each supplement provides 350 kcal and 20 grams of protein.  Encourage adequate PO intake.   NUTRITION DIAGNOSIS:   Increased nutrient needs related to  (post op healing) as evidenced by estimated needs.  GOAL:   Patient will meet greater than or equal to 90% of their needs  MONITOR:   PO intake, Supplement acceptance, Labs, Weight trends, Skin, I & O's  REASON FOR ASSESSMENT:   Malnutrition Screening Tool    ASSESSMENT:   61 y.o. female, has a history of pain and functional disability in the left knee due to arthritis and has failed non-surgical conservative treatments for greater than 12 weeks  PMH includes:  CAD (mild to moderate diffuses nonobstructive 10/26/15 cath), stroke, HTN, metabolic syndrome, hypothyroidism, GERD  PROCEDURE (4/17): TOTAL KNEE ARTHROPLASTY (Left)  Pt reports having a good appetite currently and PTA with usual consumption of at least 3 meals a day. No meal completion recorded this AM. Noted family had brought in [employer] for patient to consume. Pt with no significant weight loss per weight records, however pt reports having 62 lbs weight loss over the past 1 year (weight loss also found not significant). Pt currently has Ensure ordered. RD to modify orders to once daily as pt reports eating well. Pt does report constipation. Colace is ordered.   Pt with no observe significant fat or muscle mass loss.   Labs and medications reviewed.   Diet Order:  Diet regular Room service appropriate? Yes; Fluid consistency: Thin  Skin:   (Incision on L knee)  Last BM:  4/16  Height:   Ht Readings from Last 1 Encounters:  03/12/17 5\' 6"  (1.676 m)    Weight:   Wt Readings from Last 1 Encounters:  03/20/17 235 lb (106.6 kg)    Ideal Body Weight:  59 kg  BMI:  Body mass index is 37.93 kg/m.  Estimated Nutritional Needs:    Kcal:  7673-4193  Protein:  90-110 grams  Fluid:  1.7 - 1.9 L/day  EDUCATION NEEDS:   No education needs identified at this time  Corrin Parker, MS, RD, LDN Pager # [phone] After hours/ weekend pager # [phone]

## 2017-03-21 NOTE — Op Note (Signed)
NAME:  Mary Dalton, Mary Dalton NO.:  [id number]  MEDICAL RECORD NO.:  [id number]  LOCATION:  [facility]                         FACILITY:  [facility]  PHYSICIAN:  Mary Dalton, M.D.    DATE OF BIRTH:  February 02, 1956  DATE OF PROCEDURE: DATE OF DISCHARGE:                              OPERATIVE REPORT   PREOPERATIVE DIAGNOSIS:  Left knee arthritis.  POSTOPERATIVE DIAGNOSIS:  Left knee arthritis.  PROCEDURE:  Left total knee replacement using posterior cruciate sacrificing, Stryker cemented 4 femur, 5 tibia with 50 mm stem, 11 mm polyethylene insert, 32 mm 3-peg patella.  SURGEON:  Mary Dalton, M.D.  ASSISTANT:  Mary Dalton, RNFA.  INDICATIONS:  [name] is a 61 year old female with end-stage left knee arthritis, who presents for operative management after explanation of risks and benefits.  PROCEDURE IN DETAIL:  The patient was brought to the operating room where general anesthetic was induced.  Preoperative antibiotics administered.  Time-out was called.  Left leg was prescrubbed with alcohol and Betadine, allowed to air dry, prepped with DuraPrep solution and draped in sterile manner.  Mary Dalton was used to cover the operative field.  Leg was elevated, exsanguinated with the Esmarch wrap. Tourniquet was inflated to 300 mmHg for a total tourniquet time of 77 minutes.  Anterior approach to knee was made.  Skin and subcutaneous tissue were sharply divided.  The patient had a preoperative flexion contracture of about 10 degrees with varus alignment.  Median parapatellar approach was made and marked with a #1 Vicryl suture. Patella was everted.  Soft tissue removed from the anterior distal femur.  Lateral patellofemoral ligament was released.  Minimal soft tissue dissection was performed about 2 cm below the joint line on the medial side.  Fat pad was partially excised.  ACL and PCL were released. With the posterior neurovascular structures protected and the collaterals protected,  intramedullary alignment was then used to make a cut perpendicular to the mechanical axis taking 9 off the least affected tibial plateau laterally.  Intramedullary alignment was then used to cut in 5 degrees valgus on the femur.  This allowed for a 9 and 11 mm spacer to fit nicely with slight hyperextension.  At this time, the chamfer and box cuts were made.  Femur sized to a size 4.  The tibia was then prepared with using the keel punch and was also prepared for stem. Trial components were placed and the 11 mm spacer gave about 2-3 degrees of hyperextension, excellent stability, varus and valgus stress at 0, 30, and 90 degrees with excellent patellar tracking of the native patella.  The native patella was then prepared cutting from 24-13 and replaced with 10 mm of a patellar trial.  With the patellar trial in position, again, excellent patellar tracking using no thumbs technique occurred.  Trial components were removed.  3 L of irrigating solution were utilized.  Solution of Marcaine, saline and Exparel were injected into the capsule.  Tranexamic acid sponge was then allowed to sit in the knee for 3 minutes.  Components were then cemented into position with same stability parameters maintained.  Excess cement was removed. Thorough irrigation was then performed.  Tourniquet released.  Bleeding points  were encountered and controlled with electrocautery.  Incision was closed over bolster using #1 Vicryl suture to reapproximate the arthrotomy followed by 0 Vicryl suture and 3- 0 Monocryl.  The patient tolerated the procedure well without immediate complication.  Aquacel dressing placed.  Ace wraps placed.  Knee immobilizer placed.     Mary Dalton, M.D.     GSD/MEDQ  D:  03/20/2017  T:  03/21/2017  Job:  [id number]

## 2017-03-21 NOTE — Discharge Instructions (Signed)

## 2017-03-21 NOTE — Progress Notes (Signed)
Subjective: Patient stable pain is controlled   Objective: Vital signs in last 24 hours: Temp:  [97.7 F (36.5 C)-98.9 F (37.2 C)] 98.9 F (37.2 C) (04/18 0535) Pulse Rate:  [44-63] 60 (04/18 0535) Resp:  [12-23] 19 (04/18 0535) BP: (108-173)/(60-86) 130/60 (04/18 0535) SpO2:  [99 %-100 %] 99 % (04/18 0535) Weight:  [235 lb (106.6 kg)] 235 lb (106.6 kg) (04/17 0801)  Intake/Output from previous day: 04/17 0701 - 04/18 0700 In: 2333.3 [I.V.:2233.3; IV Piggyback:100] Out: 175 [Urine:150; Blood:25] Intake/Output this shift: No intake/output data recorded.  Exam:  Dorsiflexion/Plantar flexion intact  Labs: No results for input(s): HGB in the last 72 hours. No results for input(s): WBC, RBC, HCT, PLT in the last 72 hours. No results for input(s): NA, K, CL, CO2, BUN, CREATININE, GLUCOSE, CALCIUM in the last 72 hours. No results for input(s): LABPT, INR in the last 72 hours.  Assessment/Plan: Plan is for physical therapy and CPM today.  She is making good progress.   G Scott Jiro Kiester 03/21/2017, 7:25 AM

## 2017-03-21 NOTE — Evaluation (Signed)
Physical Therapy Evaluation Patient Details Name: Mary Dalton MRN: [id number] DOB: 06-13-1956 Today's Date: 03/21/2017   History of Present Illness  Pt is a 61 yo female with complaints of L knee pain secondary to OA, s/p 03/20/17 L TKA,. PMH OA, CAD, HTN, hypothyroid, Stroke, NSTEMI  Clinical Impression  Pt is s/p TKA resulting in the deficits listed below (see PT Problem List). Pt is modAx1 for bed mobility and maxAx2 for squat pivot transfers. Pt with c/o of dizziness on coming upright due to orthostatic hypotension.  Pt will benefit from skilled PT to increase their independence and safety with mobility to allow discharge to the venue listed below.      Follow Up Recommendations SNF;Supervision/Assistance - 24 hour    Equipment Recommendations  Rolling walker with 5" wheels;3in1 (PT)    Recommendations for Other Services OT consult     Precautions / Restrictions Precautions Precautions: Knee Precaution Booklet Issued: Yes (comment) Precaution Comments: KI Required Braces or Orthoses: Knee Immobilizer - Left Restrictions Weight Bearing Restrictions: Yes LLE Weight Bearing: Weight bearing as tolerated      Mobility  Bed Mobility Overal bed mobility: Needs Assistance Bed Mobility: Supine to Sit;Sit to Supine     Supine to sit: Mod assist;HOB elevated Sit to supine: Max assist;+2 for physical assistance   General bed mobility comments: Pt with modAx1 for managing L LE to floor, sit>supine maxAx2 secondary to orthostasis   Transfers Overall transfer level: Needs assistance   Transfers: Squat Pivot Transfers     Squat pivot transfers: Max assist;+2 physical assistance     General transfer comment: Pt required maxAx2 to trandfer to [facility] and back, pt with c/o of dizziness throughout      Balance Overall balance assessment: Needs assistance Sitting-balance support: Bilateral upper extremity supported;Feet supported Sitting balance-Leahy Scale: Fair Sitting  balance - Comments: pt required support throughout to maintain her balance seated EoB   Standing balance support: During functional activity Standing balance-Leahy Scale: Zero                               Pertinent Vitals/Pain Pain Assessment: 0-10 Pain Score: 0-No pain Pain Intervention(s): Premedicated before session  Pt on RA throughout and SaO2 >95% BP in sitting EoB 98/54, BP 79/51 after transfer to [facility], and 100/53 once supine in bed     Home Living Family/patient expects to be discharged to:: Private residence Living Arrangements: Alone Available Help at Discharge: Family;Available 24 hours/day Type of Home: House Home Access: Stairs to enter Entrance Stairs-Rails: Right Entrance Stairs-Number of Steps: 2 Home Layout: One level Home Equipment: Bedside commode;Walker - 2 wheels      Prior Function Level of Independence: Independent         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[employer]        Extremity/Trunk Assessment   Upper Extremity Assessment Upper Extremity Assessment: Defer to OT evaluation    Lower Extremity Assessment Lower Extremity Assessment: LLE deficits/detail LLE Deficits / Details: L hip and knee ROM limited due to surgical pain LLE: Unable to fully assess due to pain;Unable to fully assess due to immobilization       Communication   Communication: No difficulties  Cognition Arousal/Alertness: Awake/alert Behavior During Therapy: WFL for tasks assessed/performed  General Comments General comments (skin integrity, edema, etc.): Pt became orthostatic once EoB. Pt daughter in room throughout session     Assessment/Plan    PT Assessment Patient needs continued PT services  PT Problem List Decreased strength;Decreased range of motion;Decreased activity tolerance;Decreased mobility;Decreased balance;Decreased knowledge of use of DME;Decreased safety  awareness;Obesity;Pain       PT Treatment Interventions DME instruction;Gait training;Stair training;Functional mobility training;Therapeutic activities;Therapeutic exercise;Balance training;Patient/family education    PT Goals (Current goals can be found in the Care Plan section)  Acute Rehab PT Goals Patient Stated Goal: go home PT Goal Formulation: With patient Time For Goal Achievement: 03/28/17 Potential to Achieve Goals: Fair    Frequency 7X/week    End of Session         PT Visit Diagnosis: Unsteadiness on feet (R26.81);Other abnormalities of gait and mobility (R26.89);Dizziness and giddiness (R42);Pain Pain - Right/Left: Left Pain - part of body: Knee    Time: 0821-0904 PT Time Calculation (min) (ACUTE ONLY): 43 min   Charges:   PT Evaluation $PT Eval Low Complexity: 1 Procedure PT Treatments $Therapeutic Activity: 23-37 mins   PT G Codes:        Dainel Arcidiacono B. Migdalia Dk PT, DPT Acute Rehabilitation  [phone] Pager [phone]    [place] 03/21/2017, 10:05 AM

## 2017-03-21 NOTE — Plan of Care (Signed)
Problem: Education: Goal: Knowledge of [facility] Education information/materials will improve Outcome: Progressing POC reviewed with pt./family.

## 2017-03-22 NOTE — Clinical Social Work Note (Signed)
Clinical Social Work Assessment  Patient Details  Name: Mary Dalton MRN: [id number] Date of Birth: 07-Jan-1956  Date of referral:  03/21/17               Reason for consult:  Facility Placement                Permission sought to share information with:  Chartered certified accountant granted to share information::  Yes, Verbal Permission Granted  Name::     Son, Daughter  Agency::  SNF  Relationship::     Contact Information:     Housing/Transportation Living arrangements for the past 2 months:  Single Family Home Source of Information:  Patient, Adult Children Patient Interpreter Needed:  None Criminal Activity/Legal Involvement Pertinent to Current Situation/Hospitalization:  No - Comment as needed Significant Relationships:  Adult Children Lives with:  Adult Children, Self Do you feel safe going back to the place where you live?  Yes Need for family participation in patient care:  No (Coment)  Care giving concerns:  Patient resided at home with daughter prior to hospitalization.  Patient indicated that she was independent with ADL's until her continued knee issues.  She has supportive family and has adult daughter at home for support.  Patient may not be safe to return home due to physical impairment.  Social Worker assessment / plan:  CSW met with patient, son and daughter at bedside.  Patient was sitting in chair, Axo4.  CSW discussed DC plans and SNF options. CSW explained the process of placement at DC.  Patient and family unsure if patient would go to SNF at this time as they are unsure of her progress.  Family would discuss amongst themselves and decide. Patient desires to return home. CSW to follow up.  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 Patient and family aware of care plan and options for DC. Patient comfortable with care plan thus far.  Patient/Family's Understanding of and Emotional Response to Diagnosis, Current Treatment, and Prognosis:  Patient and family agreeable with discussion of DC and SNF options. Patient and family uncertain of the patient's physical abilities to return home. They will coordinate with medical team. Patient has hopes to improve to normalcy at the end of treatment. DC to SNF pending outcome of medical feedback.  Emotional Assessment Appearance:  Appears stated age Attitude/Demeanor/Rapport:   (Cooperative) Affect (typically observed):  Accepting, Appropriate, Calm Orientation:  Oriented to Self, Oriented to Place, Oriented to  Time, Oriented to Situation Alcohol / Substance use:  Not Applicable Psych involvement (Current and /or in the community):  No (Comment)  Discharge Needs  Concerns to be addressed:  Care Coordination Readmission within the last 30 days:  No Current discharge risk:  Physical Impairment, Dependent with Mobility Barriers to Discharge:  Continued Medical Work up   [employer], LCSW 03/22/2017, 10:11 AM

## 2017-03-22 NOTE — Evaluation (Signed)
Occupational Therapy Evaluation Patient Details Name: Mary Dalton MRN: [id number] DOB: [birth year] Today's Date: 03/22/2017    History of Present Illness Pt is a 61 yo female with complaints of L knee pain secondary to OA, s/p 03/20/17 L TKA,. PMH OA, CAD, HTN, hypothyroid, Stroke, NSTEMI   Clinical Impression   Pt is s/p TKA resulting in the deficits listed below (see OT Problem List).  Pt will benefit from skilled OT to increase their safety and independence with ADL and functional mobility for ADL to facilitate discharge to venue listed below.        Follow Up Recommendations  SNF    Equipment Recommendations  None recommended by OT       Precautions / Restrictions Precautions Precautions: Knee Precaution Booklet Issued: Yes (comment) Precaution Comments: KI Required Braces or Orthoses: Knee Immobilizer - Left Restrictions Weight Bearing Restrictions: Yes LLE Weight Bearing: Weight bearing as tolerated      Mobility Bed Mobility Overal bed mobility: Needs Assistance Bed Mobility: Supine to Sit     Supine to sit: Min assist;HOB elevated     General bed mobility comments: increased time and VC for sequencing  Transfers Overall transfer level: Needs assistance Equipment used: Rolling walker (2 wheeled) Transfers: Sit to/from [facility] Sit to Stand: Mod assist Stand pivot transfers: Mod assist Squat pivot transfers: Mod assist (to [facility])     General transfer comment: bed to [facility] and BSC to chair. VC for hand placement    Balance Overall balance assessment: Needs assistance Sitting-balance support: Bilateral upper extremity supported;Feet supported Sitting balance-Leahy Scale: Fair Sitting balance - Comments: pt required support throughout to maintain her balance seated EoB   Standing balance support: During functional activity Standing balance-Leahy Scale: Poor Standing balance comment: requires min guard to maintain static balance                           ADL either performed or assessed with clinical judgement   ADL Overall ADL's : Needs assistance/impaired Eating/Feeding: Set up;Sitting   Grooming: Set up;Sitting   Upper Body Bathing: Set up;Sitting   Lower Body Bathing: Maximal assistance;Sit to/from stand;Cueing for safety;Cueing for sequencing;Cueing for compensatory techniques   Upper Body Dressing : Set up;Sitting   Lower Body Dressing: Maximal assistance;Sit to/from stand;Cueing for safety;Cueing for sequencing;Cueing for compensatory techniques   Toilet Transfer: Moderate assistance;RW;BSC;Stand-pivot   Toileting- Clothing Manipulation and Hygiene: Moderate assistance;Sit to/from stand;Cueing for sequencing;Cueing for safety;Cueing for compensatory techniques       Functional mobility during ADLs: Moderate assistance;Cueing for safety;Rolling walker                    Pertinent Vitals/Pain Pain Assessment: 0-10 Pain Score: 1  Pain Location: L knee Pain Descriptors / Indicators: Guarding;Grimacing Pain Intervention(s): Monitored during session     Hand Dominance     Extremity/Trunk Assessment Upper Extremity Assessment Upper Extremity Assessment: Overall WFL for tasks assessed           Communication Communication Communication: No difficulties   Cognition Arousal/Alertness: Awake/alert Behavior During Therapy: WFL for tasks assessed/performed Overall Cognitive Status: Within Functional Limits for tasks assessed                                                Home Living Family/patient expects to be  discharged to:: Private residence Living Arrangements: Alone Available Help at Discharge: Family;Available 24 hours/day Type of Home: House Home Access: Stairs to enter [employer] of Steps: 2 Entrance Stairs-Rails: Right Home Layout: One level     Bathroom Shower/Tub: Occupational psychologist: Standard Bathroom Accessibility:  Yes   Home Equipment: Bedside commode;Walker - 2 wheels          Prior Functioning/Environment Level of Independence: Independent        Comments: Advertising copywriter        OT Problem List: Decreased strength;Decreased knowledge of use of DME or AE      OT Treatment/Interventions: Self-care/ADL training;DME and/or AE instruction;Patient/family education    OT Goals(Current goals can be found in the care plan section) Acute Rehab OT Goals Patient Stated Goal: go home OT Goal Formulation: With patient Time For Goal Achievement: 03/29/17 Potential to Achieve Goals: Good ADL Goals Pt Will Perform Lower Body Bathing: with modified independence;sit to/from stand Pt Will Perform Lower Body Dressing: with modified independence;sit to/from stand Pt Will Transfer to Toilet: with modified independence;ambulating;regular height toilet Pt Will Perform Toileting - Clothing Manipulation and hygiene: with modified independence;sit to/from stand;with adaptive equipment  OT Frequency: Min 2X/week   Barriers to D/C: Decreased caregiver support             End of Session  Activity Tolerance: Patient tolerated treatment well Patient left: in chair;with call bell/phone within reach                   Time: 1258-1320 OT Time Calculation (min): 22 min Charges:  OT General Charges $OT Visit: 1 Procedure OT Evaluation $OT Eval Moderate Complexity: 1 Procedure G-Codes:     Kari Baars, [place]  Payton Mccallum D 03/22/2017, 1:27 PM

## 2017-03-22 NOTE — Progress Notes (Signed)
Orthopedic Tech Progress Note Patient Details:  LLOYD CULLINAN Feb 21, 1956 [id number]  CPM Left Knee CPM Left Knee: On Left Knee Flexion (Degrees): 60 Left Knee Extension (Degrees): 0 Additional Comments: Place pt on cpm on left knee at 0-60   Aza, [place] 03/22/2017, 7:13 PM

## 2017-03-22 NOTE — NC FL2 (Signed)
[place] LEVEL OF CARE SCREENING TOOL     IDENTIFICATION  Patient Name: Mary Dalton Birthdate: 15-Jan-1956 Sex: female Admission Date (Current Location): 03/20/2017  [facility] [place] Number:  Herbalist and Address:  [facility]. [facility], [place] [address], [place], [place] [address]      Provider Number: [id number]  Attending Physician Name and Address:  Meredith Pel, MD  Relative Name and Phone Number:       Current Level of Care: Hospital Recommended Level of Care: [place] Prior Approval Number:    Date Approved/Denied: 03/22/17 PASRR Number: [id number]  Discharge Plan: SNF    Current Diagnoses: Patient Active Problem List   Diagnosis Date Noted  . Arthritis of knee 03/20/2017  . Chronic pain of right knee 01/10/2017  . Primary osteoarthritis of right knee 01/10/2017  . Pain in right foot 01/10/2017  . Primary osteoarthritis of left knee 01/10/2017  . Hyperlipidemia 11/09/2015  . Essential hypertension   . Chest pain 10/24/2015  . NSTEMI (non-ST elevated myocardial infarction) ([place]) 10/24/2015  . Diarrhea 10/24/2015  . Elevated troponin 10/24/2015  . Hypothyroidism 10/24/2015  . Stroke ([place])   . Hypertensive urgency   . GERD (gastroesophageal reflux disease)   . Gastroesophageal reflux disease without esophagitis     Orientation RESPIRATION BLADDER Height & Weight     Self, Time, Situation, Place  O2 (Nasal Cannula 2 L) Incontinent Weight: 235 lb (106.6 kg) Height:     BEHAVIORAL SYMPTOMS/MOOD NEUROLOGICAL BOWEL NUTRITION STATUS      Continent Diet (See DC Summary)  AMBULATORY STATUS COMMUNICATION OF NEEDS Skin   Limited Assist Verbally Surgical wounds (Left Knee Closed Incision with compression Wrap)                       Personal Care Assistance Level of Assistance  Bathing, Feeding, Dressing Bathing Assistance: Limited assistance Feeding assistance: Limited  assistance Dressing Assistance: Limited assistance     Functional Limitations Info  Sight, Hearing, Speech Sight Info: Adequate Hearing Info: Adequate Speech Info: Adequate    SPECIAL CARE FACTORS FREQUENCY  PT (By licensed PT), OT (By licensed OT)     PT Frequency: 5xweek OT Frequency: 5xweek            Contractures      Additional Factors Info  Code Status, Allergies Code Status Info: Full Allergies Info: NKA           Current Medications (03/22/2017):  This is the current hospital active medication list Current Facility-Administered Medications  Medication Dose Route Frequency Provider Last Rate Last Dose  . acetaminophen (TYLENOL) tablet 650 mg  650 mg Oral Q6H PRN Meredith Pel, MD       Or  . acetaminophen (TYLENOL) suppository 650 mg  650 mg Rectal Q6H PRN Meredith Pel, MD      . docusate sodium (COLACE) capsule 100 mg  100 mg Oral BID Meredith Pel, MD   100 mg at 03/22/17 1046  . feeding supplement (ENSURE ENLIVE) (ENSURE ENLIVE) liquid 237 mL  237 mL Oral Q1500 Meredith Pel, MD      . gabapentin (NEURONTIN) capsule 300 mg  300 mg Oral TID Meredith Pel, MD   300 mg at 03/22/17 1046  . hydrochlorothiazide (MICROZIDE) capsule 12.5 mg  12.5 mg Oral Daily Meredith Pel, MD   12.5 mg at 03/22/17 1046  . lactated ringers infusion  Intravenous Continuous Belinda Block, MD 50 mL/hr at 03/20/17 0820    . levothyroxine (SYNTHROID, LEVOTHROID) tablet 112 mcg  112 mcg Oral QAC breakfast Meredith Pel, MD   112 mcg at 03/22/17 0856  . menthol-cetylpyridinium (CEPACOL) lozenge 3 mg  1 lozenge Oral PRN Meredith Pel, MD       Or  . phenol (CHLORASEPTIC) mouth spray 1 spray  1 spray Mouth/Throat PRN Meredith Pel, MD      . methocarbamol (ROBAXIN) tablet 500 mg  500 mg Oral Q6H PRN Meredith Pel, MD   500 mg at 03/21/17 1302   Or  . methocarbamol (ROBAXIN) 500 mg in dextrose 5 % 50 mL IVPB  500 mg Intravenous Q6H PRN  Meredith Pel, MD      . metoCLOPramide (REGLAN) tablet 5-10 mg  5-10 mg Oral Q8H PRN Meredith Pel, MD       Or  . metoCLOPramide (REGLAN) injection 5-10 mg  5-10 mg Intravenous Q8H PRN Meredith Pel, MD      . metoprolol tartrate (LOPRESSOR) tablet 25 mg  25 mg Oral BID Meredith Pel, MD   25 mg at 03/22/17 1046  . morphine 4 MG/ML injection 4 mg  4 mg Intravenous Q4H PRN Meredith Pel, MD   4 mg at 03/20/17 1708  . ondansetron (ZOFRAN) tablet 4 mg  4 mg Oral Q6H PRN Meredith Pel, MD   4 mg at 03/21/17 1302   Or  . ondansetron (ZOFRAN) injection 4 mg  4 mg Intravenous Q6H PRN Meredith Pel, MD   4 mg at 03/20/17 1848  . oxyCODONE (Oxy IR/ROXICODONE) immediate release tablet 5-10 mg  5-10 mg Oral Q3H PRN Meredith Pel, MD   10 mg at 03/22/17 1046  . rivaroxaban (XARELTO) tablet 10 mg  10 mg Oral Q breakfast Meredith Pel, MD   10 mg at 03/22/17 4268     Discharge Medications: Please see discharge summary for a list of discharge medications.  Relevant Imaging Results:  Relevant Lab Results:   Additional Information [id number]  Normajean Baxter, LCSW

## 2017-03-22 NOTE — NC FL2 (Deleted)
[place] LEVEL OF CARE SCREENING TOOL     IDENTIFICATION  Patient Name: Mary Dalton Birthdate: 1956-11-12 Sex: female Admission Date (Current Location): 03/20/2017  [facility] [place] Number:  Herbalist and Address:  [facility] [place]. [facility], [place] [address], [place], [place] [address]      Provider Number: [id number]  Attending Physician Name and Address:  Meredith Pel, MD  Relative Name and Phone Number:       Current Level of Care: Hospital Recommended Level of Care: [place] Prior Approval Number:    Date Approved/Denied: 03/22/17 PASRR Number: [id number]  Discharge Plan: SNF    Current Diagnoses: Patient Active Problem List   Diagnosis Date Noted  . Arthritis of knee 03/20/2017  . Chronic pain of right knee 01/10/2017  . Primary osteoarthritis of right knee 01/10/2017  . Pain in right foot 01/10/2017  . Primary osteoarthritis of left knee 01/10/2017  . Hyperlipidemia 11/09/2015  . Essential hypertension   . Chest pain 10/24/2015  . NSTEMI (non-ST elevated myocardial infarction) ([place]) 10/24/2015  . Diarrhea 10/24/2015  . Elevated troponin 10/24/2015  . Hypothyroidism 10/24/2015  . Stroke ([place])   . Hypertensive urgency   . GERD (gastroesophageal reflux disease)   . Gastroesophageal reflux disease without esophagitis     Orientation RESPIRATION BLADDER Height & Weight     Self, Time, Situation  O2 (Nasal Cannula 2 L) Incontinent Weight: 235 lb (106.6 kg) Height:     BEHAVIORAL SYMPTOMS/MOOD NEUROLOGICAL BOWEL NUTRITION STATUS      Continent Diet (See DC Summary)  AMBULATORY STATUS COMMUNICATION OF NEEDS Skin   Limited Assist Verbally Surgical wounds (Left Knee Closed Incision with compression Wrap)                       Personal Care Assistance Level of Assistance  Bathing, Feeding, Dressing Bathing Assistance: Limited assistance Feeding assistance: Limited assistance Dressing  Assistance: Limited assistance     Functional Limitations Info  Sight, Speech, Hearing Sight Info: Adequate Hearing Info: Adequate Speech Info: Adequate    SPECIAL CARE FACTORS FREQUENCY  PT (By licensed PT), OT (By licensed OT)     PT Frequency: 5xweek OT Frequency: 5xweek            Contractures      Additional Factors Info  Code Status, Allergies Code Status Info: Full Allergies Info: NKA           Current Medications (03/22/2017):  This is the current hospital active medication list Current Facility-Administered Medications  Medication Dose Route Frequency Provider Last Rate Last Dose  . acetaminophen (TYLENOL) tablet 650 mg  650 mg Oral Q6H PRN Meredith Pel, MD       Or  . acetaminophen (TYLENOL) suppository 650 mg  650 mg Rectal Q6H PRN Meredith Pel, MD      . docusate sodium (COLACE) capsule 100 mg  100 mg Oral BID Meredith Pel, MD   100 mg at 03/22/17 1046  . feeding supplement (ENSURE ENLIVE) (ENSURE ENLIVE) liquid 237 mL  237 mL Oral Q1500 Meredith Pel, MD      . gabapentin (NEURONTIN) capsule 300 mg  300 mg Oral TID Meredith Pel, MD   300 mg at 03/22/17 1046  . hydrochlorothiazide (MICROZIDE) capsule 12.5 mg  12.5 mg Oral Daily Meredith Pel, MD   12.5 mg at 03/22/17 1046  . lactated ringers infusion   Intravenous  Continuous Belinda Block, MD 50 mL/hr at 03/20/17 0820    . levothyroxine (SYNTHROID, LEVOTHROID) tablet 112 mcg  112 mcg Oral QAC breakfast Meredith Pel, MD   112 mcg at 03/22/17 0856  . menthol-cetylpyridinium (CEPACOL) lozenge 3 mg  1 lozenge Oral PRN Meredith Pel, MD       Or  . phenol (CHLORASEPTIC) mouth spray 1 spray  1 spray Mouth/Throat PRN Meredith Pel, MD      . methocarbamol (ROBAXIN) tablet 500 mg  500 mg Oral Q6H PRN Meredith Pel, MD   500 mg at 03/21/17 1302   Or  . methocarbamol (ROBAXIN) 500 mg in dextrose 5 % 50 mL IVPB  500 mg Intravenous Q6H PRN Meredith Pel, MD       . metoCLOPramide (REGLAN) tablet 5-10 mg  5-10 mg Oral Q8H PRN Meredith Pel, MD       Or  . metoCLOPramide (REGLAN) injection 5-10 mg  5-10 mg Intravenous Q8H PRN Meredith Pel, MD      . metoprolol tartrate (LOPRESSOR) tablet 25 mg  25 mg Oral BID Meredith Pel, MD   25 mg at 03/22/17 1046  . morphine 4 MG/ML injection 4 mg  4 mg Intravenous Q4H PRN Meredith Pel, MD   4 mg at 03/20/17 1708  . ondansetron (ZOFRAN) tablet 4 mg  4 mg Oral Q6H PRN Meredith Pel, MD   4 mg at 03/21/17 1302   Or  . ondansetron (ZOFRAN) injection 4 mg  4 mg Intravenous Q6H PRN Meredith Pel, MD   4 mg at 03/20/17 1848  . oxyCODONE (Oxy IR/ROXICODONE) immediate release tablet 5-10 mg  5-10 mg Oral Q3H PRN Meredith Pel, MD   10 mg at 03/22/17 1046  . rivaroxaban (XARELTO) tablet 10 mg  10 mg Oral Q breakfast Meredith Pel, MD   10 mg at 03/22/17 9326     Discharge Medications: Please see discharge summary for a list of discharge medications.  Relevant Imaging Results:  Relevant Lab Results:   Additional Information [id number]  Normajean Baxter, LCSW

## 2017-03-22 NOTE — Progress Notes (Signed)
Subjective: Pt stable - pain ok - doing better with pt today   Objective: Vital signs in last 24 hours: Temp:  [98.2 F (36.8 C)-99.6 F (37.6 C)] 99.6 F (37.6 C) (04/19 0600) Pulse Rate:  [63-66] 63 (04/19 0600) Resp:  [17] 17 (04/18 1951) BP: (130-139)/(51-61) 130/51 (04/19 0600) SpO2:  [93 %-96 %] 96 % (04/19 0600)  Intake/Output from previous day: 04/18 0701 - 04/19 0700 In: 480 [P.O.:480] Out: 300 [Urine:300] Intake/Output this shift: Total I/O In: 240 [P.O.:240] Out: -   Exam:  No cellulitis present  Labs: No results for input(s): HGB in the last 72 hours. No results for input(s): WBC, RBC, HCT, PLT in the last 72 hours. No results for input(s): NA, K, CL, CO2, BUN, CREATININE, GLUCOSE, CALCIUM in the last 72 hours. No results for input(s): LABPT, INR in the last 72 hours.  Assessment/Plan: Plan pt and cpm today - dc to snf friday   [employer] 03/22/2017, 11:47 AM

## 2017-03-22 NOTE — Progress Notes (Signed)
Physical Therapy Treatment Patient Details Name: Mary Dalton MRN: [id number] DOB: 04-25-1956 Today's Date: 03/22/2017    History of Present Illness Pt is a 61 yo female with complaints of L knee pain secondary to OA, s/p 03/20/17 L TKA,. PMH OA, CAD, HTN, hypothyroid, Stroke, NSTEMI    PT Comments    Pt is making fair progress towards her goals and was not limited by pain during this session. Pt requested not getting out of recliner because she had just used the [facility] and was tired. Pt agreed to therex. Pt knee ROM is currently 4 to 76 degrees. Pt requires skilled PT to continued to progress transfers and ambulation as well as LE strengthening and endurance to safely navigate her discharge environment.  Follow Up Recommendations  SNF;Supervision/Assistance - 24 hour     Equipment Recommendations  Rolling walker with 5" wheels;3in1 (PT)    Recommendations for Other Services OT consult     Precautions / Restrictions Precautions Precautions: Knee Precaution Booklet Issued: Yes (comment) Precaution Comments: KI Required Braces or Orthoses: Knee Immobilizer - Left Restrictions Weight Bearing Restrictions: Yes LLE Weight Bearing: Weight bearing as tolerated       Balance Overall balance assessment: Needs assistance Sitting-balance support: Feet supported Sitting balance-Leahy Scale: Good Sitting balance - Comments: pt reached for her phone outside her BoS with no LoB                                    Cognition Arousal/Alertness: Awake/alert Behavior During Therapy: WFL for tasks assessed/performed Overall Cognitive Status: Within Functional Limits for tasks assessed                                        Exercises Total Joint Exercises Ankle Circles/Pumps: AROM;Both;10 reps;Seated Quad Sets: AROM;Left;10 reps;Seated Heel Slides: AROM;Left;10 reps;Seated (in recliner) Long Arc Quad: AROM;Left;15 reps;Seated Knee Flexion: AROM;Left;10  reps;Seated Goniometric ROM: 4 to 76 degrees measured in recliner        Pertinent Vitals/Pain Pain Assessment: 0-10 Pain Score: 1  Pain Location: L knee Pain Descriptors / Indicators: Sore Pain Intervention(s): Monitored during session  VSS    Home Living Family/patient expects to be discharged to:: Private residence Living Arrangements: Alone Available Help at Discharge: Family;Available 24 hours/day Type of Home: House Home Access: Stairs to enter Entrance Stairs-Rails: Right Home Layout: One level Home Equipment: Bedside commode;Walker - 2 wheels      Prior Function Level of Independence: Independent      Comments: community ambulator and driver   PT Goals (current goals can now be found in the care plan section) Acute Rehab PT Goals Patient Stated Goal: go home PT Goal Formulation: With patient Time For Goal Achievement: 03/28/17 Potential to Achieve Goals: Fair Progress towards PT goals: Progressing toward goals    Frequency    7X/week      PT Plan Current plan remains appropriate       End of Session   Activity Tolerance: Patient tolerated treatment well Patient left: in chair;with call bell/phone within reach;with family/visitor present Nurse Communication: Mobility status PT Visit Diagnosis: Unsteadiness on feet (R26.81);Other abnormalities of gait and mobility (R26.89);Dizziness and giddiness (R42);Pain Pain - Right/Left: Left Pain - part of body: Knee     Time: 7062-3762 PT Time Calculation (min) (ACUTE ONLY): 15 min  Charges:  $  Therapeutic Exercise: 8-22 mins                    G Codes:       Kellin Fifer B. Migdalia Dk PT, DPT Acute Rehabilitation  [phone] Pager [phone]     [place] 03/22/2017, 3:59 PM

## 2017-03-22 NOTE — Social Work (Signed)
Pt has selected [facility] for SNF placement. Pt has [employer] and [employer] from [facility] is aware and is working on [employer].  Elissa Hefty, LCSW Clinical Social Worker [phone]

## 2017-03-22 NOTE — Care Management (Signed)
Case mange spoke with therapist concerning patient's progress, at this time patient will need shortterm rehab at [facility] per therapy. CM contacted Social worker with this update.

## 2017-03-22 NOTE — Progress Notes (Signed)
Physical Therapy Treatment Patient Details Name: Mary Dalton MRN: [id number] DOB: July 25, 1956 Today's Date: 03/22/2017    History of Present Illness Pt is a 61 yo female with complaints of L knee pain secondary to OA, s/p 03/20/17 L TKA,. PMH OA, CAD, HTN, hypothyroid, Stroke, NSTEMI    PT Comments    Pt is making fair progress towards her goals. PT discussed possibility of d/c to SNF as her mobility is not adequate to safely get around at home. Pt is agreeable and case manager notified. Pt is minA with bed mobility, and modA for transfer to [facility] and RW. Pt modA for ambulation of 10 feet with RW. Pt requires continued skilled PT to progress transfers and mobility and to increase LE strength and endurance to safely navigate her d/c environment.    Follow Up Recommendations  SNF;Supervision/Assistance - 24 hour     Equipment Recommendations  Rolling walker with 5" wheels;3in1 (PT)    Recommendations for Other Services OT consult     Precautions / Restrictions Precautions Precautions: Knee Precaution Booklet Issued: Yes (comment) Precaution Comments: KI Required Braces or Orthoses: Knee Immobilizer - Left Restrictions Weight Bearing Restrictions: Yes LLE Weight Bearing: Weight bearing as tolerated    Mobility  Bed Mobility Overal bed mobility: Needs Assistance Bed Mobility: Supine to Sit     Supine to sit: Min assist;HOB elevated     General bed mobility comments: minAx1 for management of LE to floor and pad scoot of hips to EoB  Transfers Overall transfer level: Needs assistance Equipment used: Rolling walker (2 wheeled) Transfers: Sit to/from Stand Sit to Stand: Mod assist (to RW )   Squat pivot transfers: Mod assist (to [facility])     General transfer comment: modA for transfers requires vc for proper hand placement and movement of L LE especially with squat pivot. Pt with tendency to get L LE caught under her with movement as she has difficulty moving  it  Ambulation/Gait Ambulation/Gait assistance: Mod assist Ambulation Distance (Feet): 10 Feet Assistive device: Rolling walker (2 wheeled) Gait Pattern/deviations: Step-to pattern;Decreased step length - right;Decreased stance time - left;Trunk flexed;Shuffle;Antalgic Gait velocity: slowed Gait velocity interpretation: Below normal speed for age/gender General Gait Details: pt very antalgic becomes very fatigued with a few steps and starts leaning over RW for support       Balance Overall balance assessment: Needs assistance Sitting-balance support: Bilateral upper extremity supported;Feet supported Sitting balance-Leahy Scale: Fair Sitting balance - Comments: pt required support throughout to maintain her balance seated EoB   Standing balance support: During functional activity Standing balance-Leahy Scale: Poor Standing balance comment: requires min guard to maintain static balance                            Cognition Arousal/Alertness: Awake/alert Behavior During Therapy: WFL for tasks assessed/performed Overall Cognitive Status: Within Functional Limits for tasks assessed                                               Pertinent Vitals/Pain Pain Assessment: 0-10 Pain Score: 7  Pain Location: L knee Pain Descriptors / Indicators: Guarding;Grimacing Pain Intervention(s): Limited activity within patient's tolerance;Monitored during session  VSS           PT Goals (current goals can now be found in the care plan section) Acute  Rehab PT Goals Patient Stated Goal: go home PT Goal Formulation: With patient Time For Goal Achievement: 03/28/17 Potential to Achieve Goals: Fair Progress towards PT goals: Progressing toward goals    Frequency    7X/week      PT Plan Current plan remains appropriate       End of Session Equipment Utilized During Treatment: Gait belt;Left knee immobilizer Activity Tolerance: Patient limited by  fatigue;Patient limited by pain Patient left: in chair;with call bell/phone within reach;with family/visitor present Nurse Communication: Mobility status PT Visit Diagnosis: Unsteadiness on feet (R26.81);Other abnormalities of gait and mobility (R26.89);Dizziness and giddiness (R42);Pain Pain - Right/Left: Left Pain - part of body: Knee     Time: 8138-8719 PT Time Calculation (min) (ACUTE ONLY): 37 min  Charges:  $Gait Training: 8-22 mins $Therapeutic Activity: 8-22 mins                    G Codes:       Mary Dalton B. Migdalia Dk PT, DPT Acute Rehabilitation  [phone] Pager [phone]     [place] 03/22/2017, 11:53 AM

## 2017-03-23 DIAGNOSIS — I10 Essential (primary) hypertension: Secondary | ICD-10-CM | POA: Diagnosis not present

## 2017-03-23 DIAGNOSIS — M6281 Muscle weakness (generalized): Secondary | ICD-10-CM | POA: Diagnosis not present

## 2017-03-23 DIAGNOSIS — Z96652 Presence of left artificial knee joint: Secondary | ICD-10-CM | POA: Diagnosis not present

## 2017-03-23 DIAGNOSIS — S8990XA Unspecified injury of unspecified lower leg, initial encounter: Secondary | ICD-10-CM | POA: Diagnosis not present

## 2017-03-23 DIAGNOSIS — I252 Old myocardial infarction: Secondary | ICD-10-CM | POA: Diagnosis not present

## 2017-03-23 DIAGNOSIS — I2510 Atherosclerotic heart disease of native coronary artery without angina pectoris: Secondary | ICD-10-CM | POA: Diagnosis not present

## 2017-03-23 DIAGNOSIS — E039 Hypothyroidism, unspecified: Secondary | ICD-10-CM | POA: Diagnosis not present

## 2017-03-23 DIAGNOSIS — Z471 Aftercare following joint replacement surgery: Secondary | ICD-10-CM | POA: Diagnosis not present

## 2017-03-23 DIAGNOSIS — R1314 Dysphagia, pharyngoesophageal phase: Secondary | ICD-10-CM | POA: Diagnosis not present

## 2017-03-23 DIAGNOSIS — M1712 Unilateral primary osteoarthritis, left knee: Secondary | ICD-10-CM | POA: Diagnosis not present

## 2017-03-23 DIAGNOSIS — E034 Atrophy of thyroid (acquired): Secondary | ICD-10-CM | POA: Diagnosis not present

## 2017-03-23 DIAGNOSIS — I160 Hypertensive urgency: Secondary | ICD-10-CM | POA: Diagnosis not present

## 2017-03-23 DIAGNOSIS — R2689 Other abnormalities of gait and mobility: Secondary | ICD-10-CM | POA: Diagnosis not present

## 2017-03-23 DIAGNOSIS — S79919A Unspecified injury of unspecified hip, initial encounter: Secondary | ICD-10-CM | POA: Diagnosis not present

## 2017-03-23 MED ORDER — METHOCARBAMOL 500 MG PO TABS: 500.0000 mg | ORAL_TABLET | Freq: Four times a day (QID) | ORAL | 0 refills | Status: DC | PRN

## 2017-03-23 MED ORDER — RIVAROXABAN 10 MG PO TABS: 10.0000 mg | ORAL_TABLET | Freq: Every day | ORAL | 0 refills | Status: DC

## 2017-03-23 MED ORDER — OXYCODONE HCL 5 MG PO TABS: 5.0000 mg | ORAL_TABLET | ORAL | 0 refills | Status: DC | PRN

## 2017-03-23 NOTE — Clinical Social Work Placement (Signed)
   CLINICAL SOCIAL WORK PLACEMENT  NOTE  Date:  03/23/2017  Patient Details  Name: Mary Dalton MRN: [id number] Date of Birth: 1956-03-04  Clinical Social Work is seeking post-discharge placement for this patient at the [place] level of care (*CSW will initial, date and re-position this form in  chart as items are completed):  Yes   Patient/family provided with [place] Work Department's list of facilities offering this level of care within the geographic area requested by the patient (or if unable, by the patient's family).  Yes   Patient/family informed of their freedom to choose among providers that offer the needed level of care, that participate in Medicare, Medicaid or managed care program needed by the patient, have an available bed and are willing to accept the patient.      Patient/family informed of [facility]'s ownership interest in [facility] and [facility], as well as of the fact that they are under no obligation to receive care at these facilities.  PASRR submitted to EDS on       PASRR number received on 03/22/17     Existing PASRR number confirmed on       FL2 transmitted to all facilities in geographic area requested by pt/family on 03/22/17     FL2 transmitted to all facilities within larger geographic area on 03/22/17     Patient informed that his/her managed care company has contracts with or will negotiate with certain facilities, including the following:        Yes   Patient/family informed of bed offers received.  Patient chooses bed at [facility] and Rehab     Physician recommends and patient chooses bed at      Patient to be transferred to [facility] and Rehab on 03/23/17.  Patient to be transferred to facility by [facility]     Patient family notified on 03/23/17 of transfer.  Name of family member notified:  daughter      PHYSICIAN Please prepare priority discharge summary, including  medications, Please prepare prescriptions, Please sign FL2     Additional Comment:    _______________________________________________ Normajean Baxter, LCSW 03/23/2017, 12:36 PM

## 2017-03-23 NOTE — Progress Notes (Signed)
Called report to nurse Sherice at [facility]. Reviewed HPI, PMH, recent labs and vitals, and conveyed that patient had not had a bowel movement since 4/16 despite BID colace and prune juice this AM. Discharge instructions printed and reviewed with patient and family, and copy given for them to take home. All questions addressed at this time. New prescriptions were sent to [facility] with [facility] papers. IV removed, and ensured that surgical dressing was clean/dry/intact. Room searched for patient belongings, and confirmed with patient that all valuables were accounted for. Family assisted patient to dress, then [facility] transported patient to SNF for rehab via stretcher.

## 2017-03-23 NOTE — Social Work (Signed)
Clinical Social Worker facilitated patient discharge including contacting patient family and facility to confirm patient discharge plans.  Clinical information faxed to facility and family agreeable with plan.  CSW arranged ambulance transport via [facility] to [facility].  RN to call [phone] report prior to discharge.  Clinical Social Worker will sign off for now as social work intervention is no longer needed. Please consult us again if new need arises.  Latoiya Maradiaga, LCSW Clinical Social Worker [phone]    

## 2017-03-23 NOTE — Discharge Summary (Signed)
Physician Discharge Summary  Patient ID: Mary Dalton MRN: [id number] DOB/AGE: 63-Apr-1957 63 y.o.  Admit date: 03/20/2017 Discharge date: 03/23/2017  Admission Diagnoses:  Active Problems:   Arthritis of knee   Discharge Diagnoses:  Same  Surgeries: Procedure(s): TOTAL KNEE ARTHROPLASTY on 03/20/2017   Consultants:   Discharged Condition: Stable  Hospital Course: Mary Dalton is an 61 y.o. female who was admitted 03/20/2017 with a chief complaint of l, and found to have a diagnosis of left knee arthritis.  They were brought to the operating room on 03/20/2017 and underwent the above named procedures.  Did well and mobilized well with PT but will need snf stay to increase Strength and stamina.  Patient on Xarelto for DVT prophylaxis.  She will be weightbearing as tolerated and will need to undergo range of motion and strengthening  Antibiotics given:  Anti-infectives    Start     Dose/Rate Route Frequency Ordered Stop   03/20/17 1800  ceFAZolin (ANCEF) IVPB 2g/100 mL premix     2 g 200 mL/hr over 30 Minutes Intravenous Every 6 hours 03/20/17 1545 03/20/17 2332   03/20/17 0745  ceFAZolin (ANCEF) IVPB 2g/100 mL premix     2 g 200 mL/hr over 30 Minutes Intravenous On call to O.R. 03/20/17 0745 03/20/17 1300    .  Recent vital signs:  Vitals:   03/23/17 0300 03/23/17 0947  BP: (!) 123/55 129/75  Pulse: 65 62  Resp: 18   Temp: 98.2 F (36.8 C)     Recent laboratory studies:  Results for orders placed or performed during the hospital encounter of 03/12/17  Urine culture  Result Value Ref Range   Specimen Description URINE, CLEAN CATCH    Special Requests NONE    Culture <10,000 COLONIES/mL INSIGNIFICANT GROWTH (A)    Report Status 03/13/2017 FINAL   Surgical pcr screen  Result Value Ref Range   MRSA, PCR NEGATIVE NEGATIVE   Staphylococcus aureus NEGATIVE NEGATIVE  Basic metabolic panel  Result Value Ref Range   Sodium 139 135 - 145 mmol/L   Potassium 3.8 3.5  - 5.1 mmol/L   Chloride 108 101 - 111 mmol/L   CO2 25 22 - 32 mmol/L   Glucose, Bld 115 (H) 65 - 99 mg/dL   BUN 11 6 - 20 mg/dL   Creatinine, Ser 0.69 0.44 - 1.00 mg/dL   Calcium 9.0 8.9 - 10.3 mg/dL   GFR calc non Af Amer >60 >60 mL/min   GFR calc Af Amer >60 >60 mL/min   Anion gap 6 5 - 15  CBC  Result Value Ref Range   WBC 6.5 4.0 - 10.5 K/uL   RBC 4.19 3.87 - 5.11 MIL/uL   Hemoglobin 13.0 12.0 - 15.0 g/dL   HCT 39.1 36.0 - 46.0 %   MCV 93.3 78.0 - 100.0 fL   MCH 31.0 26.0 - 34.0 pg   MCHC 33.2 30.0 - 36.0 g/dL   RDW 13.9 11.5 - 15.5 %   Platelets 324 150 - 400 K/uL  Urinalysis, Routine w reflex microscopic  Result Value Ref Range   Color, Urine YELLOW YELLOW   APPearance CLEAR CLEAR   Specific Gravity, Urine 1.017 1.005 - 1.030   pH 5.0 5.0 - 8.0   Glucose, UA NEGATIVE NEGATIVE mg/dL   Hgb urine dipstick MODERATE (A) NEGATIVE   Bilirubin Urine NEGATIVE NEGATIVE   Ketones, ur NEGATIVE NEGATIVE mg/dL   Protein, ur NEGATIVE NEGATIVE mg/dL   Nitrite NEGATIVE NEGATIVE  Leukocytes, UA NEGATIVE NEGATIVE   RBC / HPF 0-5 0 - 5 RBC/hpf   WBC, UA 0-5 0 - 5 WBC/hpf   Bacteria, UA NONE SEEN NONE SEEN   Squamous Epithelial / LPF 0-5 (A) NONE SEEN   Mucous PRESENT     Discharge Medications:   Allergies as of 03/23/2017      Reactions   No Known Allergies       Medication List    STOP taking these medications   aspirin 81 MG EC tablet     TAKE these medications   hydrochlorothiazide 12.5 MG capsule Commonly known as:  MICROZIDE Take 1 capsule (12.5 mg total) by mouth daily.   ICY HOT EX Apply 1 application topically at bedtime as needed (pain).   levothyroxine 112 MCG tablet Commonly known as:  SYNTHROID, LEVOTHROID Take 112 mcg by mouth daily before breakfast.   methocarbamol 500 MG tablet Commonly known as:  ROBAXIN Take 1 tablet (500 mg total) by mouth every 6 (six) hours as needed for muscle spasms.   metoprolol tartrate 25 MG tablet Commonly known as:   LOPRESSOR Take 1 tablet (25 mg total) by mouth 2 (two) times daily.   oxyCODONE 5 MG immediate release tablet Commonly known as:  Oxy IR/ROXICODONE Take 1-2 tablets (5-10 mg total) by mouth every 3 (three) hours as needed for breakthrough pain.   rivaroxaban 10 MG Tabs tablet Commonly known as:  XARELTO Take 1 tablet (10 mg total) by mouth daily with breakfast. Start taking on:  03/24/2017       Diagnostic Studies: Dg Chest 2 View  Result Date: 03/12/2017 CLINICAL DATA:  Preop total knee replacement.  Hypertension. EXAM: CHEST  2 VIEW COMPARISON:  10/24/2015 FINDINGS: Heart and mediastinal contours are within normal limits. No focal opacities or effusions. No acute bony abnormality. IMPRESSION: No active cardiopulmonary disease. Electronically Signed   By: Rolm Baptise M.D.   On: 03/12/2017 11:45    Disposition: 01-Home or Self Care  Discharge Instructions    Call MD / Call 911    Complete by:  As directed    If you experience chest pain or shortness of breath, CALL 911 and be transported to the hospital emergency room.  If you develope a fever above 101 F, pus (white drainage) or increased drainage or redness at the wound, or calf pain, call your surgeon's office.   Constipation Prevention    Complete by:  As directed    Drink plenty of fluids.  Prune juice may be helpful.  You may use a stool softener, such as Colace (over the counter) 100 mg twice a day.  Use MiraLax (over the counter) for constipation as needed.   Diet - low sodium heart healthy    Complete by:  As directed    Discharge instructions    Complete by:  As directed    Weight bearing as tolerated Remove dressing next Wednesday rom and stregthening as tolerated   Increase activity slowly as tolerated    Complete by:  As directed       Contact information for after-discharge care    Destination    [facility] .   Specialty:  Skilled Nursing Facility Contact information: [address] [place] [phone]               Signed: Anderson Malta 03/23/2017, 12:03 PM

## 2017-03-23 NOTE — Progress Notes (Signed)
Subjective: Pt stable - pain ok   Objective: Vital signs in last 24 hours: Temp:  [98.2 F (36.8 C)-100.7 F (38.2 C)] 98.2 F (36.8 C) (04/20 0300) Pulse Rate:  [62-137] 62 (04/20 0947) Resp:  [18] 18 (04/20 0300) BP: (104-129)/(53-82) 129/75 (04/20 0947) SpO2:  [92 %-97 %] 97 % (04/20 0300)  Intake/Output from previous day: 04/19 0701 - 04/20 0700 In: 720 [P.O.:720] Out: -  Intake/Output this shift: Total I/O In: 240 [P.O.:240] Out: -   Exam:  Intact pulses distally  Labs: No results for input(s): HGB in the last 72 hours. No results for input(s): WBC, RBC, HCT, PLT in the last 72 hours. No results for input(s): NA, K, CL, CO2, BUN, CREATININE, GLUCOSE, CALCIUM in the last 72 hours. No results for input(s): LABPT, INR in the last 72 hours.  Assessment/Plan: Plan dc to snf today   G Alphonzo Severance 03/23/2017, 11:59 AM

## 2017-03-23 NOTE — Progress Notes (Signed)
Physical Therapy Treatment Patient Details Name: Mary Dalton MRN: [id number] DOB: 21-Jul-1956 Today's Date: 03/23/2017    History of Present Illness Pt is a 61 yo female with complaints of L knee pain secondary to OA, s/p 03/20/17 L TKA,. PMH OA, CAD, HTN, hypothyroid, Stroke, NSTEMI    PT Comments    Patient is making progressing toward mobility goals. Continues to need assistance for transfers and for safe ambulation.  Continue to progress as tolerated with anticipated d/c to SNF for further skilled PT services.    Follow Up Recommendations  SNF;Supervision/Assistance - 24 hour     Equipment Recommendations  Rolling walker with 5" wheels;3in1 (PT)    Recommendations for Other Services OT consult     Precautions / Restrictions Precautions Precautions: Knee Precaution Comments: precautions/positioning reviewed Required Braces or Orthoses: Knee Immobilizer - Left Restrictions Weight Bearing Restrictions: Yes LLE Weight Bearing: Weight bearing as tolerated    Mobility  Bed Mobility               General bed mobility comments: pt in bathroom with NT upon arrival  Transfers Overall transfer level: Needs assistance Equipment used: Rolling walker (2 wheeled) Transfers: Sit to/from Stand Sit to Stand: Mod assist         General transfer comment: cues for safe hand placement and technique from [facility]  Ambulation/Gait Ambulation/Gait assistance: Min assist Ambulation Distance (Feet): 65 Feet Assistive device: Rolling walker (2 wheeled) Gait Pattern/deviations: Step-to pattern;Step-through pattern;Antalgic;Trunk flexed;Decreased step length - right;Decreased stance time - left;Decreased dorsiflexion - left;Decreased weight shift to left Gait velocity: slowed   General Gait Details: cues for posture and safe use of AD; pt with improving step through pattern    Stairs            Wheelchair Mobility    Modified Rankin (Stroke Patients Only)        Balance Overall balance assessment: Needs assistance Sitting-balance support: Feet supported Sitting balance-Leahy Scale: Good                                      Cognition Arousal/Alertness: Awake/alert Behavior During Therapy: WFL for tasks assessed/performed Overall Cognitive Status: Within Functional Limits for tasks assessed                                        Exercises      General Comments General comments (skin integrity, edema, etc.): reviewed HEP handout with pt      Pertinent Vitals/Pain Pain Assessment: Faces Faces Pain Scale: Hurts little more Pain Location: L knee Pain Descriptors / Indicators: Sore Pain Intervention(s): Monitored during session;Repositioned;Limited activity within patient's tolerance    Home Living                      Prior Function            PT Goals (current goals can now be found in the care plan section) Acute Rehab PT Goals Patient Stated Goal: go home PT Goal Formulation: With patient Time For Goal Achievement: 03/28/17 Potential to Achieve Goals: Fair Progress towards PT goals: Progressing toward goals    Frequency    7X/week      PT Plan Current plan remains appropriate    Co-evaluation  End of Session Equipment Utilized During Treatment: Gait belt;Left knee immobilizer Activity Tolerance: Patient tolerated treatment well Patient left: in chair;with call bell/phone within reach;with family/visitor present Nurse Communication: Mobility status PT Visit Diagnosis: Unsteadiness on feet (R26.81);Other abnormalities of gait and mobility (R26.89);Dizziness and giddiness (R42);Pain Pain - Right/Left: Left Pain - part of body: Knee     Time: 2924-4628 PT Time Calculation (min) (ACUTE ONLY): 25 min  Charges:  $Gait Training: 8-22 mins $Therapeutic Activity: 8-22 mins                    G Codes:       Earney Navy, PTA Pager: [phone]      Darliss Cheney 03/23/2017, 1:40 PM

## 2017-03-23 NOTE — Plan of Care (Signed)
Problem: Safety: Goal: Ability to remain free from injury will improve Outcome: Progressing Patient demonstrated methods to reduce risk of injury during this admission.  Patient calls for assistance when attempting to ambulate and correctly uses walker when ambulating.  Pt progressing toward goal.

## 2017-03-26 ENCOUNTER — Non-Acute Institutional Stay (SKILLED_NURSING_FACILITY): Payer: BLUE CROSS/BLUE SHIELD | Admitting: Internal Medicine

## 2017-03-26 ENCOUNTER — Telehealth (INDEPENDENT_AMBULATORY_CARE_PROVIDER_SITE_OTHER): Payer: Self-pay | Admitting: Orthopedic Surgery

## 2017-03-26 DIAGNOSIS — Z96652 Presence of left artificial knee joint: Secondary | ICD-10-CM | POA: Diagnosis not present

## 2017-03-26 DIAGNOSIS — I252 Old myocardial infarction: Secondary | ICD-10-CM | POA: Diagnosis not present

## 2017-03-26 DIAGNOSIS — I10 Essential (primary) hypertension: Secondary | ICD-10-CM

## 2017-03-26 DIAGNOSIS — M1712 Unilateral primary osteoarthritis, left knee: Secondary | ICD-10-CM

## 2017-03-26 DIAGNOSIS — E034 Atrophy of thyroid (acquired): Secondary | ICD-10-CM

## 2017-03-26 NOTE — Telephone Encounter (Signed)
Called and advised per Dr Marlou Sa patient to use CPM as much as can be tolerated 1-1.5hrs 3x/day They requested order to be faxed stating such to [phone]. This was done

## 2017-03-26 NOTE — Telephone Encounter (Signed)
Caren Griffins @ [facility] requesting orders & instructions for the CPM machine patient was admitted with to the facility.

## 2017-03-27 ENCOUNTER — Encounter: Payer: Self-pay | Admitting: Internal Medicine

## 2017-03-27 DIAGNOSIS — Z96659 Presence of unspecified artificial knee joint: Secondary | ICD-10-CM | POA: Insufficient documentation

## 2017-03-27 HISTORY — DX: Presence of unspecified artificial knee joint: Z96.659

## 2017-03-27 NOTE — Progress Notes (Signed)
: Provider:  Noah Delaine. Sheppard Coil, MD Location:  [place] Room Number: 867E Place of Service:  SNF ([phone])  PCP: Velna Hatchet, MD Patient Care Team: Velna Hatchet, MD as PCP - General (Internal Medicine)  Extended Emergency Contact Information Primary Emergency Contact: Raechel Chute [address] [address] of [place] Phone: [phone] Relation: Son Secondary Emergency Contact: Fuller [place] of [place] Mobile Phone: [phone] Relation: Son     Allergies: No known allergies  Chief Complaint  Patient presents with  . New Admit To SNF    Admit to Facility    HPI: Patient is 61 y.o. female with HTN, HLD , hypothyroidism GERD, s/ who was admitted to [facility] from 4/17-20 for a planned L knee arthroplasty for endstage DJD. Pt will be prophylaxed with xarelto.There were no complications . Pt is admitted to SNF for OT/PT. While at SNF pt will be followed for HTN, tx with HCTZ and metoprolol, hypothyroidism, tx with synthroid and s/p NSTEMI, tx with metoprolol and ASA, on hold while on xarelto.  Past Medical History:  Diagnosis Date  . Arthritis    "knees" (03/20/2017)  . CAD (coronary artery disease), native coronary artery 10/2015   diffuse disease, but non obstructive  . GERD (gastroesophageal reflux disease)   . Gout   . Hypertension   . Hypothyroidism   . Metabolic syndrome   . Stroke [facility])    "dx'd in 10/2015 when I went in for my cardiac cath" (03/20/2017)    Past Surgical History:  Procedure Laterality Date  . CARDIAC CATHETERIZATION N/A 10/26/2015   Procedure: Left Heart Cath and Coronary Angiography;  Surgeon: Jettie Booze, MD;  Location: [place] CV LAB;  Service: Cardiovascular;  Laterality: N/A;  . JOINT REPLACEMENT    . LAPAROSCOPIC CHOLECYSTECTOMY    . SHOULDER ARTHROSCOPY WITH OPEN ROTATOR CUFF REPAIR Bilateral   . TOTAL KNEE ARTHROPLASTY Left 03/20/2017  . TOTAL KNEE  ARTHROPLASTY Left 03/20/2017   Procedure: TOTAL KNEE ARTHROPLASTY;  Surgeon: Meredith Pel, MD;  Location: [place];  Service: Orthopedics;  Laterality: Left;  . TUBAL LIGATION      Allergies as of 03/26/2017      Reactions   No Known Allergies       Medication List       Accurate as of 03/26/17 11:59 PM. Always use your most recent med list.          hydrochlorothiazide 12.5 MG capsule Commonly known as:  MICROZIDE Take 1 capsule (12.5 mg total) by mouth daily.   ICY HOT EX Apply 1 application topically at bedtime as needed (pain).   levothyroxine 112 MCG tablet Commonly known as:  SYNTHROID, LEVOTHROID Take 112 mcg by mouth daily before breakfast.   methocarbamol 500 MG tablet Commonly known as:  ROBAXIN Take 1 tablet (500 mg total) by mouth every 6 (six) hours as needed for muscle spasms.   metoprolol tartrate 25 MG tablet Commonly known as:  LOPRESSOR Take 1 tablet (25 mg total) by mouth 2 (two) times daily.   oxycodone 5 MG capsule Commonly known as:  OXY-IR Take 5-10 mg by mouth every 4 (four) hours as needed. 1 tablet as needed for mild pain, 2 tablets as needed for moderate to severe pain   rivaroxaban 10 MG Tabs tablet Commonly known as:  XARELTO Take 1 tablet (10 mg total) by mouth daily with breakfast.  Meds ordered this encounter  Medications  . oxycodone (OXY-IR) 5 MG capsule    Sig: Take 5-10 mg by mouth every 4 (four) hours as needed. 1 tablet as needed for mild pain, 2 tablets as needed for moderate to severe pain    Immunization History  Administered Date(s) Administered  . PPD Test 03/23/2017    Social History  Substance Use Topics  . Smoking status: Never Smoker  . Smokeless tobacco: Never Used  . Alcohol use No    Family history is   Family History  Problem Relation Age of Onset  . Healthy Mother   . Diabetes Mother   . Healthy Father   . Healthy Brother   . Healthy Brother   . Healthy Brother       Review of  Systems  DATA OBTAINED: from patient GENERAL:  no fevers, fatigue, appetite changes SKIN: No itching, or rash EYES: No eye pain, redness, discharge EARS: No earache, tinnitus, change in hearing NOSE: No congestion, drainage or bleeding  MOUTH/THROAT: No mouth or tooth pain, No sore throat RESPIRATORY: No cough, wheezing, SOB CARDIAC: No chest pain, palpitations, lower extremity edema  GI: No abdominal pain, No N/V/D or constipation, No heartburn or reflux  GU: No dysuria, frequency or urgency, or incontinence  MUSCULOSKELETAL: No unrelieved bone/joint pain NEUROLOGIC: No headache, dizziness or focal weakness PSYCHIATRIC: No c/o anxiety or sadness   Vitals:   03/26/17 0906  BP: 117/68  Pulse: 69  Resp: 20  Temp: 97.2 F (36.2 C)    SpO2 Readings from Last 1 Encounters:  03/26/17 97%   Body mass index is 39.51 kg/m.     Physical Exam  GENERAL APPEARANCE: Alert, conversant,  No acute distress.  SKIN: No diaphoresis rash; mild heat ti incision area c/w surgery HEAD: Normocephalic, atraumatic  EYES: Conjunctiva/lids clear. Pupils round, reactive. EOMs intact.  EARS: External exam WNL, canals clear. Hearing grossly normal.  NOSE: No deformity or discharge.  MOUTH/THROAT: Lips w/o lesions  RESPIRATORY: Breathing is even, unlabored. Lung sounds are clear   CARDIOVASCULAR: Heart RRR no murmurs, rubs or gallops. Trace LLE peripheral edema c/w surgery   GASTROINTESTINAL: Abdomen is soft, non-tender, not distended w/ normal bowel sounds. GENITOURINARY: Bladder non tender, not distended  MUSCULOSKELETAL: No abnormal joints or musculature NEUROLOGIC:  Cranial nerves 2-12 grossly intact. Moves all extremities  PSYCHIATRIC: Mood and affect appropriate to situation, no behavioral issues  Patient Active Problem List   Diagnosis Date Noted  . Arthritis of knee 03/20/2017  . Chronic pain of right knee 01/10/2017  . Primary osteoarthritis of right knee 01/10/2017  . Pain in right  foot 01/10/2017  . Primary osteoarthritis of left knee 01/10/2017  . Hyperlipidemia 11/09/2015  . Essential hypertension   . Chest pain 10/24/2015  . NSTEMI (non-ST elevated myocardial infarction) ([place]) 10/24/2015  . Diarrhea 10/24/2015  . Elevated troponin 10/24/2015  . Hypothyroidism 10/24/2015  . Stroke ([place])   . Hypertensive urgency   . GERD (gastroesophageal reflux disease)   . Gastroesophageal reflux disease without esophagitis       Labs reviewed: Basic Metabolic Panel:    Component Value Date/Time   NA 139 03/12/2017 1043   NA 139 03/12/2017   K 3.8 03/12/2017 1043   CL 108 03/12/2017 1043   CO2 25 03/12/2017 1043   GLUCOSE 115 (H) 03/12/2017 1043   BUN 11 03/12/2017 1043   BUN 11 03/12/2017   CREATININE 0.69 03/12/2017 1043   CALCIUM 9.0 03/12/2017 1043  PROT 7.5 10/24/2015 1700   ALBUMIN 3.5 10/24/2015 1700   AST 40 10/24/2015 1700   ALT 35 10/24/2015 1700   ALKPHOS 152 (H) 10/24/2015 1700   BILITOT 0.7 10/24/2015 1700   GFRNONAA >60 03/12/2017 1043   GFRAA >60 03/12/2017 1043     Recent Labs  03/12/17 03/12/17 1043  NA 139 139  K  --  3.8  CL  --  108  CO2  --  25  GLUCOSE  --  115*  BUN 11 11  CREATININE 0.7 0.69  CALCIUM  --  9.0   Liver Function Tests: No results for input(s): AST, ALT, ALKPHOS, BILITOT, PROT, ALBUMIN in the last 8760 hours. No results for input(s): LIPASE, AMYLASE in the last 8760 hours. No results for input(s): AMMONIA in the last 8760 hours. CBC:  Recent Labs  03/12/17 03/12/17 1043  WBC 6.5 6.5  HGB  --  13.0  HCT  --  39.1  MCV  --  93.3  PLT  --  324   Lipid No results for input(s): CHOL, HDL, LDLCALC, TRIG in the last 8760 hours.  Cardiac Enzymes: No results for input(s): CKTOTAL, CKMB, CKMBINDEX, TROPONINI in the last 8760 hours. BNP: No results for input(s): BNP in the last 8760 hours. No results found for: [facility] Lab Results  Component Value Date   HGBA1C 6.6 (H) 10/25/2015   Lab Results    Component Value Date   TSH 1.223 10/25/2015   No results found for: VITAMINB12 No results found for: FOLATE No results found for: IRON, TIBC, FERRITIN  Imaging and Procedures obtained prior to SNF admission: Dg Chest 2 View  Result Date: 03/12/2017 CLINICAL DATA:  Preop total knee replacement.  Hypertension. EXAM: CHEST  2 VIEW COMPARISON:  10/24/2015 FINDINGS: Heart and mediastinal contours are within normal limits. No focal opacities or effusions. No acute bony abnormality. IMPRESSION: No active cardiopulmonary disease. Electronically Signed   By: Rolm Baptise M.D.   On: 03/12/2017 11:45     Not all labs, radiology exams or other studies done during hospitalization come through on my EPIC note; however they are reviewed by me.    Assessment and Plan  L KNEE OA/ L KNEE ARTHROPLASTY - prophylaxed with xarelto SNF - pt is admitted for OT/PT; cont xarelto  HTN SNF - controlled; cont HCTZ 12.5 mg dail and  Metoprolol 25 mg BID  s/p NSTEMI  SNP - no problems with CP or SOB; plan to cont metoprolol 25 mg BID and ASA on hold while pt is on xarelto  HYPOTHYROIDISM SNF - not stated as uncontrolled; plan to cont synthroid 112 mcg daily   Time spent > 35 min;> 50% of time with patient was spent reviewing records, labs, tests and studies, counseling and developing plan of care  Webb Silversmith D. Sheppard Coil, MD

## 2017-03-28 LAB — BASIC METABOLIC PANEL
BUN: 16 mg/dL (ref 4–21)
Creatinine: 0.6 mg/dL (ref 0.5–1.1)
GLUCOSE: 110 mg/dL
Potassium: 4.7 mmol/L (ref 3.4–5.3)
SODIUM: 139 mmol/L (ref 137–147)

## 2017-03-28 LAB — CBC AND DIFFERENTIAL
HCT: 30 % — AB (ref 36–46)
HEMOGLOBIN: 9.7 g/dL — AB (ref 12.0–16.0)
Platelets: 430 10*3/uL — AB (ref 150–399)
WBC: 10.3 10^3/mL

## 2017-04-02 ENCOUNTER — Encounter: Payer: Self-pay | Admitting: Internal Medicine

## 2017-04-02 ENCOUNTER — Ambulatory Visit (INDEPENDENT_AMBULATORY_CARE_PROVIDER_SITE_OTHER): Payer: BLUE CROSS/BLUE SHIELD

## 2017-04-02 ENCOUNTER — Non-Acute Institutional Stay (SKILLED_NURSING_FACILITY): Payer: BLUE CROSS/BLUE SHIELD | Admitting: Internal Medicine

## 2017-04-02 ENCOUNTER — Ambulatory Visit (INDEPENDENT_AMBULATORY_CARE_PROVIDER_SITE_OTHER): Payer: BLUE CROSS/BLUE SHIELD | Admitting: Orthopedic Surgery

## 2017-04-02 ENCOUNTER — Encounter (INDEPENDENT_AMBULATORY_CARE_PROVIDER_SITE_OTHER): Payer: Self-pay | Admitting: Orthopedic Surgery

## 2017-04-02 DIAGNOSIS — Z96652 Presence of left artificial knee joint: Secondary | ICD-10-CM

## 2017-04-02 DIAGNOSIS — I252 Old myocardial infarction: Secondary | ICD-10-CM | POA: Diagnosis not present

## 2017-04-02 DIAGNOSIS — I10 Essential (primary) hypertension: Secondary | ICD-10-CM

## 2017-04-02 DIAGNOSIS — M1712 Unilateral primary osteoarthritis, left knee: Secondary | ICD-10-CM | POA: Diagnosis not present

## 2017-04-02 DIAGNOSIS — E034 Atrophy of thyroid (acquired): Secondary | ICD-10-CM

## 2017-04-02 NOTE — Progress Notes (Signed)
Post-Op Visit Note   Patient: Mary Dalton           Date of Birth: 03-05-1956           MRN: [id number] Visit Date: 04/02/2017 PCP: Velna Hatchet, MD   Assessment & Plan:  Chief Complaint:  Chief Complaint  Patient presents with  . Left Knee - Routine Post Op   Visit Diagnoses:  1. Status post total left knee replacement     Plan: [name] is a 61 year old female is now 2 weeks out left total knee replacement.  She's doing well.  She is at [facility] for rehabilitation.  She will be discharged tomorrow.  She has been getting physical therapy daily.  She set up to receive home health physical therapy starting tomorrow.  Taking oxycodone for pain but not much.  On exam she has about 5-10 in full extension and 90 of flexion incision is intact.  Plan is for 4 week return for clinical recheck.  We'll likely start outpatient physical therapy at that time  Follow-Up Instructions: Return in about 4 weeks (around 04/30/2017).   Orders:  Orders Placed This Encounter  Procedures  . XR Knee 1-2 Views Left   No orders of the defined types were placed in this encounter.   Imaging: Xr Knee 1-2 Views Left  Result Date: 04/02/2017 AP lateral left knee reviewed.  Total knee replacement in good position and alignment.  No complicating features.  No fractures.  Sizing looks appropriate.   PMFS History: Patient Active Problem List   Diagnosis Date Noted  . History of total knee arthroplasty 03/27/2017  . Arthritis of knee 03/20/2017  . Chronic pain of right knee 01/10/2017  . Primary osteoarthritis of right knee 01/10/2017  . Pain in right foot 01/10/2017  . Primary osteoarthritis of left knee 01/10/2017  . Hyperlipidemia 11/09/2015  . Essential hypertension   . Chest pain 10/24/2015  . History of non-ST elevation myocardial infarction (NSTEMI) 10/24/2015  . Diarrhea 10/24/2015  . Elevated troponin 10/24/2015  . Hypothyroidism 10/24/2015  . Stroke ([place])   . Hypertensive  urgency   . GERD (gastroesophageal reflux disease)   . Gastroesophageal reflux disease without esophagitis    Past Medical History:  Diagnosis Date  . Arthritis    "knees" (03/20/2017)  . CAD (coronary artery disease), native coronary artery 10/2015   diffuse disease, but non obstructive  . GERD (gastroesophageal reflux disease)   . Gout   . Hypertension   . Hypothyroidism   . Metabolic syndrome   . Stroke [facility])    "dx'd in 10/2015 when I went in for my cardiac cath" (03/20/2017)    Family History  Problem Relation Age of Onset  . Healthy Mother   . Diabetes Mother   . Healthy Father   . Healthy Brother   . Healthy Brother   . Healthy Brother     Past Surgical History:  Procedure Laterality Date  . CARDIAC CATHETERIZATION N/A 10/26/2015   Procedure: Left Heart Cath and Coronary Angiography;  Surgeon: Jettie Booze, MD;  Location: [place] CV LAB;  Service: Cardiovascular;  Laterality: N/A;  . JOINT REPLACEMENT    . LAPAROSCOPIC CHOLECYSTECTOMY    . SHOULDER ARTHROSCOPY WITH OPEN ROTATOR CUFF REPAIR Bilateral   . TOTAL KNEE ARTHROPLASTY Left 03/20/2017  . TOTAL KNEE ARTHROPLASTY Left 03/20/2017   Procedure: TOTAL KNEE ARTHROPLASTY;  Surgeon: Meredith Pel, MD;  Location: [place];  Service: Orthopedics;  Laterality: Left;  . TUBAL LIGATION  Social History   Occupational History  . Not on file.   Social History Main Topics  . Smoking status: Never Smoker  . Smokeless tobacco: Never Used  . Alcohol use No  . Drug use: No  . Sexual activity: No

## 2017-04-02 NOTE — Progress Notes (Signed)
Location:  [place] Room Number: 322G Place of Service:  SNF (31) Noah Delaine. Sheppard Coil, MD  PCP: Velna Hatchet, MD Patient Care Team: Velna Hatchet, MD as PCP - General (Internal Medicine)  Extended Emergency Contact Information Primary Emergency Contact: Raechel Chute [address] [address] of [place] Phone: [phone] Relation: Son Secondary Emergency Contact: Fuller [place] of [place] Mobile Phone: [phone] Relation: Son  Allergies  Allergen Reactions  . No Known Allergies     Chief Complaint  Patient presents with  . Discharge Note    Discharged from SNF    HPI:  61 y.o. female  with HTN, HLD , hypothyroidism GERD, s/ who was admitted to [facility] from 4/17-20 for a planned L knee arthroplasty for endstage DJD. Pt will be prophylaxed with xarelto.There were no complications . Pt is admitted to SNF for OT/PT and is now ready to be d/c to home.     Past Medical History:  Diagnosis Date  . Arthritis    "knees" (03/20/2017)  . CAD (coronary artery disease), native coronary artery 10/2015   diffuse disease, but non obstructive  . GERD (gastroesophageal reflux disease)   . Gout   . Hypertension   . Hypothyroidism   . Metabolic syndrome   . Stroke [facility])    "dx'd in 10/2015 when I went in for my cardiac cath" (03/20/2017)    Past Surgical History:  Procedure Laterality Date  . CARDIAC CATHETERIZATION N/A 10/26/2015   Procedure: Left Heart Cath and Coronary Angiography;  Surgeon: Jettie Booze, MD;  Location: [place] CV LAB;  Service: Cardiovascular;  Laterality: N/A;  . JOINT REPLACEMENT    . LAPAROSCOPIC CHOLECYSTECTOMY    . SHOULDER ARTHROSCOPY WITH OPEN ROTATOR CUFF REPAIR Bilateral   . TOTAL KNEE ARTHROPLASTY Left 03/20/2017  . TOTAL KNEE ARTHROPLASTY Left 03/20/2017   Procedure: TOTAL KNEE ARTHROPLASTY;  Surgeon: Meredith Pel, MD;  Location: [place];  Service:  Orthopedics;  Laterality: Left;  . TUBAL LIGATION       reports that she has never smoked. She has never used smokeless tobacco. She reports that she does not drink alcohol or use drugs. Social History   Social History  . Marital status: Widowed    Spouse name: N/A  . Number of children: N/A  . Years of education: N/A   Occupational History  . Not on file.   Social History Main Topics  . Smoking status: Never Smoker  . Smokeless tobacco: Never Used  . Alcohol use No  . Drug use: No  . Sexual activity: No   Other Topics Concern  . Not on file   Social History Narrative  . No narrative on file    Pertinent  Health Maintenance Due  Topic Date Due  . PAP SMEAR  04/13/1975  . MAMMOGRAM  04/12/2004  . COLONOSCOPY  04/12/2004  . INFLUENZA VACCINE  07/04/2017    Medications: Allergies as of 04/02/2017      Reactions   No Known Allergies       Medication List       Accurate as of 04/02/17 12:46 PM. Always use your most recent med list.          hydrochlorothiazide 12.5 MG capsule Commonly known as:  MICROZIDE Take 1 capsule (12.5 mg total) by mouth daily.   ICY HOT EX Apply 1 application topically at bedtime as needed (pain).   levothyroxine  112 MCG tablet Commonly known as:  SYNTHROID, LEVOTHROID Take 112 mcg by mouth daily before breakfast.   methocarbamol 500 MG tablet Commonly known as:  ROBAXIN Take 1 tablet (500 mg total) by mouth every 6 (six) hours as needed for muscle spasms.   metoprolol tartrate 25 MG tablet Commonly known as:  LOPRESSOR Take 1 tablet (25 mg total) by mouth 2 (two) times daily.   oxycodone 5 MG capsule Commonly known as:  OXY-IR Take 5-10 mg by mouth every 4 (four) hours as needed. 1 tablet as needed for mild pain, 2 tablets as needed for moderate to severe pain   rivaroxaban 10 MG Tabs tablet Commonly known as:  XARELTO Take 1 tablet (10 mg total) by mouth daily with breakfast.        Vitals:   04/02/17 1136  BP:  120/69  Pulse: 70  Resp: 16  Temp: 97.8 F (36.6 C)  SpO2: 97%  Weight: 243 lb (110.2 kg)  Height: 5\' 6"  (1.676 m)   Body mass index is 39.22 kg/m.  Physical Exam  GENERAL APPEARANCE: Alert, conversant. No acute distress.  HEENT: Unremarkable. RESPIRATORY: Breathing is even, unlabored. Lung sounds are clear   CARDIOVASCULAR: Heart RRR no murmurs, rubs or gallops. No peripheral edema.  GASTROINTESTINAL: Abdomen is soft, non-tender, not distended w/ normal bowel sounds.  NEUROLOGIC: Cranial nerves 2-12 grossly intact. Moves all extremities   Labs reviewed: Basic Metabolic Panel:  Recent Labs  03/12/17 03/12/17 1043 03/28/17  NA 139 139 139  K  --  3.8 4.7  CL  --  108  --   CO2  --  25  --   GLUCOSE  --  115*  --   BUN 11 11 16   CREATININE 0.7 0.69 0.6  CALCIUM  --  9.0  --    No results found for: [facility] Liver Function Tests: No results for input(s): AST, ALT, ALKPHOS, BILITOT, PROT, ALBUMIN in the last 8760 hours. No results for input(s): LIPASE, AMYLASE in the last 8760 hours. No results for input(s): AMMONIA in the last 8760 hours. CBC:  Recent Labs  03/12/17 03/12/17 1043 03/28/17  WBC 6.5 6.5 10.3  HGB  --  13.0 9.7*  HCT  --  39.1 30*  MCV  --  93.3  --   PLT  --  324 430*   Lipid No results for input(s): CHOL, HDL, LDLCALC, TRIG in the last 8760 hours. Cardiac Enzymes: No results for input(s): CKTOTAL, CKMB, CKMBINDEX, TROPONINI in the last 8760 hours. BNP: No results for input(s): BNP in the last 8760 hours. CBG: No results for input(s): GLUCAP in the last 8760 hours.  Procedures and Imaging Studies During Stay: Dg Chest 2 View  Result Date: 03/12/2017 CLINICAL DATA:  Preop total knee replacement.  Hypertension. EXAM: CHEST  2 VIEW COMPARISON:  10/24/2015 FINDINGS: Heart and mediastinal contours are within normal limits. No focal opacities or effusions. No acute bony abnormality. IMPRESSION: No active cardiopulmonary disease. Electronically  Signed   By: Rolm Baptise M.D.   On: 03/12/2017 11:45    Assessment/Plan:   History of total left knee replacement  Primary osteoarthritis of left knee  History of non-ST elevation myocardial infarction (NSTEMI)  Hypothyroidism due to acquired atrophy of thyroid  Essential hypertension   Patient is being discharged with the following home health services:  HH/OT/PT/Nursing  Patient is being discharged with the following durable medical equipment:  none  Patient has been advised to f/u with their PCP in  1-2 weeks to bring them up to date on their rehab stay.  Social services at facility was responsible for arranging this appointment.  Pt was provided with a 30 day supply of prescriptions for medications and refills must be obtained from their PCP.  For controlled substances, a more limited supply may be provided adequate until PCP appointment only.  Meds have been reconciled. Time spent > 30 min;> 50% of time with patient was spent reviewing records, labs, tests and studies, counseling and developing plan of care   Noah Delaine. Sheppard Coil, MD

## 2017-04-05 ENCOUNTER — Telehealth (INDEPENDENT_AMBULATORY_CARE_PROVIDER_SITE_OTHER): Payer: Self-pay

## 2017-04-05 DIAGNOSIS — Y791 Therapeutic (nonsurgical) and rehabilitative orthopedic devices associated with adverse incidents: Secondary | ICD-10-CM | POA: Diagnosis not present

## 2017-04-05 DIAGNOSIS — I2510 Atherosclerotic heart disease of native coronary artery without angina pectoris: Secondary | ICD-10-CM | POA: Diagnosis not present

## 2017-04-05 DIAGNOSIS — Z7901 Long term (current) use of anticoagulants: Secondary | ICD-10-CM | POA: Diagnosis not present

## 2017-04-05 DIAGNOSIS — I252 Old myocardial infarction: Secondary | ICD-10-CM | POA: Diagnosis not present

## 2017-04-05 DIAGNOSIS — Z96653 Presence of artificial knee joint, bilateral: Secondary | ICD-10-CM | POA: Diagnosis not present

## 2017-04-05 DIAGNOSIS — I10 Essential (primary) hypertension: Secondary | ICD-10-CM | POA: Diagnosis not present

## 2017-04-05 DIAGNOSIS — Z471 Aftercare following joint replacement surgery: Secondary | ICD-10-CM | POA: Diagnosis not present

## 2017-04-05 DIAGNOSIS — E785 Hyperlipidemia, unspecified: Secondary | ICD-10-CM | POA: Diagnosis not present

## 2017-04-05 DIAGNOSIS — Z9861 Coronary angioplasty status: Secondary | ICD-10-CM | POA: Diagnosis not present

## 2017-04-05 DIAGNOSIS — S81802D Unspecified open wound, left lower leg, subsequent encounter: Secondary | ICD-10-CM | POA: Diagnosis not present

## 2017-04-05 DIAGNOSIS — M109 Gout, unspecified: Secondary | ICD-10-CM | POA: Diagnosis not present

## 2017-04-05 DIAGNOSIS — Z8673 Personal history of transient ischemic attack (TIA), and cerebral infarction without residual deficits: Secondary | ICD-10-CM | POA: Diagnosis not present

## 2017-04-05 NOTE — Telephone Encounter (Signed)
Tiffany Hill from [facility] called needing nursing orders. CB [phone]

## 2017-04-05 NOTE — Telephone Encounter (Signed)
ok'd for 3x wk for 8 wks Has wound on posterior aspect of left knee----2-3cm below bend of knee she is going to try and treat with honey dressing 3x/wk for the next week and if not getting better will let us know so that we can get patient seen.

## 2017-04-09 DIAGNOSIS — I252 Old myocardial infarction: Secondary | ICD-10-CM | POA: Diagnosis not present

## 2017-04-09 DIAGNOSIS — Z471 Aftercare following joint replacement surgery: Secondary | ICD-10-CM | POA: Diagnosis not present

## 2017-04-09 DIAGNOSIS — Z96653 Presence of artificial knee joint, bilateral: Secondary | ICD-10-CM | POA: Diagnosis not present

## 2017-04-09 DIAGNOSIS — Z7901 Long term (current) use of anticoagulants: Secondary | ICD-10-CM | POA: Diagnosis not present

## 2017-04-09 DIAGNOSIS — Y791 Therapeutic (nonsurgical) and rehabilitative orthopedic devices associated with adverse incidents: Secondary | ICD-10-CM | POA: Diagnosis not present

## 2017-04-09 DIAGNOSIS — Z8673 Personal history of transient ischemic attack (TIA), and cerebral infarction without residual deficits: Secondary | ICD-10-CM | POA: Diagnosis not present

## 2017-04-09 DIAGNOSIS — Z9861 Coronary angioplasty status: Secondary | ICD-10-CM | POA: Diagnosis not present

## 2017-04-09 DIAGNOSIS — M109 Gout, unspecified: Secondary | ICD-10-CM | POA: Diagnosis not present

## 2017-04-09 DIAGNOSIS — S81802D Unspecified open wound, left lower leg, subsequent encounter: Secondary | ICD-10-CM | POA: Diagnosis not present

## 2017-04-09 DIAGNOSIS — I10 Essential (primary) hypertension: Secondary | ICD-10-CM | POA: Diagnosis not present

## 2017-04-09 DIAGNOSIS — E785 Hyperlipidemia, unspecified: Secondary | ICD-10-CM | POA: Diagnosis not present

## 2017-04-09 DIAGNOSIS — I2510 Atherosclerotic heart disease of native coronary artery without angina pectoris: Secondary | ICD-10-CM | POA: Diagnosis not present

## 2017-04-10 DIAGNOSIS — Z6839 Body mass index (BMI) 39.0-39.9, adult: Secondary | ICD-10-CM | POA: Diagnosis not present

## 2017-04-10 DIAGNOSIS — M25562 Pain in left knee: Secondary | ICD-10-CM | POA: Diagnosis not present

## 2017-04-11 DIAGNOSIS — Z96653 Presence of artificial knee joint, bilateral: Secondary | ICD-10-CM | POA: Diagnosis not present

## 2017-04-11 DIAGNOSIS — Y791 Therapeutic (nonsurgical) and rehabilitative orthopedic devices associated with adverse incidents: Secondary | ICD-10-CM | POA: Diagnosis not present

## 2017-04-11 DIAGNOSIS — Z8673 Personal history of transient ischemic attack (TIA), and cerebral infarction without residual deficits: Secondary | ICD-10-CM | POA: Diagnosis not present

## 2017-04-11 DIAGNOSIS — Z7901 Long term (current) use of anticoagulants: Secondary | ICD-10-CM | POA: Diagnosis not present

## 2017-04-11 DIAGNOSIS — S81802D Unspecified open wound, left lower leg, subsequent encounter: Secondary | ICD-10-CM | POA: Diagnosis not present

## 2017-04-11 DIAGNOSIS — I2510 Atherosclerotic heart disease of native coronary artery without angina pectoris: Secondary | ICD-10-CM | POA: Diagnosis not present

## 2017-04-11 DIAGNOSIS — Z9861 Coronary angioplasty status: Secondary | ICD-10-CM | POA: Diagnosis not present

## 2017-04-11 DIAGNOSIS — I10 Essential (primary) hypertension: Secondary | ICD-10-CM | POA: Diagnosis not present

## 2017-04-11 DIAGNOSIS — I252 Old myocardial infarction: Secondary | ICD-10-CM | POA: Diagnosis not present

## 2017-04-11 DIAGNOSIS — M109 Gout, unspecified: Secondary | ICD-10-CM | POA: Diagnosis not present

## 2017-04-11 DIAGNOSIS — E785 Hyperlipidemia, unspecified: Secondary | ICD-10-CM | POA: Diagnosis not present

## 2017-04-11 DIAGNOSIS — Z471 Aftercare following joint replacement surgery: Secondary | ICD-10-CM | POA: Diagnosis not present

## 2017-04-12 ENCOUNTER — Telehealth (INDEPENDENT_AMBULATORY_CARE_PROVIDER_SITE_OTHER): Payer: Self-pay | Admitting: Orthopedic Surgery

## 2017-04-12 NOTE — Telephone Encounter (Signed)
Called back, verbal orders given

## 2017-04-12 NOTE — Telephone Encounter (Signed)
Wes from PT called asking for verbal orders for the patient. 3 times a week for 2 weeks. CB # [phone]

## 2017-04-13 DIAGNOSIS — I252 Old myocardial infarction: Secondary | ICD-10-CM | POA: Diagnosis not present

## 2017-04-13 DIAGNOSIS — Y791 Therapeutic (nonsurgical) and rehabilitative orthopedic devices associated with adverse incidents: Secondary | ICD-10-CM | POA: Diagnosis not present

## 2017-04-13 DIAGNOSIS — Z9861 Coronary angioplasty status: Secondary | ICD-10-CM | POA: Diagnosis not present

## 2017-04-13 DIAGNOSIS — I2510 Atherosclerotic heart disease of native coronary artery without angina pectoris: Secondary | ICD-10-CM | POA: Diagnosis not present

## 2017-04-13 DIAGNOSIS — I10 Essential (primary) hypertension: Secondary | ICD-10-CM | POA: Diagnosis not present

## 2017-04-13 DIAGNOSIS — Z8673 Personal history of transient ischemic attack (TIA), and cerebral infarction without residual deficits: Secondary | ICD-10-CM | POA: Diagnosis not present

## 2017-04-13 DIAGNOSIS — M109 Gout, unspecified: Secondary | ICD-10-CM | POA: Diagnosis not present

## 2017-04-13 DIAGNOSIS — S81802D Unspecified open wound, left lower leg, subsequent encounter: Secondary | ICD-10-CM | POA: Diagnosis not present

## 2017-04-13 DIAGNOSIS — Z7901 Long term (current) use of anticoagulants: Secondary | ICD-10-CM | POA: Diagnosis not present

## 2017-04-13 DIAGNOSIS — E785 Hyperlipidemia, unspecified: Secondary | ICD-10-CM | POA: Diagnosis not present

## 2017-04-13 DIAGNOSIS — Z471 Aftercare following joint replacement surgery: Secondary | ICD-10-CM | POA: Diagnosis not present

## 2017-04-13 DIAGNOSIS — Z96653 Presence of artificial knee joint, bilateral: Secondary | ICD-10-CM | POA: Diagnosis not present

## 2017-04-16 DIAGNOSIS — M109 Gout, unspecified: Secondary | ICD-10-CM | POA: Diagnosis not present

## 2017-04-16 DIAGNOSIS — I252 Old myocardial infarction: Secondary | ICD-10-CM | POA: Diagnosis not present

## 2017-04-16 DIAGNOSIS — E785 Hyperlipidemia, unspecified: Secondary | ICD-10-CM | POA: Diagnosis not present

## 2017-04-16 DIAGNOSIS — Z96653 Presence of artificial knee joint, bilateral: Secondary | ICD-10-CM | POA: Diagnosis not present

## 2017-04-16 DIAGNOSIS — Z471 Aftercare following joint replacement surgery: Secondary | ICD-10-CM | POA: Diagnosis not present

## 2017-04-16 DIAGNOSIS — S81802D Unspecified open wound, left lower leg, subsequent encounter: Secondary | ICD-10-CM | POA: Diagnosis not present

## 2017-04-16 DIAGNOSIS — Z7901 Long term (current) use of anticoagulants: Secondary | ICD-10-CM | POA: Diagnosis not present

## 2017-04-16 DIAGNOSIS — I10 Essential (primary) hypertension: Secondary | ICD-10-CM | POA: Diagnosis not present

## 2017-04-16 DIAGNOSIS — I2510 Atherosclerotic heart disease of native coronary artery without angina pectoris: Secondary | ICD-10-CM | POA: Diagnosis not present

## 2017-04-16 DIAGNOSIS — Y791 Therapeutic (nonsurgical) and rehabilitative orthopedic devices associated with adverse incidents: Secondary | ICD-10-CM | POA: Diagnosis not present

## 2017-04-16 DIAGNOSIS — Z8673 Personal history of transient ischemic attack (TIA), and cerebral infarction without residual deficits: Secondary | ICD-10-CM | POA: Diagnosis not present

## 2017-04-16 DIAGNOSIS — Z9861 Coronary angioplasty status: Secondary | ICD-10-CM | POA: Diagnosis not present

## 2017-04-17 DIAGNOSIS — Z8673 Personal history of transient ischemic attack (TIA), and cerebral infarction without residual deficits: Secondary | ICD-10-CM | POA: Diagnosis not present

## 2017-04-17 DIAGNOSIS — Z96653 Presence of artificial knee joint, bilateral: Secondary | ICD-10-CM | POA: Diagnosis not present

## 2017-04-17 DIAGNOSIS — Z471 Aftercare following joint replacement surgery: Secondary | ICD-10-CM | POA: Diagnosis not present

## 2017-04-17 DIAGNOSIS — I10 Essential (primary) hypertension: Secondary | ICD-10-CM | POA: Diagnosis not present

## 2017-04-17 DIAGNOSIS — Y791 Therapeutic (nonsurgical) and rehabilitative orthopedic devices associated with adverse incidents: Secondary | ICD-10-CM | POA: Diagnosis not present

## 2017-04-17 DIAGNOSIS — I252 Old myocardial infarction: Secondary | ICD-10-CM | POA: Diagnosis not present

## 2017-04-17 DIAGNOSIS — I2510 Atherosclerotic heart disease of native coronary artery without angina pectoris: Secondary | ICD-10-CM | POA: Diagnosis not present

## 2017-04-17 DIAGNOSIS — S81802D Unspecified open wound, left lower leg, subsequent encounter: Secondary | ICD-10-CM | POA: Diagnosis not present

## 2017-04-17 DIAGNOSIS — Z7901 Long term (current) use of anticoagulants: Secondary | ICD-10-CM | POA: Diagnosis not present

## 2017-04-17 DIAGNOSIS — Z9861 Coronary angioplasty status: Secondary | ICD-10-CM | POA: Diagnosis not present

## 2017-04-17 DIAGNOSIS — E785 Hyperlipidemia, unspecified: Secondary | ICD-10-CM | POA: Diagnosis not present

## 2017-04-17 DIAGNOSIS — M109 Gout, unspecified: Secondary | ICD-10-CM | POA: Diagnosis not present

## 2017-04-18 DIAGNOSIS — Z9861 Coronary angioplasty status: Secondary | ICD-10-CM | POA: Diagnosis not present

## 2017-04-18 DIAGNOSIS — S81802D Unspecified open wound, left lower leg, subsequent encounter: Secondary | ICD-10-CM | POA: Diagnosis not present

## 2017-04-18 DIAGNOSIS — E785 Hyperlipidemia, unspecified: Secondary | ICD-10-CM | POA: Diagnosis not present

## 2017-04-18 DIAGNOSIS — Z96653 Presence of artificial knee joint, bilateral: Secondary | ICD-10-CM | POA: Diagnosis not present

## 2017-04-18 DIAGNOSIS — M109 Gout, unspecified: Secondary | ICD-10-CM | POA: Diagnosis not present

## 2017-04-18 DIAGNOSIS — Y791 Therapeutic (nonsurgical) and rehabilitative orthopedic devices associated with adverse incidents: Secondary | ICD-10-CM | POA: Diagnosis not present

## 2017-04-18 DIAGNOSIS — I10 Essential (primary) hypertension: Secondary | ICD-10-CM | POA: Diagnosis not present

## 2017-04-18 DIAGNOSIS — Z471 Aftercare following joint replacement surgery: Secondary | ICD-10-CM | POA: Diagnosis not present

## 2017-04-18 DIAGNOSIS — Z8673 Personal history of transient ischemic attack (TIA), and cerebral infarction without residual deficits: Secondary | ICD-10-CM | POA: Diagnosis not present

## 2017-04-18 DIAGNOSIS — I252 Old myocardial infarction: Secondary | ICD-10-CM | POA: Diagnosis not present

## 2017-04-18 DIAGNOSIS — I2510 Atherosclerotic heart disease of native coronary artery without angina pectoris: Secondary | ICD-10-CM | POA: Diagnosis not present

## 2017-04-18 DIAGNOSIS — Z7901 Long term (current) use of anticoagulants: Secondary | ICD-10-CM | POA: Diagnosis not present

## 2017-04-19 DIAGNOSIS — Y791 Therapeutic (nonsurgical) and rehabilitative orthopedic devices associated with adverse incidents: Secondary | ICD-10-CM | POA: Diagnosis not present

## 2017-04-19 DIAGNOSIS — I10 Essential (primary) hypertension: Secondary | ICD-10-CM | POA: Diagnosis not present

## 2017-04-19 DIAGNOSIS — I252 Old myocardial infarction: Secondary | ICD-10-CM | POA: Diagnosis not present

## 2017-04-19 DIAGNOSIS — I2510 Atherosclerotic heart disease of native coronary artery without angina pectoris: Secondary | ICD-10-CM | POA: Diagnosis not present

## 2017-04-19 DIAGNOSIS — Z9861 Coronary angioplasty status: Secondary | ICD-10-CM | POA: Diagnosis not present

## 2017-04-19 DIAGNOSIS — S81802D Unspecified open wound, left lower leg, subsequent encounter: Secondary | ICD-10-CM | POA: Diagnosis not present

## 2017-04-19 DIAGNOSIS — M109 Gout, unspecified: Secondary | ICD-10-CM | POA: Diagnosis not present

## 2017-04-19 DIAGNOSIS — Z96653 Presence of artificial knee joint, bilateral: Secondary | ICD-10-CM | POA: Diagnosis not present

## 2017-04-19 DIAGNOSIS — E785 Hyperlipidemia, unspecified: Secondary | ICD-10-CM | POA: Diagnosis not present

## 2017-04-19 DIAGNOSIS — Z8673 Personal history of transient ischemic attack (TIA), and cerebral infarction without residual deficits: Secondary | ICD-10-CM | POA: Diagnosis not present

## 2017-04-19 DIAGNOSIS — Z7901 Long term (current) use of anticoagulants: Secondary | ICD-10-CM | POA: Diagnosis not present

## 2017-04-19 DIAGNOSIS — Z471 Aftercare following joint replacement surgery: Secondary | ICD-10-CM | POA: Diagnosis not present

## 2017-04-20 ENCOUNTER — Telehealth (INDEPENDENT_AMBULATORY_CARE_PROVIDER_SITE_OTHER): Payer: Self-pay | Admitting: Orthopedic Surgery

## 2017-04-20 DIAGNOSIS — I252 Old myocardial infarction: Secondary | ICD-10-CM | POA: Diagnosis not present

## 2017-04-20 DIAGNOSIS — Z471 Aftercare following joint replacement surgery: Secondary | ICD-10-CM | POA: Diagnosis not present

## 2017-04-20 DIAGNOSIS — I2510 Atherosclerotic heart disease of native coronary artery without angina pectoris: Secondary | ICD-10-CM | POA: Diagnosis not present

## 2017-04-20 DIAGNOSIS — E785 Hyperlipidemia, unspecified: Secondary | ICD-10-CM | POA: Diagnosis not present

## 2017-04-20 DIAGNOSIS — Z96653 Presence of artificial knee joint, bilateral: Secondary | ICD-10-CM | POA: Diagnosis not present

## 2017-04-20 DIAGNOSIS — S81802D Unspecified open wound, left lower leg, subsequent encounter: Secondary | ICD-10-CM | POA: Diagnosis not present

## 2017-04-20 DIAGNOSIS — Z96659 Presence of unspecified artificial knee joint: Secondary | ICD-10-CM

## 2017-04-20 DIAGNOSIS — I10 Essential (primary) hypertension: Secondary | ICD-10-CM | POA: Diagnosis not present

## 2017-04-20 DIAGNOSIS — Z9861 Coronary angioplasty status: Secondary | ICD-10-CM | POA: Diagnosis not present

## 2017-04-20 DIAGNOSIS — Z8673 Personal history of transient ischemic attack (TIA), and cerebral infarction without residual deficits: Secondary | ICD-10-CM | POA: Diagnosis not present

## 2017-04-20 DIAGNOSIS — Z7901 Long term (current) use of anticoagulants: Secondary | ICD-10-CM | POA: Diagnosis not present

## 2017-04-20 DIAGNOSIS — Y791 Therapeutic (nonsurgical) and rehabilitative orthopedic devices associated with adverse incidents: Secondary | ICD-10-CM | POA: Diagnosis not present

## 2017-04-20 DIAGNOSIS — M109 Gout, unspecified: Secondary | ICD-10-CM | POA: Diagnosis not present

## 2017-04-20 NOTE — Telephone Encounter (Signed)
PT INPT THERAPY ENDED TODAY AND WANTS TO KNOW IF SHE CAN BE REFERRED FOR OUTPT THERAPY PLEASE.  [phone]

## 2017-04-20 NOTE — Telephone Encounter (Signed)
Please advise if ok. Thanks.

## 2017-04-21 NOTE — Telephone Encounter (Signed)
[place] for outpatient pt

## 2017-04-23 NOTE — Addendum Note (Signed)
Addended byLaurann Montana on: 04/23/2017 10:47 AM   Modules accepted: Orders

## 2017-04-23 NOTE — Telephone Encounter (Signed)
order put in patients chart

## 2017-04-26 ENCOUNTER — Telehealth (INDEPENDENT_AMBULATORY_CARE_PROVIDER_SITE_OTHER): Payer: Self-pay | Admitting: Orthopedic Surgery

## 2017-04-26 NOTE — Telephone Encounter (Signed)
Copy of records mailed to patient °

## 2017-05-02 ENCOUNTER — Ambulatory Visit (INDEPENDENT_AMBULATORY_CARE_PROVIDER_SITE_OTHER): Payer: BLUE CROSS/BLUE SHIELD | Admitting: Orthopedic Surgery

## 2017-05-02 ENCOUNTER — Encounter (INDEPENDENT_AMBULATORY_CARE_PROVIDER_SITE_OTHER): Payer: Self-pay | Admitting: Orthopedic Surgery

## 2017-05-02 ENCOUNTER — Encounter (INDEPENDENT_AMBULATORY_CARE_PROVIDER_SITE_OTHER): Payer: Self-pay

## 2017-05-02 DIAGNOSIS — Z96652 Presence of left artificial knee joint: Secondary | ICD-10-CM | POA: Insufficient documentation

## 2017-05-02 DIAGNOSIS — M1711 Unilateral primary osteoarthritis, right knee: Secondary | ICD-10-CM

## 2017-05-02 NOTE — Progress Notes (Signed)
Post-Op Visit Note   Patient: Mary Dalton           Date of Birth: [birth year]           MRN: [id number] Visit Date: 05/02/2017 PCP: Velna Hatchet, MD   Assessment & Plan:  Chief Complaint:  Chief Complaint  Patient presents with  . Left Knee - Routine Post Op   Visit Diagnoses:  1. Presence of left artificial knee joint   2. Primary osteoarthritis of right knee     Plan: Mary Dalton is a 61 year old patient who is now 6 weeks out left total knee replacement she's doing well.  Starting outpatient therapy next week on exam she has about 70 shy of full extension and 90 of flexion.  Right knee is bothering her.  We'll see her back in 6 weeks I'll order to focus on achieving full range of motion with that left knee.  Could discuss right total knee replacement in 6 weeks  Follow-Up Instructions: Return in about 6 weeks (around 06/13/2017).   Orders:  No orders of the defined types were placed in this encounter.  No orders of the defined types were placed in this encounter.   Imaging: No results found.  PMFS History: Patient Active Problem List   Diagnosis Date Noted  . Presence of left artificial knee joint 05/02/2017  . History of total knee arthroplasty 03/27/2017  . Arthritis of knee 03/20/2017  . Chronic pain of right knee 01/10/2017  . Primary osteoarthritis of right knee 01/10/2017  . Pain in right foot 01/10/2017  . Primary osteoarthritis of left knee 01/10/2017  . Hyperlipidemia 11/09/2015  . Essential hypertension   . Chest pain 10/24/2015  . History of non-ST elevation myocardial infarction (NSTEMI) 10/24/2015  . Diarrhea 10/24/2015  . Elevated troponin 10/24/2015  . Hypothyroidism 10/24/2015  . Stroke ([place])   . Hypertensive urgency   . GERD (gastroesophageal reflux disease)   . Gastroesophageal reflux disease without esophagitis    Past Medical History:  Diagnosis Date  . Arthritis    "knees" (03/20/2017)  . CAD (coronary artery disease), native  coronary artery 10/2015   diffuse disease, but non obstructive  . GERD (gastroesophageal reflux disease)   . Gout   . Hypertension   . Hypothyroidism   . Metabolic syndrome   . Stroke [facility])    "dx'd in 10/2015 when I went in for my cardiac cath" (03/20/2017)    Family History  Problem Relation Age of Onset  . Healthy Mother   . Diabetes Mother   . Healthy Father   . Healthy Brother   . Healthy Brother   . Healthy Brother     Past Surgical History:  Procedure Laterality Date  . CARDIAC CATHETERIZATION N/A 10/26/2015   Procedure: Left Heart Cath and Coronary Angiography;  Surgeon: Jettie Booze, MD;  Location: [place] CV LAB;  Service: Cardiovascular;  Laterality: N/A;  . JOINT REPLACEMENT    . LAPAROSCOPIC CHOLECYSTECTOMY    . SHOULDER ARTHROSCOPY WITH OPEN ROTATOR CUFF REPAIR Bilateral   . TOTAL KNEE ARTHROPLASTY Left 03/20/2017  . TOTAL KNEE ARTHROPLASTY Left 03/20/2017   Procedure: TOTAL KNEE ARTHROPLASTY;  Surgeon: Meredith Pel, MD;  Location: [place];  Service: Orthopedics;  Laterality: Left;  . TUBAL LIGATION     Social History   Occupational History  . Not on file.   Social History Main Topics  . Smoking status: Never Smoker  . Smokeless tobacco: Never Used  . Alcohol use No  .  Drug use: No  . Sexual activity: No

## 2017-05-10 ENCOUNTER — Ambulatory Visit: Payer: BLUE CROSS/BLUE SHIELD | Attending: Orthopedic Surgery | Admitting: Physical Therapy

## 2017-05-10 ENCOUNTER — Encounter: Payer: Self-pay | Admitting: Physical Therapy

## 2017-05-10 DIAGNOSIS — M6281 Muscle weakness (generalized): Secondary | ICD-10-CM | POA: Diagnosis not present

## 2017-05-10 DIAGNOSIS — M25562 Pain in left knee: Secondary | ICD-10-CM

## 2017-05-10 DIAGNOSIS — M25662 Stiffness of left knee, not elsewhere classified: Secondary | ICD-10-CM | POA: Diagnosis not present

## 2017-05-10 NOTE — Therapy (Signed)
[place], [place], [address] Phone: [phone]   Fax:  [phone]  Physical Therapy Evaluation  Patient Details  Name: Mary Dalton MRN: [id number] Date of Birth: Jun 03, 1956 Referring Provider: Meredith Pel, MD  Encounter Date: 05/10/2017      PT End of Session - 05/10/17 0938    Visit Number 1   Number of Visits 9   Date for PT Re-Evaluation 06/08/17   Authorization Type BCBS   PT Start Time 0932   PT Stop Time 1019   PT Time Calculation (min) 47 min   Activity Tolerance Patient tolerated treatment well   Behavior During Therapy [facility] for tasks assessed/performed      Past Medical History:  Diagnosis Date  . Arthritis    "knees" (03/20/2017)  . CAD (coronary artery disease), native coronary artery 10/2015   diffuse disease, but non obstructive  . GERD (gastroesophageal reflux disease)   . Gout   . Hypertension   . Hypothyroidism   . Metabolic syndrome   . Stroke [facility])    "dx'd in 10/2015 when I went in for my cardiac cath" (03/20/2017)    Past Surgical History:  Procedure Laterality Date  . CARDIAC CATHETERIZATION N/A 10/26/2015   Procedure: Left Heart Cath and Coronary Angiography;  Surgeon: Jettie Booze, MD;  Location: [place] [facility];  Service: Cardiovascular;  Laterality: N/A;  . JOINT REPLACEMENT    . LAPAROSCOPIC CHOLECYSTECTOMY    . SHOULDER ARTHROSCOPY WITH OPEN ROTATOR CUFF REPAIR Bilateral   . TOTAL KNEE ARTHROPLASTY Left 03/20/2017  . TOTAL KNEE ARTHROPLASTY Left 03/20/2017   Procedure: TOTAL KNEE ARTHROPLASTY;  Surgeon: Meredith Pel, MD;  Location: [place];  Service: Orthopedics;  Laterality: Left;  . TUBAL LIGATION      There were no vitals filed for this visit.       Subjective Assessment - 05/10/17 0940    Subjective Pt reports L knee feels good. Is now able to sleep well. Difficulty walking due to R knee which is being scheduled in about 8  weeks.    Currently in Pain? No/denies   Pain Score 0-No pain   Pain Location Knee   Pain Orientation Left   Aggravating Factors  lateral knee tender to touch, quad tightness            [facility] PT Assessment - 05/10/17 0001      Assessment   Medical Diagnosis s/p L TKA   Referring Provider Meredith Pel, MD   Onset Date/Surgical Date 03/20/17   Hand Dominance Right   Prior Therapy Inpatient rehab 14 days     Precautions   Precautions None     Restrictions   Weight Bearing Restrictions No     Balance Screen   Has the patient fallen in the past 6 months No     [place] residence   Living Arrangements Children   Additional Comments 2 steps to front door     Prior Function   Level of Independence Independent     Cognition   Overall Cognitive Status Within Functional Limits for tasks assessed     Observation/Other Assessments   Focus on Therapeutic Outcomes (FOTO)  37% ability (goal 54%)     Observation/Other Assessments-Edema    Edema Circumferential     Circumferential Edema   Circumferential - Right 50 cm   Circumferential - Left  53.5 cm     Sensation  Additional Comments WFL     ROM / Strength   AROM / PROM / Strength Strength;PROM     PROM   PROM Assessment Site Knee   Right/Left Knee Left   Left Knee Extension 0  resting at -2   Left Knee Flexion 89     Palpation   Patella mobility limited superior/inferior movement            Objective measurements completed on examination: See above findings.          [place] Adult PT Treatment/Exercise - 05/10/17 0001      Exercises   Exercises Knee/Hip     Knee/Hip Exercises: Stretches   Passive Hamstring Stretch Limitations supine with green strap   Knee: Self-Stretch Limitations heel slides with strap     Knee/Hip Exercises: Supine   Heel Slides Limitations active heel slide   Straight Leg Raises 10 reps     Knee/Hip Exercises: Sidelying    Hip ABduction 15 reps     Modalities   Modalities Cryotherapy     Cryotherapy   Number Minutes Cryotherapy 10 Minutes  2 min concurrent with education   Cryotherapy Location Knee  L   Type of Cryotherapy Ice pack     Manual Therapy   Manual Therapy Joint mobilization   Joint Mobilization superior/inferior patellar mobilization                PT Education - 05/10/17 1204    Education provided Yes   Education Details anatomy of condition, POC, HEP, exercise form/rationale, preparing R knee for surgery   Person(s) Educated Patient   Methods Explanation;Demonstration;Tactile cues;Verbal cues;Handout   Comprehension Verbalized understanding;Returned demonstration;Tactile cues required;Verbal cues required;Need further instruction          PT Short Term Goals - 05/10/17 1207      PT SHORT TERM GOAL #1   Title PROM 0-120   Baseline 0-89 at eval   Time 4   Period Weeks   Status New     PT SHORT TERM GOAL #2   Title Pt will be able to ambulate around her home without limitation presented by L knee    Baseline tightness and discomfort noted laterally  (pt is significantly limited by R knee)   Time 4   Period Weeks   Status New     PT SHORT TERM GOAL #3   Title hip MMT grossly 4+/5 to indicate appropraite support to joint by surrounding soft tissue   Baseline see flowsheet   Time 4   Period Weeks   Status New     PT SHORT TERM GOAL #4   Title Pt will be able to utilize L knee for primary support while navigating steps into and out of her home   Baseline severe difficulty at eval   Time 4   Period Weeks   Status New                   Plan - 05/10/17 1012    Clinical Impression Statement Pt presents to PT s/p L TKA in April 2018. Has since had inpatient and home health rehab, is having R knee replaced very soon. ROM 0-89 deg today and was educated on importance of being aggressive with ROM. Pt will benefit from skilled PT in order to improve ROM and  strength in L LE in order to improve functional use and prepare for R TKA    History and Personal Factors relevant to plan of care:  R knee OA   Clinical Presentation Stable   Clinical Presentation due to: N/A   Clinical Decision Making Low   Rehab Potential Good   PT Frequency 2x / week   PT Duration 4 weeks   PT Treatment/Interventions ADLs/Self Care Home Management;Cryotherapy;Electrical Stimulation;Iontophoresis 4mg /ml Dexamethasone;Functional mobility training;Stair training;Gait training;Ultrasound;Moist Heat;Therapeutic activities;Therapeutic exercise;Balance training;Neuromuscular re-education;Patient/family education;Passive range of motion;Scar mobilization;Manual techniques;Taping;Dry needling   PT Next Visit Plan bike if R knee can tolerate, ROM, quad/HS strength   PT Home Exercise Plan heel slides-active & passive, SLR, hip abduction, HS stretch with strap, ice 2/day 10 min with leg extended.    Consulted and Agree with Plan of Care Patient      Patient will benefit from skilled therapeutic intervention in order to improve the following deficits and impairments:  Abnormal gait, Decreased range of motion, Difficulty walking, Increased muscle spasms, Decreased activity tolerance, Pain, Improper body mechanics, Impaired flexibility, Decreased balance, Decreased strength, Decreased mobility, Increased edema, Postural dysfunction  Visit Diagnosis: Acute pain of left knee - Plan: PT plan of care cert/re-cert  Stiffness of left knee, not elsewhere classified - Plan: PT plan of care cert/re-cert  Muscle weakness (generalized) - Plan: PT plan of care cert/re-cert     Problem List Patient Active Problem List   Diagnosis Date Noted  . Presence of left artificial knee joint 05/02/2017  . History of total knee arthroplasty 03/27/2017  . Arthritis of knee 03/20/2017  . Chronic pain of right knee 01/10/2017  . Primary osteoarthritis of right knee 01/10/2017  . Pain in right foot  01/10/2017  . Primary osteoarthritis of left knee 01/10/2017  . Hyperlipidemia 11/09/2015  . Essential hypertension   . Chest pain 10/24/2015  . History of non-ST elevation myocardial infarction (NSTEMI) 10/24/2015  . Diarrhea 10/24/2015  . Elevated troponin 10/24/2015  . Hypothyroidism 10/24/2015  . Stroke ([place])   . Hypertensive urgency   . GERD (gastroesophageal reflux disease)   . Gastroesophageal reflux disease without esophagitis    Demba Nigh C. Hiilani Jetter PT, DPT 05/10/17 12:12 PM   [place], [place], [address] Phone: [phone]   Fax:  [phone]  Name: Mary Dalton MRN: [id number] Date of Birth: 10-30-1956

## 2017-05-14 ENCOUNTER — Encounter: Payer: Self-pay | Admitting: Physical Therapy

## 2017-05-14 ENCOUNTER — Ambulatory Visit: Payer: BLUE CROSS/BLUE SHIELD | Admitting: Physical Therapy

## 2017-05-14 DIAGNOSIS — M6281 Muscle weakness (generalized): Secondary | ICD-10-CM | POA: Diagnosis not present

## 2017-05-14 DIAGNOSIS — M25662 Stiffness of left knee, not elsewhere classified: Secondary | ICD-10-CM | POA: Diagnosis not present

## 2017-05-14 DIAGNOSIS — M25562 Pain in left knee: Secondary | ICD-10-CM | POA: Diagnosis not present

## 2017-05-14 NOTE — Therapy (Signed)
[place], [place], [address] Phone: [phone]   Fax:  [phone]  Physical Therapy Treatment  Patient Details  Name: Mary Dalton MRN: [id number] Date of Birth: [birth year] Referring Provider: Meredith Pel, MD  Encounter Date: 05/14/2017      PT End of Session - 05/14/17 1018    Visit Number 2   Number of Visits 9   Date for PT Re-Evaluation 06/08/17   Authorization Type BCBS   PT Start Time 1018   PT Stop Time 1100   PT Time Calculation (min) 42 min   Activity Tolerance Patient tolerated treatment well   Behavior During Therapy [facility] for tasks assessed/performed      Past Medical History:  Diagnosis Date  . Arthritis    "knees" (03/20/2017)  . CAD (coronary artery disease), native coronary artery 10/2015   diffuse disease, but non obstructive  . GERD (gastroesophageal reflux disease)   . Gout   . Hypertension   . Hypothyroidism   . Metabolic syndrome   . Stroke [facility])    "dx'd in 10/2015 when I went in for my cardiac cath" (03/20/2017)    Past Surgical History:  Procedure Laterality Date  . CARDIAC CATHETERIZATION N/A 10/26/2015   Procedure: Left Heart Cath and Coronary Angiography;  Surgeon: Jettie Booze, MD;  Location: [place] [facility];  Service: Cardiovascular;  Laterality: N/A;  . JOINT REPLACEMENT    . LAPAROSCOPIC CHOLECYSTECTOMY    . SHOULDER ARTHROSCOPY WITH OPEN ROTATOR CUFF REPAIR Bilateral   . TOTAL KNEE ARTHROPLASTY Left 03/20/2017  . TOTAL KNEE ARTHROPLASTY Left 03/20/2017   Procedure: TOTAL KNEE ARTHROPLASTY;  Surgeon: Meredith Pel, MD;  Location: [place];  Service: Orthopedics;  Laterality: Left;  . TUBAL LIGATION      There were no vitals filed for this visit.      Subjective Assessment - 05/14/17 1018    Subjective L knee feels wonderful.    Currently in Pain? No/denies            [facility] PT Assessment - 05/14/17 0001      PROM   Left  Knee Flexion 104                     [facility] Adult PT Treatment/Exercise - 05/14/17 0001      Knee/Hip Exercises: Stretches   Knee: Self-Stretch Limitations heel slides, green strap 2 min 10s holds   Gastroc Stretch 2 reps;30 seconds     Knee/Hip Exercises: Standing   Heel Raises 20 reps;10 reps   SLS with Vectors static and with head turns at counter   Gait Training heel toe, avoiding rocking     Knee/Hip Exercises: Seated   Heel Slides Limitations quick heel slides, L 2 min     Knee/Hip Exercises: Supine   Short Arc Quad Sets 10 reps   Short Arc Quad Sets Limitations 5s holds with eccentric lower   Straight Leg Raises 15 reps   Straight Leg Raises Limitations cues to decrease quad lag   Straight Leg Raise with External Rotation 15 reps     Knee/Hip Exercises: Sidelying   Hip ABduction 20 reps;10 reps     Cryotherapy   Number Minutes Cryotherapy 10 Minutes  5 min concurrent with HEP education   Cryotherapy Location Knee   Type of Cryotherapy Ice pack                PT Education - 05/14/17  1056    Education provided Yes   Education Details exercise form/rationale, HEP   Person(s) Educated Patient   Methods Explanation;Demonstration;Tactile cues;Verbal cues;Handout   Comprehension Verbalized understanding;Returned demonstration;Verbal cues required;Tactile cues required;Need further instruction          PT Short Term Goals - 05/10/17 1207      PT SHORT TERM GOAL #1   Title PROM 0-120   Baseline 0-89 at eval   Time 4   Period Weeks   Status New     PT SHORT TERM GOAL #2   Title Pt will be able to ambulate around her home without limitation presented by L knee    Baseline tightness and discomfort noted laterally  (pt is significantly limited by R knee)   Time 4   Period Weeks   Status New     PT SHORT TERM GOAL #3   Title hip MMT grossly 4+/5 to indicate appropraite support to joint by surrounding soft tissue   Baseline see flowsheet    Time 4   Period Weeks   Status New     PT SHORT TERM GOAL #4   Title Pt will be able to utilize L knee for primary support while navigating steps into and out of her home   Baseline severe difficulty at eval   Time 4   Period Weeks   Status New                  Plan - 05/14/17 1056    Clinical Impression Statement Exercises limited by ability to utilize R knee due to locking and pain. Pt denied pain with exercises today and made good progress in ROM (to 104 today).   PT Treatment/Interventions ADLs/Self Care Home Management;Cryotherapy;Electrical Stimulation;Iontophoresis 4mg /ml Dexamethasone;Functional mobility training;Stair training;Gait training;Ultrasound;Moist Heat;Therapeutic activities;Therapeutic exercise;Balance training;Neuromuscular re-education;Patient/family education;Passive range of motion;Scar mobilization;Manual techniques;Taping;Dry needling   PT Next Visit Plan bike if R knee can tolerate, ROM, quad/HS strength; knee extension stretch   PT Home Exercise Plan heel slides-active & passive, SLR, hip abduction, HS stretch with strap, ice 2/day 10 min with leg extended.    Consulted and Agree with Plan of Care Patient      Patient will benefit from skilled therapeutic intervention in order to improve the following deficits and impairments:  Abnormal gait, Decreased range of motion, Difficulty walking, Increased muscle spasms, Decreased activity tolerance, Pain, Improper body mechanics, Impaired flexibility, Decreased balance, Decreased strength, Decreased mobility, Increased edema, Postural dysfunction  Visit Diagnosis: Acute pain of left knee  Stiffness of left knee, not elsewhere classified  Muscle weakness (generalized)     Problem List Patient Active Problem List   Diagnosis Date Noted  . Presence of left artificial knee joint 05/02/2017  . History of total knee arthroplasty 03/27/2017  . Arthritis of knee 03/20/2017  . Chronic pain of right knee  01/10/2017  . Primary osteoarthritis of right knee 01/10/2017  . Pain in right foot 01/10/2017  . Primary osteoarthritis of left knee 01/10/2017  . Hyperlipidemia 11/09/2015  . Essential hypertension   . Chest pain 10/24/2015  . History of non-ST elevation myocardial infarction (NSTEMI) 10/24/2015  . Diarrhea 10/24/2015  . Elevated troponin 10/24/2015  . Hypothyroidism 10/24/2015  . Stroke ([place])   . Hypertensive urgency   . GERD (gastroesophageal reflux disease)   . Gastroesophageal reflux disease without esophagitis    Sora Vrooman C. Kyce Ging PT, DPT 05/14/17 11:00 AM   [place], [place],  [address] Phone: [phone]   Fax:  [phone]  Name: Mary Dalton MRN: [id number] Date of Birth: 06/10/1956

## 2017-05-15 ENCOUNTER — Encounter: Payer: Self-pay | Admitting: Physical Therapy

## 2017-05-15 ENCOUNTER — Ambulatory Visit: Payer: BLUE CROSS/BLUE SHIELD | Admitting: Physical Therapy

## 2017-05-15 DIAGNOSIS — M25662 Stiffness of left knee, not elsewhere classified: Secondary | ICD-10-CM | POA: Diagnosis not present

## 2017-05-15 DIAGNOSIS — M6281 Muscle weakness (generalized): Secondary | ICD-10-CM

## 2017-05-15 DIAGNOSIS — M25562 Pain in left knee: Secondary | ICD-10-CM

## 2017-05-15 NOTE — Therapy (Signed)
[place], [place], [address] Phone: [phone]   Fax:  [phone]  Physical Therapy Treatment  Patient Details  Name: Mary Dalton MRN: [id number] Date of Birth: 1956-02-17 Referring Provider: Meredith Pel, MD  Encounter Date: 05/15/2017      PT End of Session - 05/15/17 1324    Visit Number 3   Number of Visits 9   Date for PT Re-Evaluation 06/08/17   Authorization Type BCBS   PT Start Time 1331   PT Stop Time 1412   PT Time Calculation (min) 41 min   Activity Tolerance Patient tolerated treatment well   Behavior During Therapy [facility] for tasks assessed/performed      Past Medical History:  Diagnosis Date  . Arthritis    "knees" (03/20/2017)  . CAD (coronary artery disease), native coronary artery 10/2015   diffuse disease, but non obstructive  . GERD (gastroesophageal reflux disease)   . Gout   . Hypertension   . Hypothyroidism   . Metabolic syndrome   . Stroke [facility])    "dx'd in 10/2015 when I went in for my cardiac cath" (03/20/2017)    Past Surgical History:  Procedure Laterality Date  . CARDIAC CATHETERIZATION N/A 10/26/2015   Procedure: Left Heart Cath and Coronary Angiography;  Surgeon: Jettie Booze, MD;  Location: [place] [facility];  Service: Cardiovascular;  Laterality: N/A;  . JOINT REPLACEMENT    . LAPAROSCOPIC CHOLECYSTECTOMY    . SHOULDER ARTHROSCOPY WITH OPEN ROTATOR CUFF REPAIR Bilateral   . TOTAL KNEE ARTHROPLASTY Left 03/20/2017  . TOTAL KNEE ARTHROPLASTY Left 03/20/2017   Procedure: TOTAL KNEE ARTHROPLASTY;  Surgeon: Meredith Pel, MD;  Location: [place];  Service: Orthopedics;  Laterality: Left;  . TUBAL LIGATION      There were no vitals filed for this visit.      Subjective Assessment - 05/15/17 1332    Subjective pt denies pain in L knee today.    Currently in Pain? No/denies                         [facility] Adult PT  Treatment/Exercise - 05/15/17 0001      Knee/Hip Exercises: Stretches   Passive Hamstring Stretch 2 reps;30 seconds;Both   Passive Hamstring Stretch Limitations supine with green strap   Other Knee/Hip Stretches hamstring stretch seated EOB     Knee/Hip Exercises: Aerobic   Stationary Bike 5 min full revolutions     Knee/Hip Exercises: Standing   Heel Raises 20 reps;10 reps   Forward Step Up 15 reps;Step Height: 4"   SLS with Vectors static and with head turns at counter     Knee/Hip Exercises: Seated   Long Arc Quad 15 reps   Long Arc Quad Limitations 5s holds   Other Seated Knee/Hip Exercises bilateral ankle DF + eversion   Marching Limitations heel/toe raises     Knee/Hip Exercises: Supine   Straight Leg Raises 15 reps   Straight Leg Raises Limitations from heel on bolster     Knee/Hip Exercises: Sidelying   Hip ABduction 20 reps;Both     Knee/Hip Exercises: Prone   Hip Extension 20 reps;Both   Hip Extension Limitations fee rested on bolster                PT Education - 05/15/17 1335    Education provided Yes   Education Details exercise form/rationale, importance of gaining full extension in  both knees.    Person(s) Educated Patient   Methods Demonstration;Tactile cues;Verbal cues;Explanation   Comprehension Verbalized understanding;Returned demonstration;Verbal cues required;Tactile cues required;Need further instruction          PT Short Term Goals - 05/10/17 1207      PT SHORT TERM GOAL #1   Title PROM 0-120   Baseline 0-89 at eval   Time 4   Period Weeks   Status New     PT SHORT TERM GOAL #2   Title Pt will be able to ambulate around her home without limitation presented by L knee    Baseline tightness and discomfort noted laterally  (pt is significantly limited by R knee)   Time 4   Period Weeks   Status New     PT SHORT TERM GOAL #3   Title hip MMT grossly 4+/5 to indicate appropraite support to joint by surrounding soft tissue    Baseline see flowsheet   Time 4   Period Weeks   Status New     PT SHORT TERM GOAL #4   Title Pt will be able to utilize L knee for primary support while navigating steps into and out of her home   Baseline severe difficulty at eval   Time 4   Period Weeks   Status New                  Plan - 05/15/17 1416    Clinical Impression Statement Improved stability in SLS and no increase in pain with exercises. Cont to be limited in flexion ROM but consistenly extends to 0 deg without difficulty on L.    PT Treatment/Interventions ADLs/Self Care Home Management;Cryotherapy;Electrical Stimulation;Iontophoresis 4mg /ml Dexamethasone;Functional mobility training;Stair training;Gait training;Ultrasound;Moist Heat;Therapeutic activities;Therapeutic exercise;Balance training;Neuromuscular re-education;Patient/family education;Passive range of motion;Scar mobilization;Manual techniques;Taping;Dry needling   PT Next Visit Plan bike, CKC L strengthening   PT Home Exercise Plan heel slides-active & passive, SLR, hip abduction, HS stretch with strap, ice 2/day 10 min with leg extended. ; seated HSS   Consulted and Agree with Plan of Care Patient      Patient will benefit from skilled therapeutic intervention in order to improve the following deficits and impairments:  Abnormal gait, Decreased range of motion, Difficulty walking, Increased muscle spasms, Decreased activity tolerance, Pain, Improper body mechanics, Impaired flexibility, Decreased balance, Decreased strength, Decreased mobility, Increased edema, Postural dysfunction  Visit Diagnosis: Acute pain of left knee  Stiffness of left knee, not elsewhere classified  Muscle weakness (generalized)     Problem List Patient Active Problem List   Diagnosis Date Noted  . Presence of left artificial knee joint 05/02/2017  . History of total knee arthroplasty 03/27/2017  . Arthritis of knee 03/20/2017  . Chronic pain of right knee  01/10/2017  . Primary osteoarthritis of right knee 01/10/2017  . Pain in right foot 01/10/2017  . Primary osteoarthritis of left knee 01/10/2017  . Hyperlipidemia 11/09/2015  . Essential hypertension   . Chest pain 10/24/2015  . History of non-ST elevation myocardial infarction (NSTEMI) 10/24/2015  . Diarrhea 10/24/2015  . Elevated troponin 10/24/2015  . Hypothyroidism 10/24/2015  . Stroke ([place])   . Hypertensive urgency   . GERD (gastroesophageal reflux disease)   . Gastroesophageal reflux disease without esophagitis     Zaidyn Claire C. Xerxes Agrusa PT, DPT 05/15/17 2:18 PM   [facility] [address] [place], [place], [address] Phone: [phone]   Fax:  [phone]  Name: Mary Dalton MRN: [id number]  Date of Birth: 1956/07/18

## 2017-05-22 ENCOUNTER — Encounter: Payer: Self-pay | Admitting: Physical Therapy

## 2017-05-22 ENCOUNTER — Ambulatory Visit: Payer: BLUE CROSS/BLUE SHIELD | Admitting: Physical Therapy

## 2017-05-22 DIAGNOSIS — M6281 Muscle weakness (generalized): Secondary | ICD-10-CM

## 2017-05-22 DIAGNOSIS — M25562 Pain in left knee: Secondary | ICD-10-CM | POA: Diagnosis not present

## 2017-05-22 DIAGNOSIS — M25662 Stiffness of left knee, not elsewhere classified: Secondary | ICD-10-CM | POA: Diagnosis not present

## 2017-05-22 NOTE — Therapy (Signed)
[facility] [place], [place], [address] Phone: [phone]   Fax:  [phone]  Physical Therapy Treatment  Patient Details  Name: Mary Dalton MRN: [id number] Date of Birth: [birth year] Referring Provider: Meredith Pel, MD  Encounter Date: 05/22/2017      PT End of Session - 05/22/17 1012    Visit Number 4   Number of Visits 9   Date for PT Re-Evaluation 06/08/17   Authorization Type BCBS   PT Start Time 1015   PT Stop Time 1053   PT Time Calculation (min) 38 min   Activity Tolerance Patient tolerated treatment well;Patient limited by pain   Behavior During Therapy [facility] for tasks assessed/performed      Past Medical History:  Diagnosis Date  . Arthritis    "knees" (03/20/2017)  . CAD (coronary artery disease), native coronary artery 10/2015   diffuse disease, but non obstructive  . GERD (gastroesophageal reflux disease)   . Gout   . Hypertension   . Hypothyroidism   . Metabolic syndrome   . Stroke [facility])    "dx'd in 10/2015 when I went in for my cardiac cath" (03/20/2017)    Past Surgical History:  Procedure Laterality Date  . CARDIAC CATHETERIZATION N/A 10/26/2015   Procedure: Left Heart Cath and Coronary Angiography;  Surgeon: Jettie Booze, MD;  Location: [place] [facility];  Service: Cardiovascular;  Laterality: N/A;  . JOINT REPLACEMENT    . LAPAROSCOPIC CHOLECYSTECTOMY    . SHOULDER ARTHROSCOPY WITH OPEN ROTATOR CUFF REPAIR Bilateral   . TOTAL KNEE ARTHROPLASTY Left 03/20/2017  . TOTAL KNEE ARTHROPLASTY Left 03/20/2017   Procedure: TOTAL KNEE ARTHROPLASTY;  Surgeon: Meredith Pel, MD;  Location: [place];  Service: Orthopedics;  Laterality: Left;  . TUBAL LIGATION      There were no vitals filed for this visit.      Subjective Assessment - 05/22/17 1015    Subjective L knee feels wonderful. R knee cont to be limiting factor   Currently in Pain? No/denies             [facility] PT Assessment - 05/22/17 0001      PROM   Left Knee Extension 0   Left Knee Flexion 105                     [facility] Adult PT Treatment/Exercise - 05/22/17 0001      Knee/Hip Exercises: Stretches   Knee: Self-Stretch Limitations heel slides, green strap 2 min 10s holds   Gastroc Stretch 2 reps;30 seconds   Other Knee/Hip Stretches thomas test stretch     Knee/Hip Exercises: Aerobic   Stationary Bike 5 min full revolutions     Knee/Hip Exercises: Standing   Heel Raises 20 reps   Forward Step Up Step Height: 4";15 reps   Other Standing Knee Exercises step over hurdle to heel strike     Knee/Hip Exercises: Seated   Marching Limitations heel/toe raises     Knee/Hip Exercises: Supine   Straight Leg Raises 10 reps   Straight Leg Raises Limitations raise with abd/add   Straight Leg Raise with External Rotation 10 reps   Straight Leg Raise with External Rotation Limitations with abd/add     Knee/Hip Exercises: Sidelying   Hip ABduction Both;20 reps     Knee/Hip Exercises: Prone   Hamstring Curl 20 reps  left only   Hamstring Curl Limitations prone, feet off edge of bed  Hip Extension 20 reps;Both                PT Education - 05/22/17 1012    Education provided Yes   Education Details exercise form/rationale   Person(s) Educated Patient   Methods Explanation;Demonstration;Tactile cues;Verbal cues   Comprehension Verbalized understanding;Returned demonstration;Tactile cues required;Need further instruction;Verbal cues required          PT Short Term Goals - 05/10/17 1207      PT SHORT TERM GOAL #1   Title PROM 0-120   Baseline 0-89 at eval   Time 4   Period Weeks   Status New     PT SHORT TERM GOAL #2   Title Pt will be able to ambulate around her home without limitation presented by L knee    Baseline tightness and discomfort noted laterally  (pt is significantly limited by R knee)   Time 4   Period Weeks   Status New     PT SHORT  TERM GOAL #3   Title hip MMT grossly 4+/5 to indicate appropraite support to joint by surrounding soft tissue   Baseline see flowsheet   Time 4   Period Weeks   Status New     PT SHORT TERM GOAL #4   Title Pt will be able to utilize L knee for primary support while navigating steps into and out of her home   Baseline severe difficulty at eval   Time 4   Period Weeks   Status New                  Plan - 05/22/17 1054    Clinical Impression Statement standing activities limited today by pain in right knee. cont to demo improved strength in L knee, very minimal quad lag in SLR.    PT Treatment/Interventions ADLs/Self Care Home Management;Cryotherapy;Electrical Stimulation;Iontophoresis 4mg /ml Dexamethasone;Functional mobility training;Stair training;Gait training;Ultrasound;Moist Heat;Therapeutic activities;Therapeutic exercise;Balance training;Neuromuscular re-education;Patient/family education;Passive range of motion;Scar mobilization;Manual techniques;Taping;Dry needling   PT Next Visit Plan bike, CKC L strengthening, flexion ROM   PT Home Exercise Plan heel slides-active & passive, SLR, hip abduction, HS stretch with strap, ice 2/day 10 min with leg extended. ; seated HSS; thomas test stretch   Consulted and Agree with Plan of Care Patient      Patient will benefit from skilled therapeutic intervention in order to improve the following deficits and impairments:  Abnormal gait, Decreased range of motion, Difficulty walking, Increased muscle spasms, Decreased activity tolerance, Pain, Improper body mechanics, Impaired flexibility, Decreased balance, Decreased strength, Decreased mobility, Increased edema, Postural dysfunction  Visit Diagnosis: Acute pain of left knee  Stiffness of left knee, not elsewhere classified  Muscle weakness (generalized)     Problem List Patient Active Problem List   Diagnosis Date Noted  . Presence of left artificial knee joint 05/02/2017   . History of total knee arthroplasty 03/27/2017  . Arthritis of knee 03/20/2017  . Chronic pain of right knee 01/10/2017  . Primary osteoarthritis of right knee 01/10/2017  . Pain in right foot 01/10/2017  . Primary osteoarthritis of left knee 01/10/2017  . Hyperlipidemia 11/09/2015  . Essential hypertension   . Chest pain 10/24/2015  . History of non-ST elevation myocardial infarction (NSTEMI) 10/24/2015  . Diarrhea 10/24/2015  . Elevated troponin 10/24/2015  . Hypothyroidism 10/24/2015  . Stroke ([place])   . Hypertensive urgency   . GERD (gastroesophageal reflux disease)   . Gastroesophageal reflux disease without esophagitis   Dustan Hyams C. Niesha Bame PT, DPT 05/22/17  10:55 AM   [facility] [address] [place], [place], [address] Phone: [phone]   Fax:  [phone]  Name: LINSY EHRESMAN MRN: [id number] Date of Birth: 06-May-1956

## 2017-05-24 ENCOUNTER — Encounter: Payer: Self-pay | Admitting: Physical Therapy

## 2017-05-24 ENCOUNTER — Ambulatory Visit: Payer: BLUE CROSS/BLUE SHIELD | Admitting: Physical Therapy

## 2017-05-24 DIAGNOSIS — M25562 Pain in left knee: Secondary | ICD-10-CM | POA: Diagnosis not present

## 2017-05-24 DIAGNOSIS — M25662 Stiffness of left knee, not elsewhere classified: Secondary | ICD-10-CM | POA: Diagnosis not present

## 2017-05-24 DIAGNOSIS — Z0000 Encounter for general adult medical examination without abnormal findings: Secondary | ICD-10-CM | POA: Diagnosis not present

## 2017-05-24 DIAGNOSIS — M6281 Muscle weakness (generalized): Secondary | ICD-10-CM

## 2017-05-24 DIAGNOSIS — R8299 Other abnormal findings in urine: Secondary | ICD-10-CM | POA: Diagnosis not present

## 2017-05-24 DIAGNOSIS — E119 Type 2 diabetes mellitus without complications: Secondary | ICD-10-CM | POA: Diagnosis not present

## 2017-05-24 DIAGNOSIS — E039 Hypothyroidism, unspecified: Secondary | ICD-10-CM | POA: Diagnosis not present

## 2017-05-24 DIAGNOSIS — M109 Gout, unspecified: Secondary | ICD-10-CM | POA: Diagnosis not present

## 2017-05-24 NOTE — Therapy (Signed)
[place], [place], [address] Phone: [phone]   Fax:  [phone]  Physical Therapy Treatment  Patient Details  Name: Mary Dalton MRN: [id number] Date of Birth: [birth year] Referring Provider: Meredith Pel, MD  Encounter Date: 05/24/2017      PT End of Session - 05/24/17 1016    Visit Number 5   Number of Visits 9   Date for PT Re-Evaluation 06/08/17   Authorization Type BCBS   PT Start Time 1016   PT Stop Time 1054   PT Time Calculation (min) 38 min   Activity Tolerance Patient tolerated treatment well   Behavior During Therapy [facility] for tasks assessed/performed      Past Medical History:  Diagnosis Date  . Arthritis    "knees" (03/20/2017)  . CAD (coronary artery disease), native coronary artery 10/2015   diffuse disease, but non obstructive  . GERD (gastroesophageal reflux disease)   . Gout   . Hypertension   . Hypothyroidism   . Metabolic syndrome   . Stroke [facility])    "dx'd in 10/2015 when I went in for my cardiac cath" (03/20/2017)    Past Surgical History:  Procedure Laterality Date  . CARDIAC CATHETERIZATION N/A 10/26/2015   Procedure: Left Heart Cath and Coronary Angiography;  Surgeon: Jettie Booze, MD;  Location: [place] [facility];  Service: Cardiovascular;  Laterality: N/A;  . JOINT REPLACEMENT    . LAPAROSCOPIC CHOLECYSTECTOMY    . SHOULDER ARTHROSCOPY WITH OPEN ROTATOR CUFF REPAIR Bilateral   . TOTAL KNEE ARTHROPLASTY Left 03/20/2017  . TOTAL KNEE ARTHROPLASTY Left 03/20/2017   Procedure: TOTAL KNEE ARTHROPLASTY;  Surgeon: Meredith Pel, MD;  Location: [place];  Service: Orthopedics;  Laterality: Left;  . TUBAL LIGATION      There were no vitals filed for this visit.      Subjective Assessment - 05/24/17 1016    Subjective L knee feels good, I can't wait to get the R one done   Currently in Pain? No/denies            [facility] PT Assessment -  05/24/17 0001      PROM   Left Knee Flexion 105  R knee to 110                     [facility] Adult PT Treatment/Exercise - 05/24/17 0001      Knee/Hip Exercises: Stretches   Passive Hamstring Stretch Both;2 reps;30 seconds   Passive Hamstring Stretch Limitations seated EOB with green strap   Knee: Self-Stretch Limitations heel slides green strap 3 min     Knee/Hip Exercises: Aerobic   Stationary Bike 7 min full revolutions     Knee/Hip Exercises: Seated   Ball Squeeze ball squeeze with LAQ x15; squeeze on physioball small trunk twists   Other Seated Knee/Hip Exercises plantarflexion into black tband; isometric press into physioball     Knee/Hip Exercises: Supine   Short Arc [employer] 20 reps   Short Arc Quad Sets Limitations 2# ankle weight   Straight Leg Raises 15 reps   Straight Leg Raises Limitations 2# ankle weight     Knee/Hip Exercises: Sidelying   Hip ABduction 20 reps   Hip ABduction Limitations 2# ankle weight     Knee/Hip Exercises: Prone   Hamstring Curl 20 reps   Hamstring Curl Limitations 2# ankle weight   Hip Extension 15 reps   Hip Extension Limitations toes curled under,  quad set to hip ext                PT Education - 05/24/17 1020    Education provided Yes   Education Details exercise form/rationale, HEP   Person(s) Educated Patient   Methods Explanation;Demonstration;Tactile cues;Verbal cues;Handout   Comprehension Verbalized understanding;Returned demonstration;Verbal cues required;Tactile cues required;Need further instruction          PT Short Term Goals - 05/10/17 1207      PT SHORT TERM GOAL #1   Title PROM 0-120   Baseline 0-89 at eval   Time 4   Period Weeks   Status New     PT SHORT TERM GOAL #2   Title Pt will be able to ambulate around her home without limitation presented by L knee    Baseline tightness and discomfort noted laterally  (pt is significantly limited by R knee)   Time 4   Period Weeks   Status  New     PT SHORT TERM GOAL #3   Title hip MMT grossly 4+/5 to indicate appropraite support to joint by surrounding soft tissue   Baseline see flowsheet   Time 4   Period Weeks   Status New     PT SHORT TERM GOAL #4   Title Pt will be able to utilize L knee for primary support while navigating steps into and out of her home   Baseline severe difficulty at eval   Time 4   Period Weeks   Status New                  Plan - 05/24/17 1057    Clinical Impression Statement Added exercises to challenge core control today as well as increased weight to exercises. R knee able to flex to 110, pt and I discussed reaching 110 by the end of next week.    PT Treatment/Interventions ADLs/Self Care Home Management;Cryotherapy;Electrical Stimulation;Iontophoresis 4mg /ml Dexamethasone;Functional mobility training;Stair training;Gait training;Ultrasound;Moist Heat;Therapeutic activities;Therapeutic exercise;Balance training;Neuromuscular re-education;Patient/family education;Passive range of motion;Scar mobilization;Manual techniques;Taping;Dry needling   PT Next Visit Plan bike, CKC L strengthening, flexion ROM   PT Home Exercise Plan heel slides-active & passive, SLR, hip abduction, HS stretch with strap, ice 2/day 10 min with leg extended. ; seated HSS; thomas test stretch   Consulted and Agree with Plan of Care Patient      Patient will benefit from skilled therapeutic intervention in order to improve the following deficits and impairments:  Abnormal gait, Decreased range of motion, Difficulty walking, Increased muscle spasms, Decreased activity tolerance, Pain, Improper body mechanics, Impaired flexibility, Decreased balance, Decreased strength, Decreased mobility, Increased edema, Postural dysfunction  Visit Diagnosis: Acute pain of left knee  Stiffness of left knee, not elsewhere classified  Muscle weakness (generalized)     Problem List Patient Active Problem List   Diagnosis  Date Noted  . Presence of left artificial knee joint 05/02/2017  . History of total knee arthroplasty 03/27/2017  . Arthritis of knee 03/20/2017  . Chronic pain of right knee 01/10/2017  . Primary osteoarthritis of right knee 01/10/2017  . Pain in right foot 01/10/2017  . Primary osteoarthritis of left knee 01/10/2017  . Hyperlipidemia 11/09/2015  . Essential hypertension   . Chest pain 10/24/2015  . History of non-ST elevation myocardial infarction (NSTEMI) 10/24/2015  . Diarrhea 10/24/2015  . Elevated troponin 10/24/2015  . Hypothyroidism 10/24/2015  . Stroke ([place])   . Hypertensive urgency   . GERD (gastroesophageal reflux disease)   . Gastroesophageal  reflux disease without esophagitis     Arslan Kier C. Zaylan Kissoon PT, DPT 05/24/17 10:59 AM   [facility] [address] [place], [place], [address] Phone: [phone]   Fax:  [phone]  Name: Mary Dalton MRN: [id number] Date of Birth: March 20, 1956

## 2017-05-28 ENCOUNTER — Ambulatory Visit: Payer: BLUE CROSS/BLUE SHIELD | Admitting: Physical Therapy

## 2017-05-28 ENCOUNTER — Encounter: Payer: Self-pay | Admitting: Physical Therapy

## 2017-05-28 DIAGNOSIS — M25562 Pain in left knee: Secondary | ICD-10-CM | POA: Diagnosis not present

## 2017-05-28 DIAGNOSIS — M6281 Muscle weakness (generalized): Secondary | ICD-10-CM | POA: Diagnosis not present

## 2017-05-28 DIAGNOSIS — M25662 Stiffness of left knee, not elsewhere classified: Secondary | ICD-10-CM

## 2017-05-28 NOTE — Therapy (Signed)
[place], [place], [address] Phone: [phone]   Fax:  [phone]  Physical Therapy Treatment  Patient Details  Name: Mary Dalton MRN: [id number] Date of Birth: 1956-08-13 Referring Provider: Meredith Pel, MD  Encounter Date: 05/28/2017      PT End of Session - 05/28/17 1026    Visit Number 6   Number of Visits 9   Date for PT Re-Evaluation 06/08/17   Authorization Type BCBS   PT Start Time 1020   PT Stop Time 1106   PT Time Calculation (min) 46 min   Activity Tolerance Patient tolerated treatment well;Patient limited by pain   Behavior During Therapy [facility] for tasks assessed/performed      Past Medical History:  Diagnosis Date  . Arthritis    "knees" (03/20/2017)  . CAD (coronary artery disease), native coronary artery 10/2015   diffuse disease, but non obstructive  . GERD (gastroesophageal reflux disease)   . Gout   . Hypertension   . Hypothyroidism   . Metabolic syndrome   . Stroke [facility])    "dx'd in 10/2015 when I went in for my cardiac cath" (03/20/2017)    Past Surgical History:  Procedure Laterality Date  . CARDIAC CATHETERIZATION N/A 10/26/2015   Procedure: Left Heart Cath and Coronary Angiography;  Surgeon: Jettie Booze, MD;  Location: [place] [facility];  Service: Cardiovascular;  Laterality: N/A;  . JOINT REPLACEMENT    . LAPAROSCOPIC CHOLECYSTECTOMY    . SHOULDER ARTHROSCOPY WITH OPEN ROTATOR CUFF REPAIR Bilateral   . TOTAL KNEE ARTHROPLASTY Left 03/20/2017  . TOTAL KNEE ARTHROPLASTY Left 03/20/2017   Procedure: TOTAL KNEE ARTHROPLASTY;  Surgeon: Meredith Pel, MD;  Location: [place];  Service: Orthopedics;  Laterality: Left;  . TUBAL LIGATION      There were no vitals filed for this visit.      Subjective Assessment - 05/28/17 1026    Subjective Doing okay.    Currently in Pain? No/denies            [facility] PT Assessment - 05/28/17 0001       PROM   Left Knee Flexion 105                     [facility] Adult PT Treatment/Exercise - 05/28/17 0001      Knee/Hip Exercises: Stretches   Knee: Self-Stretch Limitations heel slides green strap 3 min  10s holds     Knee/Hip Exercises: Aerobic   Stationary Bike unable due to R knee pain today     Knee/Hip Exercises: Supine   Heel Slides 20 reps   Heel Slides Limitations foot in DF   Bridges Limitations x15   Straight Leg Raises 15 reps   Straight Leg Raises Limitations with abd/add   Straight Leg Raise with External Rotation 15 reps   Other Supine Knee/Hip Exercises hooklying controlled bent knee fall outs     Knee/Hip Exercises: Sidelying   Hip ABduction 20 reps;10 reps;Both     Cryotherapy   Number Minutes Cryotherapy 10 Minutes   Cryotherapy Location Knee   Type of Cryotherapy Ice pack     Manual Therapy   Manual Therapy Soft tissue mobilization   Joint Mobilization patellar sup/inf mobs   Soft tissue mobilization roller L quads                  PT Short Term Goals - 05/10/17 1207  PT SHORT TERM GOAL #1   Title PROM 0-120   Baseline 0-89 at eval   Time 4   Period Weeks   Status New     PT SHORT TERM GOAL #2   Title Pt will be able to ambulate around her home without limitation presented by L knee    Baseline tightness and discomfort noted laterally  (pt is significantly limited by R knee)   Time 4   Period Weeks   Status New     PT SHORT TERM GOAL #3   Title hip MMT grossly 4+/5 to indicate appropraite support to joint by surrounding soft tissue   Baseline see flowsheet   Time 4   Period Weeks   Status New     PT SHORT TERM GOAL #4   Title Pt will be able to utilize L knee for primary support while navigating steps into and out of her home   Baseline severe difficulty at eval   Time 4   Period Weeks   Status New                  Plan - 05/28/17 1047    Clinical Impression Statement Soreness in quadriceps and  limited patellar mobility addressed today which increased avail flexion ROM by a couple of degrees. Limited on bike today by R knee pain.    PT Treatment/Interventions ADLs/Self Care Home Management;Cryotherapy;Electrical Stimulation;Iontophoresis 4mg /ml Dexamethasone;Functional mobility training;Stair training;Gait training;Ultrasound;Moist Heat;Therapeutic activities;Therapeutic exercise;Balance training;Neuromuscular re-education;Patient/family education;Passive range of motion;Scar mobilization;Manual techniques;Taping;Dry needling   PT Next Visit Plan flexion ROM, quadriceps endurance, CKC to tolerance of R knee- knee flx mobs if not at 110   PT Home Exercise Plan heel slides-active & passive, SLR, hip abduction, HS stretch with strap, ice 2/day 10 min with leg extended. ; seated HSS; thomas test stretch   Consulted and Agree with Plan of Care Patient      Patient will benefit from skilled therapeutic intervention in order to improve the following deficits and impairments:  Abnormal gait, Decreased range of motion, Difficulty walking, Increased muscle spasms, Decreased activity tolerance, Pain, Improper body mechanics, Impaired flexibility, Decreased balance, Decreased strength, Decreased mobility, Increased edema, Postural dysfunction  Visit Diagnosis: Acute pain of left knee  Stiffness of left knee, not elsewhere classified  Muscle weakness (generalized)     Problem List Patient Active Problem List   Diagnosis Date Noted  . Presence of left artificial knee joint 05/02/2017  . History of total knee arthroplasty 03/27/2017  . Arthritis of knee 03/20/2017  . Chronic pain of right knee 01/10/2017  . Primary osteoarthritis of right knee 01/10/2017  . Pain in right foot 01/10/2017  . Primary osteoarthritis of left knee 01/10/2017  . Hyperlipidemia 11/09/2015  . Essential hypertension   . Chest pain 10/24/2015  . History of non-ST elevation myocardial infarction (NSTEMI) 10/24/2015   . Diarrhea 10/24/2015  . Elevated troponin 10/24/2015  . Hypothyroidism 10/24/2015  . Stroke ([place])   . Hypertensive urgency   . GERD (gastroesophageal reflux disease)   . Gastroesophageal reflux disease without esophagitis     Chassity Ludke C. Lanie Schelling PT, DPT 05/28/17 10:59 AM   [facility] [address] [place], [place], [address] Phone: [phone]   Fax:  [phone]  Name: Mary Dalton MRN: [id number] Date of Birth: 13-Dec-1955

## 2017-05-30 ENCOUNTER — Ambulatory Visit: Payer: BLUE CROSS/BLUE SHIELD | Admitting: Physical Therapy

## 2017-05-30 DIAGNOSIS — L309 Dermatitis, unspecified: Secondary | ICD-10-CM | POA: Diagnosis not present

## 2017-05-30 DIAGNOSIS — E785 Hyperlipidemia, unspecified: Secondary | ICD-10-CM | POA: Diagnosis not present

## 2017-05-30 DIAGNOSIS — I2510 Atherosclerotic heart disease of native coronary artery without angina pectoris: Secondary | ICD-10-CM | POA: Diagnosis not present

## 2017-05-30 DIAGNOSIS — E119 Type 2 diabetes mellitus without complications: Secondary | ICD-10-CM | POA: Diagnosis not present

## 2017-05-30 DIAGNOSIS — Z0000 Encounter for general adult medical examination without abnormal findings: Secondary | ICD-10-CM | POA: Diagnosis not present

## 2017-05-31 ENCOUNTER — Other Ambulatory Visit: Payer: Self-pay | Admitting: Internal Medicine

## 2017-05-31 DIAGNOSIS — Z1231 Encounter for screening mammogram for malignant neoplasm of breast: Secondary | ICD-10-CM

## 2017-06-04 ENCOUNTER — Ambulatory Visit
Admission: RE | Admit: 2017-06-04 | Discharge: 2017-06-04 | Disposition: A | Discharge status: Home / Self Care | Payer: BLUE CROSS/BLUE SHIELD | Source: Ambulatory Visit | Attending: Internal Medicine | Admitting: Internal Medicine

## 2017-06-04 ENCOUNTER — Encounter: Payer: Self-pay | Admitting: Physical Therapy

## 2017-06-04 ENCOUNTER — Ambulatory Visit: Payer: BLUE CROSS/BLUE SHIELD | Attending: Orthopedic Surgery | Admitting: Physical Therapy

## 2017-06-04 DIAGNOSIS — M6281 Muscle weakness (generalized): Secondary | ICD-10-CM

## 2017-06-04 DIAGNOSIS — Z1231 Encounter for screening mammogram for malignant neoplasm of breast: Secondary | ICD-10-CM

## 2017-06-04 DIAGNOSIS — M25562 Pain in left knee: Secondary | ICD-10-CM | POA: Diagnosis not present

## 2017-06-04 DIAGNOSIS — M25662 Stiffness of left knee, not elsewhere classified: Secondary | ICD-10-CM | POA: Insufficient documentation

## 2017-06-04 NOTE — Therapy (Signed)
[place], [place], [address] Phone: [phone]   Fax:  [phone]  Physical Therapy Treatment  Patient Details  Name: Mary Dalton MRN: [id number] Date of Birth: 09-04-1956 Referring Provider: Meredith Pel, MD  Encounter Date: 06/04/2017      PT End of Session - 06/04/17 1017    Visit Number 7   Number of Visits 9   Date for PT Re-Evaluation 06/08/17   Authorization Type BCBS   PT Start Time 1017   PT Stop Time 1055   PT Time Calculation (min) 38 min   Activity Tolerance Patient tolerated treatment well   Behavior During Therapy [facility] for tasks assessed/performed      Past Medical History:  Diagnosis Date  . Arthritis    "knees" (03/20/2017)  . CAD (coronary artery disease), native coronary artery 10/2015   diffuse disease, but non obstructive  . GERD (gastroesophageal reflux disease)   . Gout   . Hypertension   . Hypothyroidism   . Metabolic syndrome   . Stroke [facility])    "dx'd in 10/2015 when I went in for my cardiac cath" (03/20/2017)    Past Surgical History:  Procedure Laterality Date  . CARDIAC CATHETERIZATION N/A 10/26/2015   Procedure: Left Heart Cath and Coronary Angiography;  Surgeon: Jettie Booze, MD;  Location: [facility];  Service: Cardiovascular;  Laterality: N/A;  . JOINT REPLACEMENT    . LAPAROSCOPIC CHOLECYSTECTOMY    . SHOULDER ARTHROSCOPY WITH OPEN ROTATOR CUFF REPAIR Bilateral   . TOTAL KNEE ARTHROPLASTY Left 03/20/2017  . TOTAL KNEE ARTHROPLASTY Left 03/20/2017   Procedure: TOTAL KNEE ARTHROPLASTY;  Surgeon: Meredith Pel, MD;  Location: [place];  Service: Orthopedics;  Laterality: Left;  . TUBAL LIGATION      There were no vitals filed for this visit.      Subjective Assessment - 06/04/17 1017    Subjective R knee has been giving me a fit   Currently in Pain? No/denies            [facility] PT Assessment - 06/04/17 0001      PROM   Left Knee Flexion 110                     [facility] Adult PT Treatment/Exercise - 06/04/17 0001      Knee/Hip Exercises: Stretches   Passive Hamstring Stretch Both;2 reps;30 seconds   Passive Hamstring Stretch Limitations supine with green strap   Knee: Self-Stretch Limitations heel slides green strap 2 min 10s holds     Knee/Hip Exercises: Supine   Short Arc Quad Sets 10 reps  5s holds   Short Arc Quad Sets Limitations 2# ankle weight   Heel Slides Limitations 2 min in DF   Bridges Limitations x20 legs over bolster   Straight Leg Raises 15 reps;2 sets   Straight Leg Raises Limitations SAQ to SLR; with abd/add   Straight Leg Raise with External Rotation 15 reps     Knee/Hip Exercises: Sidelying   Hip ABduction Both;20 reps                  PT Short Term Goals - 05/10/17 1207      PT SHORT TERM GOAL #1   Title PROM 0-120   Baseline 0-89 at eval   Time 4   Period Weeks   Status New     PT SHORT TERM GOAL #2   Title Pt will  be able to ambulate around her home without limitation presented by L knee    Baseline tightness and discomfort noted laterally  (pt is significantly limited by R knee)   Time 4   Period Weeks   Status New     PT SHORT TERM GOAL #3   Title hip MMT grossly 4+/5 to indicate appropraite support to joint by surrounding soft tissue   Baseline see flowsheet   Time 4   Period Weeks   Status New     PT SHORT TERM GOAL #4   Title Pt will be able to utilize L knee for primary support while navigating steps into and out of her home   Baseline severe difficulty at eval   Time 4   Period Weeks   Status New                  Plan - 06/04/17 1037    Clinical Impression Statement Pt cont to be significantly limited by R knee. Exercises increased to challenge endurance of L LE in [place] to avoid increasing R knee pain. Will d/c at next visit to independent HEP for L knee.    PT Treatment/Interventions ADLs/Self Care Home  Management;Cryotherapy;Electrical Stimulation;Iontophoresis 4mg /ml Dexamethasone;Functional mobility training;Stair training;Gait training;Ultrasound;Moist Heat;Therapeutic activities;Therapeutic exercise;Balance training;Neuromuscular re-education;Patient/family education;Passive range of motion;Scar mobilization;Manual techniques;Taping;Dry needling   PT Next Visit Plan d/c   PT Home Exercise Plan heel slides-active & passive, SLR, hip abduction, HS stretch with strap, ice 2/day 10 min with leg extended. ; seated HSS; thomas test stretch   Consulted and Agree with Plan of Care Patient      Patient will benefit from skilled therapeutic intervention in order to improve the following deficits and impairments:  Abnormal gait, Decreased range of motion, Difficulty walking, Increased muscle spasms, Decreased activity tolerance, Pain, Improper body mechanics, Impaired flexibility, Decreased balance, Decreased strength, Decreased mobility, Increased edema, Postural dysfunction  Visit Diagnosis: Acute pain of left knee  Stiffness of left knee, not elsewhere classified  Muscle weakness (generalized)     Problem List Patient Active Problem List   Diagnosis Date Noted  . Presence of left artificial knee joint 05/02/2017  . History of total knee arthroplasty 03/27/2017  . Arthritis of knee 03/20/2017  . Chronic pain of right knee 01/10/2017  . Primary osteoarthritis of right knee 01/10/2017  . Pain in right foot 01/10/2017  . Primary osteoarthritis of left knee 01/10/2017  . Hyperlipidemia 11/09/2015  . Essential hypertension   . Chest pain 10/24/2015  . History of non-ST elevation myocardial infarction (NSTEMI) 10/24/2015  . Diarrhea 10/24/2015  . Elevated troponin 10/24/2015  . Hypothyroidism 10/24/2015  . Stroke ([place])   . Hypertensive urgency   . GERD (gastroesophageal reflux disease)   . Gastroesophageal reflux disease without esophagitis    Corrinne Benegas C. Nile Dorning PT, DPT 06/04/17  10:55 AM   [facility] [address] [place], [place], [address] Phone: [phone]   Fax:  [phone]  Name: KAYTIE RATCLIFFE MRN: [id number] Date of Birth: December 09, 1955

## 2017-06-05 ENCOUNTER — Other Ambulatory Visit: Payer: Self-pay | Admitting: Internal Medicine

## 2017-06-05 DIAGNOSIS — R928 Other abnormal and inconclusive findings on diagnostic imaging of breast: Secondary | ICD-10-CM

## 2017-06-07 ENCOUNTER — Encounter: Payer: Self-pay | Admitting: Physical Therapy

## 2017-06-07 ENCOUNTER — Ambulatory Visit: Payer: BLUE CROSS/BLUE SHIELD | Admitting: Physical Therapy

## 2017-06-07 DIAGNOSIS — M6281 Muscle weakness (generalized): Secondary | ICD-10-CM | POA: Diagnosis not present

## 2017-06-07 DIAGNOSIS — M25662 Stiffness of left knee, not elsewhere classified: Secondary | ICD-10-CM

## 2017-06-07 DIAGNOSIS — M25562 Pain in left knee: Secondary | ICD-10-CM

## 2017-06-07 NOTE — Therapy (Signed)
[place], [place], [address] Phone: [phone]   Fax:  [phone]  Physical Therapy Treatment/Discharge Summary  Patient Details  Name: Mary Dalton MRN: [id number] Date of Birth: 1955-12-25 Referring Provider: Meredith Pel, MD  Encounter Date: 06/07/2017      PT End of Session - 06/07/17 1147    Visit Number 8   Number of Visits 9   Date for PT Re-Evaluation 06/08/17   Authorization Type BCBS   PT Start Time 1145   PT Stop Time 1220   PT Time Calculation (min) 35 min   Activity Tolerance Patient tolerated treatment well   Behavior During Therapy [facility] for tasks assessed/performed      Past Medical History:  Diagnosis Date  . Arthritis    "knees" (03/20/2017)  . CAD (coronary artery disease), native coronary artery 10/2015   diffuse disease, but non obstructive  . GERD (gastroesophageal reflux disease)   . Gout   . Hypertension   . Hypothyroidism   . Metabolic syndrome   . Stroke [facility])    "dx'd in 10/2015 when I went in for my cardiac cath" (03/20/2017)    Past Surgical History:  Procedure Laterality Date  . CARDIAC CATHETERIZATION N/A 10/26/2015   Procedure: Left Heart Cath and Coronary Angiography;  Surgeon: Jettie Booze, MD;  Location: [place] [facility];  Service: Cardiovascular;  Laterality: N/A;  . JOINT REPLACEMENT    . LAPAROSCOPIC CHOLECYSTECTOMY    . SHOULDER ARTHROSCOPY WITH OPEN ROTATOR CUFF REPAIR Bilateral   . TOTAL KNEE ARTHROPLASTY Left 03/20/2017  . TOTAL KNEE ARTHROPLASTY Left 03/20/2017   Procedure: TOTAL KNEE ARTHROPLASTY;  Surgeon: Meredith Pel, MD;  Location: [place];  Service: Orthopedics;  Laterality: Left;  . TUBAL LIGATION      There were no vitals filed for this visit.      Subjective Assessment - 06/07/17 1146    Subjective L knee feels fabulous   Currently in Pain? No/denies            [facility] PT Assessment - 06/07/17 0001      Observation/Other Assessments   Focus on Therapeutic Outcomes (FOTO)  90% ability                     [facility] Adult PT Treatment/Exercise - 06/07/17 0001      Knee/Hip Exercises: Stretches   Passive Hamstring Stretch Limitations supine with green strap   Knee: Self-Stretch Limitations heel slides green strap 2 min 10s holds     Knee/Hip Exercises: Seated   Other Seated Knee/Hip Exercises seated coreactivations, straight plan and rotations     Knee/Hip Exercises: Supine   Bridges Limitations x20 legs over bolster   Straight Leg Raises 15 reps   Straight Leg Raises Limitations SAQ to SLR from bolster   Straight Leg Raise with External Rotation 15 reps   Other Supine Knee/Hip Exercises heel slides x15     Knee/Hip Exercises: Sidelying   Hip ABduction Both;20 reps                PT Education - 06/07/17 1234    Education provided Yes   Education Details progress with goals, long term HEP, exercise form/rationale   Person(s) Educated Patient   Methods Explanation;Verbal cues   Comprehension Verbalized understanding;Need further instruction          PT Short Term Goals - 06/07/17 1147      PT SHORT TERM GOAL #  1   Title PROM 0-120   Baseline 0-115   Status Partially Met     PT SHORT TERM GOAL #2   Title Pt will be able to ambulate around her home without limitation presented by L knee    Baseline not limited by L   Status Achieved     PT SHORT TERM GOAL #3   Title hip MMT grossly 4+/5 to indicate appropraite support to joint by surrounding soft tissue   Baseline grossly 5/5 on L   Status Achieved     PT SHORT TERM GOAL #4   Title Pt will be able to utilize L knee for primary support while navigating steps into and out of her home   Baseline able to use L knee   Status Achieved                  Plan - 06/07/17 1232    Clinical Impression Statement Pt has met all goals a this time and is being d/c to independent program. Verbalized  comfort and understanding with continued HEP and prep for R TKA. Pt was instructed to contact us with any further questions.    PT Treatment/Interventions ADLs/Self Care Home Management;Cryotherapy;Electrical Stimulation;Iontophoresis 53m/ml Dexamethasone;Functional mobility training;Stair training;Gait training;Ultrasound;Moist Heat;Therapeutic activities;Therapeutic exercise;Balance training;Neuromuscular re-education;Patient/family education;Passive range of motion;Scar mobilization;Manual techniques;Taping;Dry needling   PT Home Exercise Plan heel slides-active & passive, SLR, hip abduction, HS stretch with strap, ice 2/day 10 min with leg extended. ; seated HSS; thomas test stretch   Consulted and Agree with Plan of Care Patient      Patient will benefit from skilled therapeutic intervention in order to improve the following deficits and impairments:  Abnormal gait, Decreased range of motion, Difficulty walking, Increased muscle spasms, Decreased activity tolerance, Pain, Improper body mechanics, Impaired flexibility, Decreased balance, Decreased strength, Decreased mobility, Increased edema, Postural dysfunction  Visit Diagnosis: Acute pain of left knee  Stiffness of left knee, not elsewhere classified  Muscle weakness (generalized)     Problem List Patient Active Problem List   Diagnosis Date Noted  . Presence of left artificial knee joint 05/02/2017  . History of total knee arthroplasty 03/27/2017  . Arthritis of knee 03/20/2017  . Chronic pain of right knee 01/10/2017  . Primary osteoarthritis of right knee 01/10/2017  . Pain in right foot 01/10/2017  . Primary osteoarthritis of left knee 01/10/2017  . Hyperlipidemia 11/09/2015  . Essential hypertension   . Chest pain 10/24/2015  . History of non-ST elevation myocardial infarction (NSTEMI) 10/24/2015  . Diarrhea 10/24/2015  . Elevated troponin 10/24/2015  . Hypothyroidism 10/24/2015  . [place]   . Hypertensive  urgency   . GERD (gastroesophageal reflux disease)   . Gastroesophageal reflux disease without esophagitis    PHYSICAL THERAPY DISCHARGE SUMMARY  Visits from Start of Care: 8  Current functional level related to goals / functional outcomes: See above   Remaining deficits: See above   Education / Equipment: Anatomy of condition,  POC, HEP, exercise form/rationale Plan: Patient agrees to discharge.  Patient goals were met. Patient is being discharged due to meeting the stated rehab goals.  ?????     Sirinity Outland C. Cuthbert Turton PT, DPT 06/07/17 12:36 PM   [facility] [address]Phone: [phone]  Fax:  [phone] Name: AONIA SHIFLETTMRN: [id number] of Birth: 501/16/1957

## 2017-06-08 ENCOUNTER — Ambulatory Visit: Payer: BLUE CROSS/BLUE SHIELD

## 2017-06-08 ENCOUNTER — Ambulatory Visit
Admission: RE | Admit: 2017-06-08 | Discharge: 2017-06-08 | Disposition: A | Discharge status: Home / Self Care | Payer: BLUE CROSS/BLUE SHIELD | Source: Ambulatory Visit | Attending: Internal Medicine | Admitting: Internal Medicine

## 2017-06-08 DIAGNOSIS — R928 Other abnormal and inconclusive findings on diagnostic imaging of breast: Secondary | ICD-10-CM | POA: Diagnosis not present

## 2017-06-15 ENCOUNTER — Ambulatory Visit (INDEPENDENT_AMBULATORY_CARE_PROVIDER_SITE_OTHER): Payer: BLUE CROSS/BLUE SHIELD | Admitting: Orthopedic Surgery

## 2017-06-15 ENCOUNTER — Encounter (INDEPENDENT_AMBULATORY_CARE_PROVIDER_SITE_OTHER): Payer: Self-pay | Admitting: Orthopedic Surgery

## 2017-06-15 DIAGNOSIS — Z96652 Presence of left artificial knee joint: Secondary | ICD-10-CM

## 2017-06-17 NOTE — Progress Notes (Signed)
Post-Op Visit Note   Patient: Mary Dalton           Date of Birth: 05/02/1956           MRN: [id number] Visit Date: 06/15/2017 PCP: Velna Hatchet, MD   Assessment & Plan:  Chief Complaint:  Chief Complaint  Patient presents with  . Left Knee - Routine Post Op   Visit Diagnoses:  1. Presence of left artificial knee joint     Plan: Mary Dalton is a 61 year old female who is now 3 months out left total knee replacement.  She's doing well.  She wants to get her right knee replaced.  Had physical therapy for the last time last week.  She is on a home exercise program currently.  Right knee hurts the same as the left knee.  She does have arthritis in this knee.  Risks and benefits of surgical intervention for the right knee discussed.  There included but not limited to infection or vessel damage knee stiffness T cane thrombosis as well as prolonged recovery.  She's doing quite well with her left knee.  All questions answered.  Follow-Up Instructions: No Follow-up on file.   Orders:  No orders of the defined types were placed in this encounter.  No orders of the defined types were placed in this encounter.   Imaging: No results found.  PMFS History: Patient Active Problem List   Diagnosis Date Noted  . Presence of left artificial knee joint 05/02/2017  . History of total knee arthroplasty 03/27/2017  . Arthritis of knee 03/20/2017  . Chronic pain of right knee 01/10/2017  . Primary osteoarthritis of right knee 01/10/2017  . Pain in right foot 01/10/2017  . Primary osteoarthritis of left knee 01/10/2017  . Hyperlipidemia 11/09/2015  . Essential hypertension   . Chest pain 10/24/2015  . History of non-ST elevation myocardial infarction (NSTEMI) 10/24/2015  . Diarrhea 10/24/2015  . Elevated troponin 10/24/2015  . Hypothyroidism 10/24/2015  . Stroke ([place])   . Hypertensive urgency   . GERD (gastroesophageal reflux disease)   . Gastroesophageal reflux disease without  esophagitis    Past Medical History:  Diagnosis Date  . Arthritis    "knees" (03/20/2017)  . CAD (coronary artery disease), native coronary artery 10/2015   diffuse disease, but non obstructive  . GERD (gastroesophageal reflux disease)   . Gout   . Hypertension   . Hypothyroidism   . Metabolic syndrome   . Stroke [facility])    "dx'd in 10/2015 when I went in for my cardiac cath" (03/20/2017)    Family History  Problem Relation Age of Onset  . Healthy Mother   . Diabetes Mother   . Healthy Father   . Healthy Brother   . Healthy Brother   . Healthy Brother     Past Surgical History:  Procedure Laterality Date  . CARDIAC CATHETERIZATION N/A 10/26/2015   Procedure: Left Heart Cath and Coronary Angiography;  Surgeon: Jettie Booze, MD;  Location: [place];  Service: Cardiovascular;  Laterality: N/A;  . JOINT REPLACEMENT    . LAPAROSCOPIC CHOLECYSTECTOMY    . SHOULDER ARTHROSCOPY WITH OPEN ROTATOR CUFF REPAIR Bilateral   . TOTAL KNEE ARTHROPLASTY Left 03/20/2017  . TOTAL KNEE ARTHROPLASTY Left 03/20/2017   Procedure: TOTAL KNEE ARTHROPLASTY;  Surgeon: Meredith Pel, MD;  Location: [place];  Service: Orthopedics;  Laterality: Left;  . TUBAL LIGATION     Social History   Occupational History  . Not on  file.   Social History Main Topics  . Smoking status: Never Smoker  . Smokeless tobacco: Never Used  . Alcohol use No  . Drug use: No  . Sexual activity: No

## 2017-06-18 ENCOUNTER — Encounter (INDEPENDENT_AMBULATORY_CARE_PROVIDER_SITE_OTHER): Payer: Self-pay | Admitting: Orthopedic Surgery

## 2017-06-18 ENCOUNTER — Other Ambulatory Visit (INDEPENDENT_AMBULATORY_CARE_PROVIDER_SITE_OTHER): Payer: Self-pay | Admitting: Orthopedic Surgery

## 2017-06-18 DIAGNOSIS — M1711 Unilateral primary osteoarthritis, right knee: Secondary | ICD-10-CM

## 2017-06-20 ENCOUNTER — Other Ambulatory Visit (INDEPENDENT_AMBULATORY_CARE_PROVIDER_SITE_OTHER): Payer: Self-pay | Admitting: Orthopedic Surgery

## 2017-08-01 ENCOUNTER — Other Ambulatory Visit (HOSPITAL_COMMUNITY): Payer: Self-pay | Admitting: *Deleted

## 2017-08-01 NOTE — Pre-Procedure Instructions (Signed)
Mary Dalton  08/01/2017    Your procedure is scheduled on Wednesday, August 14, 2017 at 7:30 AM.   Report to [facility] Entrance "A" Admitting Office at 5:30 AM.   Call this number if you have problems the morning of surgery: [phone]   Questions prior to day of surgery, please call [phone] between 8 & 4 PM.   Remember:  Do not eat food or drink liquids after midnight Tuesday, 08/13/17.  Take these medicines the morning of surgery with A SIP OF WATER: Levothyroxine (Synthroid), Metoprolol (Lopressor)  Stop Xarelto and Aspirin as instructed by cardiologist/surgeon. Do not use NSAIDS (Ibuprofen, Aleve, etc.) 5 days prior to surgery.   Do not wear jewelry, make-up or nail polish.  Do not wear lotions, powders, perfumes or deodorant.  Do not shave 48 hours prior to surgery.    Do not bring valuables to the hospital.  [facility] is not responsible for any belongings or valuables.  Contacts, dentures or bridgework may not be worn into surgery.  Leave your suitcase in the car.  After surgery it may be brought to your room.  For patients admitted to the hospital, discharge time will be determined by your treatment team.  [facility] - Preparing for Surgery  Before surgery, you can play an important role.  Because skin is not sterile, your skin needs to be as free of germs as possible.  You can reduce the number of germs on you skin by washing with CHG (chlorahexidine gluconate) soap before surgery.  CHG is an antiseptic cleaner which kills germs and bonds with the skin to continue killing germs even after washing.  Please DO NOT use if you have an allergy to CHG or antibacterial soaps.  If your skin becomes reddened/irritated stop using the CHG and inform your nurse when you arrive at Short Stay.  Do not shave (including legs and underarms) for at least 48 hours prior to the first CHG shower.  You may shave your face.  Please follow these instructions  carefully:   1.  Shower with CHG Soap the night before surgery and the                    morning of Surgery.  2.  If you choose to wash your hair, wash your hair first as usual with your       normal shampoo.  3.  After you shampoo, rinse your hair and body thoroughly to remove the shampoo.  4.  Use CHG as you would any other liquid soap.  You can apply chg directly       to the skin and wash gently with scrungie or a clean washcloth.  5.  Apply the CHG Soap to your body ONLY FROM THE NECK DOWN.        Do not use on open wounds or open sores.  Avoid contact with your eyes, ears, mouth and genitals (private parts).  Wash genitals (private parts) with your normal soap.  6.  Wash thoroughly, paying special attention to the area where your surgery        will be performed.  7.  Thoroughly rinse your body with warm water from the neck down.  8.  DO NOT shower/wash with your normal soap after using and rinsing off       the CHG Soap.  9.  Pat yourself dry with a clean towel.  10.  Wear clean pajamas.            11.  Place clean sheets on your bed the night of your first shower and do not        sleep with pets.  Day of Surgery  Do not apply any lotions/deodorants the morning of surgery.  Please wear clean clothes to the hospital.    Please read over the fact sheets that you were given.

## 2017-08-02 ENCOUNTER — Encounter (HOSPITAL_COMMUNITY)
Admission: RE | Admit: 2017-08-02 | Discharge: 2017-08-02 | Disposition: A | Discharge status: Home / Self Care | Payer: BLUE CROSS/BLUE SHIELD | Source: Ambulatory Visit | Attending: Orthopedic Surgery | Admitting: Orthopedic Surgery

## 2017-08-02 ENCOUNTER — Encounter (HOSPITAL_COMMUNITY): Payer: Self-pay

## 2017-08-02 DIAGNOSIS — Z01818 Encounter for other preprocedural examination: Secondary | ICD-10-CM | POA: Diagnosis not present

## 2017-08-02 DIAGNOSIS — M1711 Unilateral primary osteoarthritis, right knee: Secondary | ICD-10-CM | POA: Diagnosis not present

## 2017-08-02 LAB — URINALYSIS, ROUTINE W REFLEX MICROSCOPIC
BACTERIA UA: NONE SEEN
BILIRUBIN URINE: NEGATIVE
Glucose, UA: NEGATIVE mg/dL
KETONES UR: NEGATIVE mg/dL
LEUKOCYTES UA: NEGATIVE
NITRITE: NEGATIVE
PH: 5 (ref 5.0–8.0)
PROTEIN: NEGATIVE mg/dL
Specific Gravity, Urine: 1.015 (ref 1.005–1.030)

## 2017-08-02 LAB — SURGICAL PCR SCREEN
MRSA, PCR: NEGATIVE
Staphylococcus aureus: NEGATIVE

## 2017-08-02 LAB — CBC
HCT: 40.1 % (ref 36.0–46.0)
HEMOGLOBIN: 13.2 g/dL (ref 12.0–15.0)
MCH: 30.8 pg (ref 26.0–34.0)
MCHC: 32.9 g/dL (ref 30.0–36.0)
MCV: 93.7 fL (ref 78.0–100.0)
Platelets: 357 10*3/uL (ref 150–400)
RBC: 4.28 MIL/uL (ref 3.87–5.11)
RDW: 14.1 % (ref 11.5–15.5)
WBC: 7.3 10*3/uL (ref 4.0–10.5)

## 2017-08-02 LAB — BASIC METABOLIC PANEL
Anion gap: 8 (ref 5–15)
BUN: 13 mg/dL (ref 6–20)
CHLORIDE: 106 mmol/L (ref 101–111)
CO2: 25 mmol/L (ref 22–32)
CREATININE: 0.72 mg/dL (ref 0.44–1.00)
Calcium: 9.1 mg/dL (ref 8.9–10.3)
GFR calc Af Amer: >60 mL/min (ref >60)
GFR calc non Af Amer: >60 mL/min (ref >60)
Glucose, Bld: 98 mg/dL (ref 65–99)
Potassium: 3.8 mmol/L (ref 3.5–5.1)
Sodium: 139 mmol/L (ref 135–145)

## 2017-08-02 NOTE — Progress Notes (Signed)
Pt has hx of non-obstructive heart disease. Cardiologist is Dr. Linard Millers. Last OV was February, 2018 where they gave clearance for her knee surgery in April. Pt denies any recent chest pain or sob. Pt states she has stopped her Xarelto in July per Dr. Marlou Sa. It was still on her med list and pharmacy tech had completed it. She feels sure that Xarelto is what was stopped. I told her to check when she gets home and if she is still taking it, she needs to call Dr. Randel Pigg office and ask when she needs to stop it prior to surgery. She was put on it as a preventive after last knee surgery. She states she did not have a clot or PE after surgery.

## 2017-08-02 NOTE — Progress Notes (Signed)
Anesthesia Chart Review:  Pt is a 61 year old female scheduled for R total knee arthroplasty on 08/14/2017 with Meredith Pel, MD  - PCP is Velna Hatchet, MD - Cardiologist is Daneen Schick, MD; last office visit 01/30/17 with Lyda Jester, PA.  PMH includes:  CAD (mild to moderate diffuse nonobstructive 10/26/15 cath), stroke, HTN, metabolic syndrome, hypothyroidism, GERD.  Never smoker. BMI 38.5. S/p L TKA 03/20/17.   Medications include: ASA 81 mg, HCTZ, levothyroxine, metoprolol  Preoperative labs reviewed.    CXR 03/12/17: No active cardiopulmonary disease.  EKG 01/30/17: NSR. Possible LAE. Septal infarct, age undetermined.  Cardiac cath 10/26/15:   Mild to moderate diffuse coronary artery disease without focal significant stenosis.  Due to the severe tortuosity in the right shoulder, I would not recommend using the right radial artery for cardiac catheterization if this was needed in the future.  LVEDP 16 mmHg. - Plan medical therapy with aggressive blood pressure control. She should continue with aggressive preventative therapy for coronary artery disease given the diffuse coronary plaquing that she has.  Echo 10/25/15:  - Left ventricle: The cavity size was normal. Wall thickness was increased in a pattern of mild LVH. Systolic function was normal. The estimated ejection fraction was in the range of 60% to 65%. Wall motion was normal; there were no regional wall motionabnormalities. Doppler parameters are consistent with abnormalleft ventricular relaxation (grade 1 diastolic dysfunction). - Aortic valve: There was no stenosis. - Mitral valve: Mildly calcified annulus. There was no significant regurgitation. - Right ventricle: The cavity size was normal. Systolic function was normal. - Pulmonary arteries: No complete TR doppler jet so unable to estimate PA systolic pressure. - Inferior vena cava: The vessel was normal in size. The respirophasic diameter changes  were in the normal range (>= 50%), consistent with normal central venous pressure.  Pt tolerated L TKA last April without issue, now for R TKA. If no changes, I anticipate pt can proceed with surgery as scheduled.   Nomi Rudnicki, FNP-BC [facility] Short Stay Surgical Center/Anesthesiology Phone: [phone] 08/02/2017 2:56 PM

## 2017-08-03 LAB — URINE CULTURE

## 2017-08-12 NOTE — H&P (Signed)
TOTAL KNEE ADMISSION H&P  Patient is being admitted for right total knee arthroplasty.  Subjective:  Chief Complaint:right knee pain.  HPI: Mary Dalton, 61 y.o. female, has a history of pain and functional disability in the right knee due to arthritis and has failed non-surgical conservative treatments for greater than 12 weeks to includeNSAID's and/or analgesics, corticosteriod injections, use of assistive devices and activity modification.  Onset of symptoms was gradual, starting 8 years ago with gradually worsening course since that time. The patient noted no past surgery on the right knee(s).  Patient currently rates pain in the right knee(s) at 9 out of 10 with activity. Patient has night pain, worsening of pain with activity and weight bearing, pain that interferes with activities of daily living, pain with passive range of motion, crepitus and joint swelling.  Patient has evidence of subchondral sclerosis, periarticular osteophytes, joint subluxation and joint space narrowing by imaging studies. This patient has had Very good result with her left total knee replacement done earlier this year. There is no active infection.  Patient Active Problem List   Diagnosis Date Noted  . Presence of left artificial knee joint 05/02/2017  . History of total knee arthroplasty 03/27/2017  . Arthritis of knee 03/20/2017  . Chronic pain of right knee 01/10/2017  . Primary osteoarthritis of right knee 01/10/2017  . Pain in right foot 01/10/2017  . Primary osteoarthritis of left knee 01/10/2017  . Hyperlipidemia 11/09/2015  . Essential hypertension   . Chest pain 10/24/2015  . History of non-ST elevation myocardial infarction (NSTEMI) 10/24/2015  . Diarrhea 10/24/2015  . Elevated troponin 10/24/2015  . Hypothyroidism 10/24/2015  . Stroke ([place])   . Hypertensive urgency   . GERD (gastroesophageal reflux disease)   . Gastroesophageal reflux disease without esophagitis    Past Medical History:   Diagnosis Date  . Arthritis    "knees" (03/20/2017)  . CAD (coronary artery disease), native coronary artery 10/2015   diffuse disease, but non obstructive  . GERD (gastroesophageal reflux disease)   . Gout   . Hypertension   . Hypothyroidism   . Metabolic syndrome   . Stroke [facility])    "dx'd in 10/2015 when I went in for my cardiac cath" (03/20/2017)    Past Surgical History:  Procedure Laterality Date  . CARDIAC CATHETERIZATION N/A 10/26/2015   Procedure: Left Heart Cath and Coronary Angiography;  Surgeon: Jettie Booze, MD;  Location: [facility];  Service: Cardiovascular;  Laterality: N/A;  . JOINT REPLACEMENT    . LAPAROSCOPIC CHOLECYSTECTOMY    . SHOULDER ARTHROSCOPY WITH OPEN ROTATOR CUFF REPAIR Bilateral   . TOTAL KNEE ARTHROPLASTY Left 03/20/2017  . TOTAL KNEE ARTHROPLASTY Left 03/20/2017   Procedure: TOTAL KNEE ARTHROPLASTY;  Surgeon: Meredith Pel, MD;  Location: [facility];  Service: Orthopedics;  Laterality: Left;  . TUBAL LIGATION      No prescriptions prior to admission.   Allergies  Allergen Reactions  . No Known Allergies     Social History  Substance Use Topics  . Smoking status: Never Smoker  . Smokeless tobacco: Never Used  . Alcohol use No    Family History  Problem Relation Age of Onset  . Healthy Mother   . Diabetes Mother   . Healthy Father   . Healthy Brother   . Healthy Brother   . Healthy Brother      Review of Systems  Musculoskeletal: Positive for joint pain.  All other systems reviewed and are negative.  Objective:  Physical Exam  Constitutional: She appears well-developed.  HENT:  Head: Normocephalic.  Eyes: Pupils are equal, round, and reactive to light.  Neck: Normal range of motion.  Cardiovascular: Normal rate.   Respiratory: Effort normal.  Neurological: She is alert.  Skin: Skin is warm.  Psychiatric: She has a normal mood and affect.  Examination of the right knee demonstrates mild varus alignment.   Palpable pedal pulses.  Range of motion about 5 to slightly past 90.  Collaterals are stable.  Extensor mechanism is intact.  No groin pain with internal/external rotation of the leg.  Vital signs in last 24 hours:    Labs:   Estimated body mass index is 38.47 kg/m as calculated from the following:   Height as of 08/02/17: 5\' 6"  (1.676 m).   Weight as of 08/02/17: 238 lb 6 oz (108.1 kg).   Imaging Review Plain radiographs demonstrate severe degenerative joint disease of the right knee(s). The overall alignment ismild varus. The bone quality appears to be good for age and reported activity level.  Assessment/Plan:  End stage arthritis, right knee   The patient history, physical examination, clinical judgment of the provider and imaging studies are consistent with end stage degenerative joint disease of the right knee(s) and total knee arthroplasty is deemed medically necessary. The treatment options including medical management, injection therapy arthroscopy and arthroplasty were discussed at length. The risks and benefits of total knee arthroplasty were presented and reviewed. The risks due to aseptic loosening, infection, stiffness, patella tracking problems, thromboembolic complications and other imponderables were discussed. The patient acknowledged the explanation, agreed to proceed with the plan and consent was signed. Patient is being admitted for inpatient treatment for surgery, pain control, PT, OT, prophylactic antibiotics, VTE prophylaxis, progressive ambulation and ADL's and discharge planning. The patient is planning to be discharged home with home health services

## 2017-08-13 NOTE — Anesthesia Preprocedure Evaluation (Addendum)
Anesthesia Evaluation  Patient identified by MRN, date of birth, ID band Patient awake    Reviewed: Allergy & Precautions, NPO status , Patient's Chart, lab work & pertinent test results  Airway Mallampati: II  TM Distance: >3 FB Neck ROM: Full    Dental  (+) Missing, Poor Dentition, Dental Advisory Given   Pulmonary neg pulmonary ROS,    breath sounds clear to auscultation       Cardiovascular hypertension, + CAD   Rhythm:Regular Rate:Normal     Neuro/Psych CVA    GI/Hepatic GERD  ,  Endo/Other  Hypothyroidism   Renal/GU      Musculoskeletal  (+) Arthritis ,   Abdominal   Peds  Hematology   Anesthesia Other Findings   Reproductive/Obstetrics                           Anesthesia Physical Anesthesia Plan  ASA: III  Anesthesia Plan: General   Post-op Pain Management:  Regional for Post-op pain   Induction: Intravenous  PONV Risk Score and Plan: 4 or greater and Ondansetron, Dexamethasone, Midazolam, Scopolamine patch - Pre-op and Treatment may vary due to age or medical condition  Airway Management Planned: Oral ETT  Additional Equipment:   Intra-op Plan:   Post-operative Plan:   Informed Consent: I have reviewed the patients History and Physical, chart, labs and discussed the procedure including the risks, benefits and alternatives for the proposed anesthesia with the patient or authorized representative who has indicated his/her understanding and acceptance.   Dental advisory given  Plan Discussed with: CRNA  Anesthesia Plan Comments:         Anesthesia Quick Evaluation

## 2017-08-14 ENCOUNTER — Inpatient Hospital Stay (HOSPITAL_COMMUNITY): Payer: BLUE CROSS/BLUE SHIELD | Admitting: Anesthesiology

## 2017-08-14 ENCOUNTER — Inpatient Hospital Stay (HOSPITAL_COMMUNITY)
Admission: RE | Admit: 2017-08-14 | Discharge: 2017-08-16 | DRG: 470 | Disposition: A | Discharge status: Skilled Nursing Facility | Payer: BLUE CROSS/BLUE SHIELD | Attending: Orthopedic Surgery | Admitting: Orthopedic Surgery

## 2017-08-14 ENCOUNTER — Inpatient Hospital Stay (HOSPITAL_COMMUNITY): Payer: BLUE CROSS/BLUE SHIELD | Admitting: Emergency Medicine

## 2017-08-14 ENCOUNTER — Encounter (HOSPITAL_COMMUNITY): Payer: Self-pay | Admitting: Anesthesiology

## 2017-08-14 ENCOUNTER — Encounter (HOSPITAL_COMMUNITY)
Admission: RE | Disposition: A | Discharge status: Skilled Nursing Facility | Payer: Self-pay | Source: Home / Self Care | Attending: Orthopedic Surgery

## 2017-08-14 DIAGNOSIS — G8911 Acute pain due to trauma: Secondary | ICD-10-CM | POA: Diagnosis not present

## 2017-08-14 DIAGNOSIS — S8990XA Unspecified injury of unspecified lower leg, initial encounter: Secondary | ICD-10-CM | POA: Diagnosis not present

## 2017-08-14 DIAGNOSIS — Z96652 Presence of left artificial knee joint: Secondary | ICD-10-CM | POA: Diagnosis present

## 2017-08-14 DIAGNOSIS — M170 Bilateral primary osteoarthritis of knee: Secondary | ICD-10-CM | POA: Diagnosis not present

## 2017-08-14 DIAGNOSIS — I160 Hypertensive urgency: Secondary | ICD-10-CM | POA: Diagnosis not present

## 2017-08-14 DIAGNOSIS — K219 Gastro-esophageal reflux disease without esophagitis: Secondary | ICD-10-CM | POA: Diagnosis not present

## 2017-08-14 DIAGNOSIS — I10 Essential (primary) hypertension: Secondary | ICD-10-CM | POA: Diagnosis present

## 2017-08-14 DIAGNOSIS — G8929 Other chronic pain: Secondary | ICD-10-CM | POA: Diagnosis present

## 2017-08-14 DIAGNOSIS — Z8673 Personal history of transient ischemic attack (TIA), and cerebral infarction without residual deficits: Secondary | ICD-10-CM

## 2017-08-14 DIAGNOSIS — I2510 Atherosclerotic heart disease of native coronary artery without angina pectoris: Secondary | ICD-10-CM | POA: Diagnosis present

## 2017-08-14 DIAGNOSIS — Z96651 Presence of right artificial knee joint: Secondary | ICD-10-CM | POA: Diagnosis not present

## 2017-08-14 DIAGNOSIS — R488 Other symbolic dysfunctions: Secondary | ICD-10-CM | POA: Diagnosis not present

## 2017-08-14 DIAGNOSIS — E785 Hyperlipidemia, unspecified: Secondary | ICD-10-CM | POA: Diagnosis not present

## 2017-08-14 DIAGNOSIS — I252 Old myocardial infarction: Secondary | ICD-10-CM

## 2017-08-14 DIAGNOSIS — E039 Hypothyroidism, unspecified: Secondary | ICD-10-CM | POA: Diagnosis present

## 2017-08-14 DIAGNOSIS — M1710 Unilateral primary osteoarthritis, unspecified knee: Secondary | ICD-10-CM | POA: Diagnosis present

## 2017-08-14 DIAGNOSIS — E8881 Metabolic syndrome: Secondary | ICD-10-CM | POA: Diagnosis not present

## 2017-08-14 DIAGNOSIS — M6281 Muscle weakness (generalized): Secondary | ICD-10-CM | POA: Diagnosis not present

## 2017-08-14 DIAGNOSIS — M25561 Pain in right knee: Secondary | ICD-10-CM | POA: Diagnosis not present

## 2017-08-14 DIAGNOSIS — Z471 Aftercare following joint replacement surgery: Secondary | ICD-10-CM | POA: Diagnosis not present

## 2017-08-14 DIAGNOSIS — G8918 Other acute postprocedural pain: Secondary | ICD-10-CM | POA: Diagnosis not present

## 2017-08-14 DIAGNOSIS — R262 Difficulty in walking, not elsewhere classified: Secondary | ICD-10-CM | POA: Diagnosis not present

## 2017-08-14 DIAGNOSIS — M1711 Unilateral primary osteoarthritis, right knee: Secondary | ICD-10-CM | POA: Diagnosis not present

## 2017-08-14 HISTORY — PX: TOTAL KNEE ARTHROPLASTY: SHX125

## 2017-08-14 HISTORY — DX: Pure hypercholesterolemia, unspecified: E78.00

## 2017-08-14 HISTORY — DX: ST elevation (STEMI) myocardial infarction of unspecified site: I21.3

## 2017-08-14 SURGERY — ARTHROPLASTY, KNEE, TOTAL
Anesthesia: Regional | Site: Knee | Laterality: Right

## 2017-08-14 MED ORDER — CLONIDINE HCL (ANALGESIA) 100 MCG/ML EP SOLN
EPIDURAL | Status: DC | PRN
Start: 2017-08-14 — End: 2017-08-14
  Administered 2017-08-14: 1 mL

## 2017-08-14 MED ORDER — SUCCINYLCHOLINE CHLORIDE 200 MG/10ML IV SOSY
PREFILLED_SYRINGE | INTRAVENOUS | Status: AC
  Filled 2017-08-14: qty 10

## 2017-08-14 MED ORDER — ACETAMINOPHEN 325 MG PO TABS: 650.0000 mg | ORAL_TABLET | Freq: Four times a day (QID) | ORAL | Status: DC | PRN

## 2017-08-14 MED ORDER — MENTHOL 3 MG MT LOZG: 1.0000 | LOZENGE | OROMUCOSAL | Status: DC | PRN

## 2017-08-14 MED ORDER — POTASSIUM CHLORIDE IN NACL 20-0.9 MEQ/L-% IV SOLN
INTRAVENOUS | Status: AC
  Administered 2017-08-14: 14:00:00 via INTRAVENOUS
  Filled 2017-08-14 (×2): qty 1000

## 2017-08-14 MED ORDER — SODIUM CHLORIDE 0.9% FLUSH
INTRAVENOUS | Status: DC | PRN
  Administered 2017-08-14: 20 mL

## 2017-08-14 MED ORDER — BUPIVACAINE LIPOSOME 1.3 % IJ SUSP
20.0000 mL | Freq: Once | INTRAMUSCULAR | Status: DC
  Filled 2017-08-14: qty 20

## 2017-08-14 MED ORDER — OXYCODONE HCL 5 MG/5ML PO SOLN: 5.0000 mg | Freq: Once | ORAL | Status: DC | PRN

## 2017-08-14 MED ORDER — HYDROMORPHONE HCL 1 MG/ML IJ SOLN
INTRAMUSCULAR | Status: AC
  Administered 2017-08-14: 0.5 mg via INTRAVENOUS
  Filled 2017-08-14: qty 1

## 2017-08-14 MED ORDER — PROPOFOL 10 MG/ML IV BOLUS
INTRAVENOUS | Status: AC
  Filled 2017-08-14: qty 40

## 2017-08-14 MED ORDER — HYDROCHLOROTHIAZIDE 12.5 MG PO CAPS
12.5000 mg | ORAL_CAPSULE | Freq: Every day | ORAL | Status: DC
  Administered 2017-08-15 – 2017-08-16 (×2): 12.5 mg via ORAL
  Filled 2017-08-14 (×2): qty 1

## 2017-08-14 MED ORDER — GABAPENTIN 300 MG PO CAPS
300.0000 mg | ORAL_CAPSULE | Freq: Three times a day (TID) | ORAL | Status: DC
  Administered 2017-08-14 – 2017-08-16 (×6): 300 mg via ORAL
  Filled 2017-08-14 (×6): qty 1

## 2017-08-14 MED ORDER — CLONIDINE HCL (ANALGESIA) 100 MCG/ML EP SOLN
150.0000 ug | Freq: Once | EPIDURAL | Status: DC
  Filled 2017-08-14: qty 1.5

## 2017-08-14 MED ORDER — ONDANSETRON HCL 4 MG/2ML IJ SOLN
INTRAMUSCULAR | Status: AC
  Filled 2017-08-14: qty 2

## 2017-08-14 MED ORDER — EPHEDRINE 5 MG/ML INJ
INTRAVENOUS | Status: AC
  Filled 2017-08-14: qty 10

## 2017-08-14 MED ORDER — METOCLOPRAMIDE HCL 5 MG PO TABS: 5.0000 mg | ORAL_TABLET | Freq: Three times a day (TID) | ORAL | Status: DC | PRN

## 2017-08-14 MED ORDER — SODIUM CHLORIDE 0.9 % IR SOLN
Status: DC | PRN
  Administered 2017-08-14: 1

## 2017-08-14 MED ORDER — MORPHINE SULFATE (PF) 4 MG/ML IV SOLN
INTRAVENOUS | Status: AC
  Filled 2017-08-14: qty 2

## 2017-08-14 MED ORDER — LEVOTHYROXINE SODIUM 112 MCG PO TABS
112.0000 ug | ORAL_TABLET | Freq: Every day | ORAL | Status: DC
  Administered 2017-08-15 – 2017-08-16 (×2): 112 ug via ORAL
  Filled 2017-08-14 (×2): qty 1

## 2017-08-14 MED ORDER — BUPIVACAINE HCL 0.5 % IJ SOLN
INTRAMUSCULAR | Status: DC | PRN
  Administered 2017-08-14: 20 mL

## 2017-08-14 MED ORDER — LACTATED RINGERS IV SOLN
INTRAVENOUS | Status: DC | PRN
  Administered 2017-08-14 (×2): via INTRAVENOUS

## 2017-08-14 MED ORDER — ROPIVACAINE HCL 7.5 MG/ML IJ SOLN
INTRAMUSCULAR | Status: DC | PRN
  Administered 2017-08-14: 20 mL via PERINEURAL

## 2017-08-14 MED ORDER — MIDAZOLAM HCL 2 MG/2ML IJ SOLN
INTRAMUSCULAR | Status: AC
  Filled 2017-08-14: qty 2

## 2017-08-14 MED ORDER — DEXTROSE 5 % IV SOLN
500.0000 mg | Freq: Four times a day (QID) | INTRAVENOUS | Status: DC | PRN
  Filled 2017-08-14: qty 5

## 2017-08-14 MED ORDER — ROCURONIUM BROMIDE 100 MG/10ML IV SOLN
INTRAVENOUS | Status: DC | PRN
  Administered 2017-08-14: 50 mg via INTRAVENOUS
  Administered 2017-08-14: 20 mg via INTRAVENOUS

## 2017-08-14 MED ORDER — METHOCARBAMOL 500 MG PO TABS
500.0000 mg | ORAL_TABLET | Freq: Four times a day (QID) | ORAL | Status: DC | PRN
  Administered 2017-08-14 – 2017-08-16 (×4): 500 mg via ORAL
  Filled 2017-08-14 (×4): qty 1

## 2017-08-14 MED ORDER — SUGAMMADEX SODIUM 200 MG/2ML IV SOLN
INTRAVENOUS | Status: DC | PRN
  Administered 2017-08-14: 220 mg via INTRAVENOUS

## 2017-08-14 MED ORDER — PHENOL 1.4 % MT LIQD: 1.0000 | OROMUCOSAL | Status: DC | PRN

## 2017-08-14 MED ORDER — TRANEXAMIC ACID 1000 MG/10ML IV SOLN
2000.0000 mg | Freq: Once | INTRAVENOUS | Status: DC
  Filled 2017-08-14 (×2): qty 20

## 2017-08-14 MED ORDER — CEFAZOLIN SODIUM-DEXTROSE 2-4 GM/100ML-% IV SOLN
2.0000 g | INTRAVENOUS | Status: AC
  Administered 2017-08-14: 2 g via INTRAVENOUS

## 2017-08-14 MED ORDER — ONDANSETRON HCL 4 MG PO TABS: 4.0000 mg | ORAL_TABLET | Freq: Four times a day (QID) | ORAL | Status: DC | PRN

## 2017-08-14 MED ORDER — SUGAMMADEX SODIUM 200 MG/2ML IV SOLN
INTRAVENOUS | Status: AC
  Filled 2017-08-14: qty 2

## 2017-08-14 MED ORDER — FENTANYL CITRATE (PF) 100 MCG/2ML IJ SOLN
INTRAMUSCULAR | Status: DC | PRN
  Administered 2017-08-14 (×5): 50 ug via INTRAVENOUS

## 2017-08-14 MED ORDER — CHLORHEXIDINE GLUCONATE 4 % EX LIQD: 60.0000 mL | Freq: Once | CUTANEOUS | Status: DC

## 2017-08-14 MED ORDER — TRANEXAMIC ACID 1000 MG/10ML IV SOLN
1000.0000 mg | INTRAVENOUS | Status: AC
  Administered 2017-08-14: 1000 mg via INTRAVENOUS
  Filled 2017-08-14: qty 10

## 2017-08-14 MED ORDER — DOCUSATE SODIUM 100 MG PO CAPS
100.0000 mg | ORAL_CAPSULE | Freq: Two times a day (BID) | ORAL | Status: DC
  Administered 2017-08-14 – 2017-08-16 (×4): 100 mg via ORAL
  Filled 2017-08-14 (×4): qty 1

## 2017-08-14 MED ORDER — HYDROMORPHONE HCL 1 MG/ML IJ SOLN
0.2500 mg | INTRAMUSCULAR | Status: DC | PRN
  Administered 2017-08-14 (×2): 0.5 mg via INTRAVENOUS

## 2017-08-14 MED ORDER — OXYCODONE HCL 5 MG PO TABS: 5.0000 mg | ORAL_TABLET | Freq: Once | ORAL | Status: DC | PRN

## 2017-08-14 MED ORDER — TRANEXAMIC ACID 1000 MG/10ML IV SOLN
INTRAVENOUS | Status: AC | PRN
  Administered 2017-08-14: 2000 mg via TOPICAL

## 2017-08-14 MED ORDER — OXYCODONE HCL 5 MG PO TABS
5.0000 mg | ORAL_TABLET | ORAL | Status: DC | PRN
  Administered 2017-08-14 – 2017-08-16 (×8): 10 mg via ORAL
  Filled 2017-08-14 (×8): qty 2

## 2017-08-14 MED ORDER — ACETAMINOPHEN 650 MG RE SUPP: 650.0000 mg | Freq: Four times a day (QID) | RECTAL | Status: DC | PRN

## 2017-08-14 MED ORDER — BUPIVACAINE-EPINEPHRINE (PF) 0.5% -1:200000 IJ SOLN
INTRAMUSCULAR | Status: AC
  Filled 2017-08-14: qty 30

## 2017-08-14 MED ORDER — PHENYLEPHRINE 40 MCG/ML (10ML) SYRINGE FOR IV PUSH (FOR BLOOD PRESSURE SUPPORT)
PREFILLED_SYRINGE | INTRAVENOUS | Status: AC
  Filled 2017-08-14: qty 10

## 2017-08-14 MED ORDER — SUGAMMADEX SODIUM 200 MG/2ML IV SOLN: INTRAVENOUS | Status: DC | PRN

## 2017-08-14 MED ORDER — 0.9 % SODIUM CHLORIDE (POUR BTL) OPTIME
TOPICAL | Status: DC | PRN
  Administered 2017-08-14: 250 mL

## 2017-08-14 MED ORDER — MORPHINE SULFATE (PF) 4 MG/ML IV SOLN
INTRAVENOUS | Status: DC | PRN
  Administered 2017-08-14: 8 mg

## 2017-08-14 MED ORDER — HYDROMORPHONE HCL 1 MG/ML IJ SOLN: 0.2500 mg | INTRAMUSCULAR | Status: DC | PRN

## 2017-08-14 MED ORDER — ONDANSETRON HCL 4 MG/2ML IJ SOLN
4.0000 mg | Freq: Four times a day (QID) | INTRAMUSCULAR | Status: DC | PRN
  Filled 2017-08-14: qty 2

## 2017-08-14 MED ORDER — PROPOFOL 10 MG/ML IV BOLUS
INTRAVENOUS | Status: DC | PRN
  Administered 2017-08-14: 150 mg via INTRAVENOUS
  Administered 2017-08-14: 50 mg via INTRAVENOUS

## 2017-08-14 MED ORDER — BUPIVACAINE-EPINEPHRINE (PF) 0.5% -1:200000 IJ SOLN
INTRAMUSCULAR | Status: DC | PRN
  Administered 2017-08-14: 20 mL

## 2017-08-14 MED ORDER — MIDAZOLAM HCL 5 MG/5ML IJ SOLN
INTRAMUSCULAR | Status: DC | PRN
  Administered 2017-08-14 (×2): 1 mg via INTRAVENOUS

## 2017-08-14 MED ORDER — RIVAROXABAN 10 MG PO TABS
10.0000 mg | ORAL_TABLET | Freq: Every day | ORAL | Status: DC
  Administered 2017-08-15 – 2017-08-16 (×2): 10 mg via ORAL
  Filled 2017-08-14 (×2): qty 1

## 2017-08-14 MED ORDER — METOCLOPRAMIDE HCL 5 MG/ML IJ SOLN
5.0000 mg | Freq: Three times a day (TID) | INTRAMUSCULAR | Status: DC | PRN
  Administered 2017-08-14: 10 mg via INTRAVENOUS
  Filled 2017-08-14: qty 2

## 2017-08-14 MED ORDER — BUPIVACAINE HCL (PF) 0.5 % IJ SOLN
INTRAMUSCULAR | Status: AC
  Filled 2017-08-14: qty 30

## 2017-08-14 MED ORDER — LIDOCAINE 2% (20 MG/ML) 5 ML SYRINGE
INTRAMUSCULAR | Status: AC
  Filled 2017-08-14: qty 5

## 2017-08-14 MED ORDER — MORPHINE SULFATE (PF) 4 MG/ML IV SOLN: 4.0000 mg | INTRAVENOUS | Status: DC | PRN

## 2017-08-14 MED ORDER — LIDOCAINE HCL (CARDIAC) 20 MG/ML IV SOLN
INTRAVENOUS | Status: DC | PRN
  Administered 2017-08-14: 100 mg via INTRAVENOUS

## 2017-08-14 MED ORDER — METOPROLOL TARTRATE 25 MG PO TABS
25.0000 mg | ORAL_TABLET | Freq: Two times a day (BID) | ORAL | Status: DC
  Administered 2017-08-14 – 2017-08-16 (×4): 25 mg via ORAL
  Filled 2017-08-14 (×4): qty 1

## 2017-08-14 MED ORDER — CEFAZOLIN SODIUM-DEXTROSE 2-4 GM/100ML-% IV SOLN
2.0000 g | Freq: Four times a day (QID) | INTRAVENOUS | Status: AC
  Administered 2017-08-14 (×2): 2 g via INTRAVENOUS
  Filled 2017-08-14 (×2): qty 100

## 2017-08-14 MED ORDER — EPHEDRINE SULFATE 50 MG/ML IJ SOLN
INTRAMUSCULAR | Status: DC | PRN
  Administered 2017-08-14 (×2): 10 mg via INTRAVENOUS

## 2017-08-14 MED ORDER — BUPIVACAINE LIPOSOME 1.3 % IJ SUSP
INTRAMUSCULAR | Status: DC | PRN
  Administered 2017-08-14: 20 mL

## 2017-08-14 MED ORDER — ROCURONIUM BROMIDE 10 MG/ML (PF) SYRINGE
PREFILLED_SYRINGE | INTRAVENOUS | Status: AC
Start: 2017-08-14 — End: 2017-08-14
  Filled 2017-08-14: qty 5

## 2017-08-14 MED ORDER — ONDANSETRON HCL 4 MG/2ML IJ SOLN
INTRAMUSCULAR | Status: DC | PRN
  Administered 2017-08-14: 4 mg via INTRAVENOUS

## 2017-08-14 MED ORDER — FENTANYL CITRATE (PF) 250 MCG/5ML IJ SOLN
INTRAMUSCULAR | Status: AC
  Filled 2017-08-14: qty 5

## 2017-08-14 SURGICAL SUPPLY — 82 items
BANDAGE ACE 4X5 VEL STRL LF (GAUZE/BANDAGES/DRESSINGS) ×2 IMPLANT
BANDAGE ELASTIC 6 VELCRO ST LF (GAUZE/BANDAGES/DRESSINGS) ×1 IMPLANT
BANDAGE ESMARK 6X9 LF (GAUZE/BANDAGES/DRESSINGS) ×1 IMPLANT
BLADE SAG 18X100X1.27 (BLADE) ×2 IMPLANT
BLADE SAW SGTL 13.0X1.19X90.0M (BLADE) ×1 IMPLANT
BNDG CMPR 9X6 STRL LF SNTH (GAUZE/BANDAGES/DRESSINGS) ×1
BNDG CMPR MED 15X6 ELC VLCR LF (GAUZE/BANDAGES/DRESSINGS) ×3
BNDG COHESIVE 6X5 TAN STRL LF (GAUZE/BANDAGES/DRESSINGS) ×2 IMPLANT
BNDG ELASTIC 6X15 VLCR STRL LF (GAUZE/BANDAGES/DRESSINGS) ×6 IMPLANT
BNDG ESMARK 6X9 LF (GAUZE/BANDAGES/DRESSINGS) ×2
BOWL SMART MIX CTS (DISPOSABLE) ×1 IMPLANT
CAPT KNEE TOTAL 3 ×1 IMPLANT
CEMENT BONE SIMPLEX SPEEDSET (Cement) ×2 IMPLANT
CONT SPEC 4OZ CLIKSEAL STRL BL (MISCELLANEOUS) ×1 IMPLANT
CONT SPECI 4OZ STER CLIK (MISCELLANEOUS) ×2 IMPLANT
COVER SURGICAL LIGHT HANDLE (MISCELLANEOUS) ×2 IMPLANT
CUFF TOURNIQUET SINGLE 34IN LL (TOURNIQUET CUFF) ×2 IMPLANT
CUFF TOURNIQUET SINGLE 44IN (TOURNIQUET CUFF) IMPLANT
DECANTER SPIKE VIAL GLASS SM (MISCELLANEOUS) ×3 IMPLANT
DRAPE INCISE IOBAN 66X45 STRL (DRAPES) ×1 IMPLANT
DRAPE ORTHO SPLIT 77X108 STRL (DRAPES) ×6
DRAPE SURG ORHT 6 SPLT 77X108 (DRAPES) ×3 IMPLANT
DRAPE U-SHAPE 47X51 STRL (DRAPES) ×2 IMPLANT
DRSG AQUACEL AG ADV 3.5X10 (GAUZE/BANDAGES/DRESSINGS) ×2 IMPLANT
DRSG AQUACEL AG ADV 3.5X14 (GAUZE/BANDAGES/DRESSINGS) ×1 IMPLANT
DRSG PAD ABDOMINAL 8X10 ST (GAUZE/BANDAGES/DRESSINGS) ×4 IMPLANT
DURAPREP 26ML APPLICATOR (WOUND CARE) ×4 IMPLANT
ELECT CAUTERY BLADE 6.4 (BLADE) ×2 IMPLANT
ELECT REM PT RETURN 9FT ADLT (ELECTROSURGICAL) ×2
ELECTRODE REM PT RTRN 9FT ADLT (ELECTROSURGICAL) ×1 IMPLANT
FLUID NSS /IRRIG 1000 ML XXX (MISCELLANEOUS) ×4 IMPLANT
GAUZE SPONGE 4X4 12PLY STRL (GAUZE/BANDAGES/DRESSINGS) ×2 IMPLANT
GLOVE BIOGEL PI IND STRL 7.5 (GLOVE) ×1 IMPLANT
GLOVE BIOGEL PI IND STRL 8 (GLOVE) ×1 IMPLANT
GLOVE BIOGEL PI INDICATOR 7.5 (GLOVE) ×1
GLOVE BIOGEL PI INDICATOR 8 (GLOVE) ×1
GLOVE ECLIPSE 7.0 STRL STRAW (GLOVE) ×2 IMPLANT
GLOVE SURG ORTHO 8.0 STRL STRW (GLOVE) ×2 IMPLANT
GOWN STRL REUS W/ TWL LRG LVL3 (GOWN DISPOSABLE) ×3 IMPLANT
GOWN STRL REUS W/TWL LRG LVL3 (GOWN DISPOSABLE) ×6
HANDPIECE INTERPULSE COAX TIP (DISPOSABLE) ×2
HOOD PEEL AWAY FLYTE STAYCOOL (MISCELLANEOUS) ×6 IMPLANT
IMMOBILIZER KNEE 20 (SOFTGOODS) IMPLANT
IMMOBILIZER KNEE 22 UNIV (SOFTGOODS) ×1 IMPLANT
IMMOBILIZER KNEE 24 THIGH 36 (MISCELLANEOUS) IMPLANT
IMMOBILIZER KNEE 24 UNIV (MISCELLANEOUS)
KIT BASIN OR (CUSTOM PROCEDURE TRAY) ×2 IMPLANT
KIT ROOM TURNOVER OR (KITS) ×2 IMPLANT
MANIFOLD NEPTUNE II (INSTRUMENTS) ×2 IMPLANT
NDL SAFETY ECLIPSE 18X1.5 (NEEDLE) ×1 IMPLANT
NDL SPNL 18GX3.5 QUINCKE PK (NEEDLE) ×1 IMPLANT
NEEDLE 22X1 1/2 (OR ONLY) (NEEDLE) ×1 IMPLANT
NEEDLE HYPO 18GX1.5 SHARP (NEEDLE) ×2
NEEDLE HYPO 22GX1.5 SAFETY (NEEDLE) ×2 IMPLANT
NEEDLE SPNL 18GX3.5 QUINCKE PK (NEEDLE) ×4 IMPLANT
NS IRRIG 1000ML POUR BTL (IV SOLUTION) ×4 IMPLANT
PACK TOTAL JOINT (CUSTOM PROCEDURE TRAY) ×2 IMPLANT
PAD ARMBOARD 7.5X6 YLW CONV (MISCELLANEOUS) ×4 IMPLANT
PAD CAST 4YDX4 CTTN HI CHSV (CAST SUPPLIES) ×1 IMPLANT
PADDING CAST COTTON 4X4 STRL (CAST SUPPLIES) ×2
PADDING CAST COTTON 6X4 STRL (CAST SUPPLIES) ×2 IMPLANT
PADDING CAST SYN 6 (CAST SUPPLIES) ×1
PADDING CAST SYNTHETIC 6X4 NS (CAST SUPPLIES) IMPLANT
SET HNDPC FAN SPRY TIP SCT (DISPOSABLE) IMPLANT
SPONGE LAP 18X18 X RAY DECT (DISPOSABLE) ×1 IMPLANT
STEM CEMENTED TRIATHLON (Stem) ×1 IMPLANT
STRIP CLOSURE SKIN 1/2X4 (GAUZE/BANDAGES/DRESSINGS) ×3 IMPLANT
SUCTION FRAZIER HANDLE 10FR (MISCELLANEOUS) ×1
SUCTION TUBE FRAZIER 10FR DISP (MISCELLANEOUS) ×1 IMPLANT
SUT MNCRL AB 3-0 PS2 18 (SUTURE) ×2 IMPLANT
SUT VIC AB 0 CT1 27 (SUTURE) ×10
SUT VIC AB 0 CT1 27XBRD ANBCTR (SUTURE) ×3 IMPLANT
SUT VIC AB 1 CT1 27 (SUTURE) ×12
SUT VIC AB 1 CT1 27XBRD ANBCTR (SUTURE) ×5 IMPLANT
SUT VIC AB 2-0 CT1 27 (SUTURE) ×8
SUT VIC AB 2-0 CT1 TAPERPNT 27 (SUTURE) ×4 IMPLANT
SYR 30ML LL (SYRINGE) ×6 IMPLANT
SYR TB 1ML LUER SLIP (SYRINGE) ×2 IMPLANT
TOWEL OR 17X24 6PK STRL BLUE (TOWEL DISPOSABLE) ×4 IMPLANT
TOWEL OR 17X26 10 PK STRL BLUE (TOWEL DISPOSABLE) ×4 IMPLANT
TRAY CATH 16FR W/PLASTIC CATH (SET/KITS/TRAYS/PACK) IMPLANT
WATER STERILE IRR 1000ML POUR (IV SOLUTION) ×1 IMPLANT

## 2017-08-14 NOTE — Transfer of Care (Signed)
Immediate Anesthesia Transfer of Care Note  Patient: Mary Dalton  Procedure(s) Performed: Procedure(s): RIGHT TOTAL KNEE ARTHROPLASTY (Right)  Patient Location: PACU  Anesthesia Type:General and Regional  Level of Consciousness: awake, alert , oriented and sedated  Airway & Oxygen Therapy: Patient Spontanous Breathing and Patient connected to nasal cannula oxygen  Post-op Assessment: Report given to RN, Post -op Vital signs reviewed and stable and Patient moving all extremities  Post vital signs: Reviewed and stable  Last Vitals:  Vitals:   08/14/17 0545 08/14/17 1049  BP: (!) 169/87 (!) 150/76  Pulse: 61 64  Resp: 18 17  Temp: 36.9 C 36.4 C  SpO2: 99% 100%    Last Pain: There were no vitals filed for this visit.       Complications: No apparent anesthesia complications

## 2017-08-14 NOTE — Interval H&P Note (Signed)
History and Physical Interval Note:  08/14/2017 7:30 AM  Mary Dalton  has presented today for surgery, with the diagnosis of RIGHT KNEE OSTEOARTHRITIS  The various methods of treatment have been discussed with the patient and family. After consideration of risks, benefits and other options for treatment, the patient has consented to  Procedure(s): RIGHT TOTAL KNEE ARTHROPLASTY (Right) as a surgical intervention .  The patient's history has been reviewed, patient examined, no change in status, stable for surgery.  I have reviewed the patient's chart and labs.  Questions were answered to the patient's satisfaction.     Anderson Malta

## 2017-08-14 NOTE — Anesthesia Procedure Notes (Signed)
Procedure Name: Intubation Date/Time: 08/14/2017 7:44 AM Performed by: Scheryl Darter Pre-anesthesia Checklist: Patient identified, Emergency Drugs available, Suction available and Patient being monitored Patient Re-evaluated:Patient Re-evaluated prior to induction Oxygen Delivery Method: Circle System Utilized Preoxygenation: Pre-oxygenation with 100% oxygen Induction Type: IV induction Ventilation: Mask ventilation without difficulty Laryngoscope Size: Miller and 2 Grade View: Grade I Tube type: Oral Tube size: 7.5 mm Number of attempts: 1 Airway Equipment and Method: Stylet and Oral airway Placement Confirmation: ETT inserted through vocal cords under direct vision,  positive ETCO2 and breath sounds checked- equal and bilateral Tube secured with: Tape Dental Injury: Teeth and Oropharynx as per pre-operative assessment

## 2017-08-14 NOTE — Anesthesia Postprocedure Evaluation (Signed)
Anesthesia Post Note  Patient: Mary Dalton  Procedure(s) Performed: Procedure(s) (LRB): RIGHT TOTAL KNEE ARTHROPLASTY (Right)     Patient location during evaluation: PACU Anesthesia Type: Regional and General Level of consciousness: awake and alert Pain management: pain level controlled Vital Signs Assessment: post-procedure vital signs reviewed and stable Respiratory status: spontaneous breathing, nonlabored ventilation, respiratory function stable and patient connected to nasal cannula oxygen Cardiovascular status: blood pressure returned to baseline and stable Postop Assessment: no signs of nausea or vomiting Anesthetic complications: no    Last Vitals:  Vitals:   08/14/17 1250 08/14/17 1326  BP: 123/74 136/67  Pulse: (!) 53 (!) 55  Resp: 12 13  Temp: 36.4 C   SpO2: 98% 98%    Last Pain:  Vitals:   08/14/17 1616  PainSc: Asleep                 Willem Klingensmith,JAMES TERRILL

## 2017-08-14 NOTE — Evaluation (Addendum)
Physical Therapy Evaluation Patient Details Name: Mary Dalton MRN: [id number] DOB: [birth year] Today's Date: 08/14/2017   History of Present Illness  Pt is a 61 y.o. female now s/p elective R TKA on 08/14/17. Pertinent PMH includes L TKA (03/2017), OA, CAD, HTN, CVA, NSTEMI.     Clinical Impression  Pt presents with pain and an overall decrease in functional mobility secondary to above. PTA, pt indep and lives at home with daughter. Educ on precautions and positioning. Today, pt able to take steps from bed to [facility] with RW and min guard for balance; further mobility and therex limited secondary to increased pain and nausea. Pt states she plans to receive continued rehab at [facility] (where she received rehab for previous L TKA) prior to d/c home. Pt would benefit from continued acute PT services to maximize functional mobility and independence.     Follow Up Recommendations DC plan and follow up therapy as arranged by surgeon;SNF    Equipment Recommendations  Other (comment) (defer to next venue)    Recommendations for Other Services OT consult     Precautions / Restrictions Precautions Precautions: Knee;Fall Precaution Booklet Issued: No Precaution Comments: Verbally reviewed precautions Restrictions Weight Bearing Restrictions: Yes RLE Weight Bearing: Weight bearing as tolerated      Mobility  Bed Mobility Overal bed mobility: Needs Assistance Bed Mobility: Supine to Sit;Sit to Supine     Supine to sit: Min assist;HOB elevated Sit to supine: Min assist;HOB elevated   General bed mobility comments: MinA to assist RLE into and out of bed; increased time and effort secondary to increased pain with mobility  Transfers Overall transfer level: Needs assistance Equipment used: Rolling walker (2 wheeled) Transfers: Sit to/from Stand Sit to Stand: Min guard         General transfer comment: Pt able to stand on 3rd attempt relying on momentum and  BUE support to power into standing with RW and min guard for balance. Stood a second time from bedside commode with min guard, requiring less effort overall  Ambulation/Gait Ambulation/Gait assistance: Min guard Ambulation Distance (Feet): 2 Feet Assistive device: Rolling walker (2 wheeled) Gait Pattern/deviations: Step-to pattern;Decreased weight shift to right;Antalgic Gait velocity: Decreased   General Gait Details: Able to take side steps to bedside commode with RW and min guard; significantly antalgic gait with decreased WB to R knee. Cues for technique with RW  Stairs            Wheelchair Mobility    Modified Rankin (Stroke Patients Only)       Balance Overall balance assessment: Needs assistance Sitting-balance support: No upper extremity supported;Feet supported Sitting balance-Leahy Scale: Fair     Standing balance support: Bilateral upper extremity supported;During functional activity Standing balance-Leahy Scale: Poor Standing balance comment: Reliant on BUE support                             Pertinent Vitals/Pain Pain Assessment: Faces Faces Pain Scale: Hurts even more Pain Location: R knee Pain Descriptors / Indicators: Discomfort;Guarding;Grimacing Pain Intervention(s): Limited activity within patient's tolerance;Monitored during session;Patient requesting pain meds-RN notified    Home Living Family/patient expects to be discharged to:: Skilled nursing facility Living Arrangements: Children               Additional Comments: Plans to d/c to [facility] (she received rehab here for her previous TKA)    Prior Function Level  of Independence: Independent               Hand Dominance        Extremity/Trunk Assessment   Upper Extremity Assessment Upper Extremity Assessment: Overall WFL for tasks assessed    Lower Extremity Assessment Lower Extremity Assessment: RLE deficits/detail RLE Deficits /  Details: R hip flexion grossly 3/5 RLE: Unable to fully assess due to pain       Communication   Communication: No difficulties  Cognition Arousal/Alertness: Awake/alert Behavior During Therapy: WFL for tasks assessed/performed Overall Cognitive Status: Within Functional Limits for tasks assessed                                        General Comments      Exercises     Assessment/Plan    PT Assessment Patient needs continued PT services  PT Problem List Decreased strength;Decreased range of motion;Decreased activity tolerance;Decreased balance;Decreased mobility;Decreased knowledge of use of DME;Decreased knowledge of precautions;Pain       PT Treatment Interventions DME instruction;Gait training;Stair training;Functional mobility training;Therapeutic activities;Therapeutic exercise;Balance training;Patient/family education    PT Goals (Current goals can be found in the Care Plan section)  Acute Rehab PT Goals Patient Stated Goal: Get more rehab before returning home PT Goal Formulation: With patient Time For Goal Achievement: 08/28/17 Potential to Achieve Goals: Good    Frequency 7X/week   Barriers to discharge        Co-evaluation               AM-PAC PT "6 Clicks" Daily Activity  Outcome Measure Difficulty turning over in bed (including adjusting bedclothes, sheets and blankets)?: Unable Difficulty moving from lying on back to sitting on the side of the bed? : Unable Difficulty sitting down on and standing up from a chair with arms (e.g., wheelchair, bedside commode, etc,.)?: Unable Help needed moving to and from a bed to chair (including a wheelchair)?: A Little Help needed walking in hospital room?: A Little Help needed climbing 3-5 steps with a railing? : A Lot 6 Click Score: 11    End of Session Equipment Utilized During Treatment: Gait belt Activity Tolerance: Patient tolerated treatment well;Patient limited by pain Patient left:  in bed;with call bell/phone within reach;with nursing/sitter in room;Other (comment) (R knee ext in bone foam) Nurse Communication: Mobility status;Patient requests pain meds PT Visit Diagnosis: Other abnormalities of gait and mobility (R26.89);Pain Pain - Right/Left: Right Pain - part of body: Knee    Time: 1501-1520 PT Time Calculation (min) (ACUTE ONLY): 19 min   Charges:   PT Evaluation $PT Eval Moderate Complexity: 1 Mod     PT G Codes:       Mabeline Caras, PT, DPT Acute Rehab Services  Pager: [place] 08/14/2017, 3:36 PM

## 2017-08-14 NOTE — Anesthesia Procedure Notes (Addendum)
Anesthesia Regional Block: Adductor canal block   Pre-Anesthetic Checklist: ,, timeout performed, Correct Patient, Correct Site, Correct Laterality, Correct Procedure, Correct Position, site marked, Risks and benefits discussed,  Surgical consent,  Pre-op evaluation,  At surgeon's request and post-op pain management  Laterality: Right and Lower  Prep: chloraprep        Procedures: ultrasound guided,,,,,,,,  Narrative:  Start time: 08/14/2017 7:00 AM End time: 08/14/2017 7:19 AM Injection made incrementally with aspirations every 5 mL.  Performed by: Personally  Anesthesiologist: Atha Muradyan

## 2017-08-14 NOTE — Progress Notes (Signed)
Orthopedic Tech Progress Note Patient Details:  MAYARI MATUS [birth year] [id number]  CPM Right Knee CPM Right Knee: On Right Knee Flexion (Degrees): 40 Right Knee Extension (Degrees): 0   Jessy Cybulski 08/14/2017, 11:08 AM ohf not applied because pt's weight exceeds durability of frame; RN notified

## 2017-08-15 ENCOUNTER — Encounter (HOSPITAL_COMMUNITY): Payer: Self-pay | Admitting: Orthopedic Surgery

## 2017-08-15 MED ORDER — POLYETHYLENE GLYCOL 3350 17 G PO PACK
17.0000 g | PACK | Freq: Every day | ORAL | Status: DC
  Administered 2017-08-15 – 2017-08-16 (×2): 17 g via ORAL
  Filled 2017-08-15 (×2): qty 1

## 2017-08-15 NOTE — Care Management Note (Signed)
Case Management Note  Patient Details  Name: HALIE GASS MRN: [id number] Date of Birth: 1956-03-16  Subjective/Objective:                 Patient admitted for RIGHT TOTAL KNEE ARTHROPLASTY. Per notes, plan is for SNF at DC, CSW following.    Action/Plan:   Expected Discharge Date:                  Expected Discharge Plan:  Skilled Nursing Facility  In-House Referral:  Clinical Social Work  Discharge planning Services  CM Consult  Post Acute Care Choice:    Choice offered to:     DME Arranged:    DME Agency:     HH Arranged:    [place] Agency:     Status of Service:  Completed, signed off  If discussed at [employer], dates discussed:    Additional Comments:  Carles Collet, RN 08/15/2017, 11:26 AM

## 2017-08-15 NOTE — Progress Notes (Signed)
Physical Therapy Treatment Patient Details Name: Mary Dalton MRN: [id number] DOB: 11-13-1956 Today's Date: 08/15/2017    History of Present Illness Pt is a 61 y.o. female now s/p elective R TKA on 08/14/17. Pertinent PMH includes L TKA (03/2017), OA, CAD, HTN, CVA, NSTEMI.    PT Comments    Pt progressing with mobility, remains motivated to participate with therapy. Able to transfer and amb in room with RW and min guard for balance; demonstrates decreased activity tolerance, requiring 1x standing rest break secondary to pain and fatigue. Reviewed seated therex, precautions, positioning, and CPM use. Will continue to follow acutely.   Follow Up Recommendations  DC plan and follow up therapy as arranged by surgeon;SNF     Equipment Recommendations  Other (comment) (defer to next venue)    Recommendations for Other Services       Precautions / Restrictions Precautions Precautions: Knee;Fall Precaution Booklet Issued: No Precaution Comments: Verbally reviewed precautions Required Braces or Orthoses: Knee Immobilizer - Right Knee Immobilizer - Right: Other (comment) ("maintain knee immobilizer", no specific insrtuctions given) Restrictions Weight Bearing Restrictions: No RLE Weight Bearing: Weight bearing as tolerated    Mobility  Bed Mobility Overal bed mobility: Needs Assistance Bed Mobility: Sit to Supine     Supine to sit: Min assist;HOB elevated Sit to supine: Min assist   General bed mobility comments: Received sitting in chair. MinA to assist RLE back into bed; min guard to scoot up in bed with BUE and LLE support  Transfers Overall transfer level: Needs assistance Equipment used: Rolling walker (2 wheeled) Transfers: Sit to/from Stand Sit to Stand: Min guard         General transfer comment: Cues for hand placement with RW  Ambulation/Gait Ambulation/Gait assistance: Min guard Ambulation Distance (Feet): 40 Feet Assistive device: Rolling walker (2  wheeled) Gait Pattern/deviations: Step-to pattern;Decreased weight shift to right;Antalgic Gait velocity: Decreased Gait velocity interpretation: <1.8 ft/sec, indicative of risk for recurrent falls General Gait Details: Amb in room with RW and min guard for safety. Cues to keep RW closer to body. Slow, antalgic gait requiring 1x standing rest break secondary to pain and fatigue   Stairs            Wheelchair Mobility    Modified Rankin (Stroke Patients Only)       Balance Overall balance assessment: Needs assistance Sitting-balance support: No upper extremity supported;Feet supported Sitting balance-Leahy Scale: Fair     Standing balance support: Bilateral upper extremity supported;During functional activity Standing balance-Leahy Scale: Poor Standing balance comment: Reliant on BUE support                            Cognition Arousal/Alertness: Awake/alert Behavior During Therapy: WFL for tasks assessed/performed Overall Cognitive Status: Within Functional Limits for tasks assessed                                        Exercises Total Joint Exercises Ankle Circles/Pumps: AROM;Right;10 reps;Seated Long Arc Quad: AROM;Right;10 reps;Seated Knee Flexion: AAROM;Right;10 reps;Seated;Other (comment) (AAROM with washcloth) Goniometric ROM: R knee flexion grossly 0-90'    General Comments        Pertinent Vitals/Pain Pain Assessment: 0-10 Pain Score: 4  Pain Location: R knee Pain Descriptors / Indicators: Discomfort;Guarding Pain Intervention(s): Limited activity within patient's tolerance;Monitored during session    Home Living Family/patient expects to  be discharged to:: Skilled nursing facility Living Arrangements: Children Available Help at Discharge: Family;Available 24 hours/day Type of Home: House Home Access: Stairs to enter Entrance Stairs-Rails: Right Home Layout: One level Home Equipment: Bedside commode;Walker - 2  wheels;Shower seat Additional Comments: Plans to d/c to [place] (she received rehab here for her previous TKA)    Prior Function Level of Independence: Independent      Comments: community ambulator and driver   PT Goals (current goals can now be found in the care plan section) Acute Rehab PT Goals Patient Stated Goal: Get more rehab before returning home PT Goal Formulation: With patient Time For Goal Achievement: 08/28/17 Potential to Achieve Goals: Good Progress towards PT goals: Progressing toward goals    Frequency    7X/week      PT Plan Current plan remains appropriate    Co-evaluation              AM-PAC PT "6 Clicks" Daily Activity  Outcome Measure  Difficulty turning over in bed (including adjusting bedclothes, sheets and blankets)?: A Little Difficulty moving from lying on back to sitting on the side of the bed? : Unable Difficulty sitting down on and standing up from a chair with arms (e.g., wheelchair, bedside commode, etc,.)?: Unable Help needed moving to and from a bed to chair (including a wheelchair)?: A Little Help needed walking in hospital room?: A Little Help needed climbing 3-5 steps with a railing? : A Lot 6 Click Score: 13    End of Session Equipment Utilized During Treatment: Gait belt Activity Tolerance: Patient tolerated treatment well Patient left: in bed;with call bell/phone within reach;in CPM Nurse Communication: Mobility status PT Visit Diagnosis: Other abnormalities of gait and mobility (R26.89);Pain Pain - Right/Left: Right Pain - part of body: Knee     Time: 2409-7353 PT Time Calculation (min) (ACUTE ONLY): 24 min  Charges:  $Gait Training: 8-22 mins $Therapeutic Exercise: 8-22 mins                    G Codes:      Mabeline Caras, PT, DPT Acute Rehab Services  Pager: [place] 08/15/2017, 2:32 PM

## 2017-08-15 NOTE — NC FL2 (Signed)
[place] LEVEL OF CARE SCREENING TOOL     IDENTIFICATION  Patient Name: Mary Dalton Birthdate: 1956-01-22 Sex: female Admission Date (Current Location): 08/14/2017  [facility] and [place] Number:  Herbalist and Address:  [facility], [place] [address], [place], [place] [address]      Provider Number: [id number]  Attending Physician Name and Address:  Meredith Pel, MD  Relative Name and Phone Number:     Shawntae Lowy, son, [phone]   Current Level of Care: Hospital Recommended Level of Care: [place] Prior Approval Number:    Date Approved/Denied: 08/15/17 PASRR Number: [id number]  Discharge Plan: SNF    Current Diagnoses: Patient Active Problem List   Diagnosis Date Noted  . Presence of left artificial knee joint 05/02/2017  . History of total knee arthroplasty 03/27/2017  . Arthritis of knee 03/20/2017  . Chronic pain of right knee 01/10/2017  . Primary osteoarthritis of right knee 01/10/2017  . Pain in right foot 01/10/2017  . Primary osteoarthritis of left knee 01/10/2017  . Hyperlipidemia 11/09/2015  . Essential hypertension   . Chest pain 10/24/2015  . History of non-ST elevation myocardial infarction (NSTEMI) 10/24/2015  . Diarrhea 10/24/2015  . Elevated troponin 10/24/2015  . Hypothyroidism 10/24/2015  . Stroke ([place])   . Hypertensive urgency   . GERD (gastroesophageal reflux disease)   . Gastroesophageal reflux disease without esophagitis     Orientation RESPIRATION BLADDER Height & Weight     Self, Time, Situation, Place  Normal Continent Weight:   Height:     BEHAVIORAL SYMPTOMS/MOOD NEUROLOGICAL BOWEL NUTRITION STATUS      Continent Diet (See DC Summary)  AMBULATORY STATUS COMMUNICATION OF NEEDS Skin   Limited Assist Verbally Surgical wounds (Right Knee, Compression wrap)                       Personal Care Assistance Level of Assistance               Functional Limitations Info             SPECIAL CARE FACTORS FREQUENCY  PT (By licensed PT)     PT Frequency: 7x week              Contractures      Additional Factors Info  Code Status, Allergies Code Status Info: Full Code Allergies Info: No Known Allergies           Current Medications (08/15/2017):  This is the current hospital active medication list Current Facility-Administered Medications  Medication Dose Route Frequency Provider Last Rate Last Dose  . 0.9 % NaCl with KCl 20 mEq/ L  infusion   Intravenous Continuous Meredith Pel, MD 75 mL/hr at 08/14/17 1350    . acetaminophen (TYLENOL) tablet 650 mg  650 mg Oral Q6H PRN Meredith Pel, MD       Or  . acetaminophen (TYLENOL) suppository 650 mg  650 mg Rectal Q6H PRN Meredith Pel, MD      . bupivacaine liposome (EXPAREL) 1.3 % injection 266 mg  20 mL Infiltration Once Meredith Pel, MD      . docusate sodium (COLACE) capsule 100 mg  100 mg Oral BID Meredith Pel, MD   100 mg at 08/15/17 0954  . gabapentin (NEURONTIN) capsule 300 mg  300 mg Oral TID Meredith Pel, MD   300 mg at 08/15/17 0954  . hydrochlorothiazide (  MICROZIDE) capsule 12.5 mg  12.5 mg Oral Daily Meredith Pel, MD   12.5 mg at 08/15/17 0954  . levothyroxine (SYNTHROID, LEVOTHROID) tablet 112 mcg  112 mcg Oral QAC breakfast Meredith Pel, MD   112 mcg at 08/15/17 [phone]  . menthol-cetylpyridinium (CEPACOL) lozenge 3 mg  1 lozenge Oral PRN Meredith Pel, MD       Or  . phenol (CHLORASEPTIC) mouth spray 1 spray  1 spray Mouth/Throat PRN Meredith Pel, MD      . methocarbamol (ROBAXIN) tablet 500 mg  500 mg Oral Q6H PRN Meredith Pel, MD   500 mg at 08/15/17 1638   Or  . methocarbamol (ROBAXIN) 500 mg in dextrose 5 % 50 mL IVPB  500 mg Intravenous Q6H PRN Meredith Pel, MD      . metoCLOPramide (REGLAN) tablet 5-10 mg  5-10 mg Oral Q8H PRN Meredith Pel, MD       Or  .  metoCLOPramide (REGLAN) injection 5-10 mg  5-10 mg Intravenous Q8H PRN Meredith Pel, MD   10 mg at 08/14/17 1701  . metoprolol tartrate (LOPRESSOR) tablet 25 mg  25 mg Oral BID Meredith Pel, MD   25 mg at 08/15/17 0955  . morphine 4 MG/ML injection 4 mg  4 mg Intravenous Q3H PRN Meredith Pel, MD      . ondansetron [facility]) tablet 4 mg  4 mg Oral Q6H PRN Meredith Pel, MD       Or  . ondansetron [facility]) injection 4 mg  4 mg Intravenous Q6H PRN Meredith Pel, MD      . oxyCODONE (Oxy IR/ROXICODONE) immediate release tablet 5-10 mg  5-10 mg Oral Q3H PRN Meredith Pel, MD   10 mg at 08/15/17 4536  . rivaroxaban (XARELTO) tablet 10 mg  10 mg Oral Q breakfast Meredith Pel, MD   10 mg at 08/15/17 4680  . tranexamic acid (CYKLOKAPRON) 2,000 mg in sodium chloride 0.9 % 50 mL Topical Application  3,212 mg Topical Once Meredith Pel, MD         Discharge Medications: Please see discharge summary for a list of discharge medications.  Relevant Imaging Results:  Relevant Lab Results:   Additional Information SS#:[id number] [place], LCSW

## 2017-08-15 NOTE — Clinical Social Work Note (Signed)
Clinical Social Work Assessment  Patient Details  Name: Mary Dalton MRN: [id number] Date of Birth: February 15, 1956  Date of referral:  08/15/17               Reason for consult:  Facility Placement                Permission sought to share information with:  Chartered certified accountant granted to share information::  Yes, Verbal Permission Granted  Name::        Agency::  [facility]  Relationship::     Contact Information:     Housing/Transportation Living arrangements for the past 2 months:  Single Family Home Source of Information:  Patient Patient Interpreter Needed:  None Criminal Activity/Legal Involvement Pertinent to Current Situation/Hospitalization:  No - Comment as needed Significant Relationships:  Adult Children Lives with:  Self Do you feel safe going back to the place where you live?  No Need for family participation in patient care:  No (Coment)  Care giving concerns:  Pt returned for Right Knee repair as she had left knee in may 2018. Patient resides at home with daughter prior to hospitalization.  Patient indicated that she was independent with ADL's until her continued knee issues.  She has supportive family and has adult daughter at home for support.  Patient may not be safe to return home due to physical impairment.  Social Worker assessment / plan:  CSW met with patient at bedside to discuss her disposition. Pt indicated that she would like to return to [facility] for rehab as she experienced them before with her left knee back in May. CSW discussed the SNF process, options, and insurance auth. Pt in agreement and voiced understanding.  FL2 complete. Passr confirmed. Offer sent to [facility]  Employment status:  Disabled (Comment on whether or not currently receiving Disability) Insurance information:  Other (Comment Required) (Private-BCBS) PT Recommendations:  [place] / Referral to community resources:  [place]  Patient/Family's Response to care:  Patient in agreement with SNF placement. No issues or concerns identified.  Patient/Family's Understanding of and Emotional Response to Diagnosis, Current Treatment, and Prognosis:  Patient has good understanding of diagnosis, current treatment and prognosis as she is hopeful she will improve with rehab. No issues or concerns identified.  Emotional Assessment Appearance:  Appears stated age Attitude/Demeanor/Rapport:   (Cooperative) Affect (typically observed):  Accepting, Appropriate Orientation:  Oriented to Self, Oriented to Place, Oriented to  Time, Oriented to Situation Alcohol / Substance use:  Not Applicable Psych involvement (Current and /or in the community):  No (Comment)  Discharge Needs  Concerns to be addressed:  Care Coordination Readmission within the last 30 days:  No Current discharge risk:  Dependent with Mobility, Physical Impairment Barriers to Discharge:  No Barriers Identified   Normajean Baxter, LCSW 08/15/2017, 12:26 PM

## 2017-08-15 NOTE — Evaluation (Signed)
Occupational Therapy Evaluation Patient Details Name: Mary Dalton MRN: [id number] DOB: 07/21/1956 Today's Date: 08/15/2017    History of Present Illness Pt is a 61 y.o. female now s/p elective R TKA on 08/14/17. Pertinent PMH includes L TKA (03/2017), OA, CAD, HTN, CVA, NSTEMI.    Clinical Impression   Pt with decline and function and safety with ADLs and ADL mobility with decreased activity tolerance and balance. Pt would benefit from acute OT services to address impairments to maximize level of safety and function    Follow Up Recommendations  DC plan and follow up therapy as arranged by surgeon;SNF    Equipment Recommendations  None recommended by OT    Recommendations for Other Services       Precautions / Restrictions Precautions Precautions: Knee;Fall Precaution Booklet Issued: No Precaution Comments: Verbally reviewed precautions Required Braces or Orthoses: Knee Immobilizer - Right Knee Immobilizer - Right: Other (comment) ("maintain knee immobilizer", no specific insrtuctions given) Restrictions Weight Bearing Restrictions: No RLE Weight Bearing: Weight bearing as tolerated      Mobility Bed Mobility Overal bed mobility: Needs Assistance Bed Mobility: Supine to Sit;Sit to Supine     Supine to sit: Min assist;HOB elevated     General bed mobility comments: MinA to assist RLE into and out of bed; increased time and effort secondary to increased pain with mobility  Transfers Overall transfer level: Needs assistance Equipment used: Rolling walker (2 wheeled) Transfers: Sit to/from Stand Sit to Stand: Min assist         General transfer comment: cues for correct hand placement    Balance Overall balance assessment: Needs assistance Sitting-balance support: No upper extremity supported;Feet supported Sitting balance-Leahy Scale: Fair     Standing balance support: Bilateral upper extremity supported;During functional activity Standing balance-Leahy  Scale: Poor                             ADL either performed or assessed with clinical judgement   ADL Overall ADL's : Needs assistance/impaired     Grooming: Wash/dry hands;Wash/dry face;Set up   Upper Body Bathing: Set up;Supervision/ safety   Lower Body Bathing: Maximal assistance   Upper Body Dressing : Set up;Supervision/safety   Lower Body Dressing: Maximal assistance   Toilet Transfer: Minimal assistance;Ambulation;RW;BSC;Cueing for safety   Toileting- Clothing Manipulation and Hygiene: Moderate assistance;Sit to/from stand;Sitting/lateral lean       Functional mobility during ADLs: Cueing for safety;Rolling walker;Minimal assistance       Vision Baseline Vision/History: Wears glasses Wears Glasses: Reading only Patient Visual Report: No change from baseline                  Pertinent Vitals/Pain Pain Assessment: 0-10 Pain Score: 4  Pain Location: R knee Pain Descriptors / Indicators: Discomfort;Guarding;Grimacing Pain Intervention(s): Limited activity within patient's tolerance;Monitored during session;Repositioned;Ice applied     Hand Dominance Right   Extremity/Trunk Assessment Upper Extremity Assessment Upper Extremity Assessment: Overall WFL for tasks assessed   Lower Extremity Assessment Lower Extremity Assessment: Defer to PT evaluation   Cervical / Trunk Assessment Cervical / Trunk Assessment: Normal   Communication Communication Communication: No difficulties   Cognition Arousal/Alertness: Awake/alert Behavior During Therapy: WFL for tasks assessed/performed Overall Cognitive Status: Within Functional Limits for tasks assessed  General Comments   pt very pleasant and cooperative               Home Living Family/patient expects to be discharged to:: Skilled nursing facility Living Arrangements: Children Available Help at Discharge: Family;Available 24  hours/day Type of Home: House Home Access: Stairs to enter [employer] of Steps: 2 Entrance Stairs-Rails: Right Home Layout: One level     Bathroom Shower/Tub: Occupational psychologist: Standard Bathroom Accessibility: Yes   Home Equipment: Bedside commode;Walker - 2 wheels;Shower seat   Additional Comments: Plans to d/c to [facility] (she received rehab here for her previous TKA)      Prior Functioning/Environment Level of Independence: Independent        Comments: community ambulator and driver        OT Problem List: Decreased activity tolerance;Decreased knowledge of use of DME or AE;Pain;Impaired balance (sitting and/or standing)      OT Treatment/Interventions: Self-care/ADL training;DME and/or AE instruction;Therapeutic activities;Therapeutic exercise;Patient/family education    OT Goals(Current goals can be found in the care plan section) Acute Rehab OT Goals Patient Stated Goal: Get more rehab before returning home OT Goal Formulation: With patient Time For Goal Achievement: 08/22/17 Potential to Achieve Goals: Good ADL Goals Pt Will Perform Grooming: with min assist;with min guard assist;standing Pt Will Perform Lower Body Bathing: with mod assist;sitting/lateral leans;sit to/from stand Pt Will Transfer to Toilet: with min guard assist;bedside commode;ambulating Pt Will Perform Toileting - Clothing Manipulation and hygiene: with min assist;sitting/lateral leans;sit to/from stand Additional ADL Goal #1: Pt will complete bed mobility with min guard A - sup to sit EOB in prep for ADLs  OT Frequency: Min 2X/week   Barriers to D/C: Decreased caregiver support                        AM-PAC PT "6 Clicks" Daily Activity     Outcome Measure Help from another person eating meals?: None Help from another person taking care of personal grooming?: A Little Help from another person toileting, which includes using  toliet, bedpan, or urinal?: A Lot Help from another person bathing (including washing, rinsing, drying)?: A Lot Help from another person to put on and taking off regular upper body clothing?: A Little Help from another person to put on and taking off regular lower body clothing?: A Lot 6 Click Score: 16   End of Session Equipment Utilized During Treatment: Right knee immobilizer;Rolling walker;Other (comment) (BSC) CPM Right Knee CPM Right Knee: Off  Activity Tolerance: Patient tolerated treatment well Patient left: in chair;with call bell/phone within reach  OT Visit Diagnosis: Unsteadiness on feet (R26.81);Pain Pain - Right/Left: Right Pain - part of body: Knee                Time: 4268-3419 OT Time Calculation (min): 25 min Charges:  OT General Charges $OT Visit: 1 Visit OT Evaluation $OT Eval Moderate Complexity: 1 Mod OT Treatments $Therapeutic Activity: 8-22 mins G-Codes: OT G-codes **NOT FOR INPATIENT CLASS** Functional Assessment Tool Used: AM-PAC 6 Clicks Daily Activity     Britt Bottom 08/15/2017, 2:01 PM

## 2017-08-15 NOTE — Discharge Instructions (Addendum)
Use CPM 6 hours daily. Increase daily to 0-90 degrees in three two hour sessions. Currently at 0-50 degrees.   Information on my medicine - XARELTO (Rivaroxaban)   Why was Xarelto prescribed for you? Xarelto was prescribed for you to reduce the risk of blood clots forming after orthopedic surgery. The medical term for these abnormal blood clots is venous thromboembolism (VTE).  What do you need to know about xarelto ? Take your Xarelto ONCE DAILY at the same time every day. You may take it either with or without food.  If you have difficulty swallowing the tablet whole, you may crush it and mix in applesauce just prior to taking your dose.  Take Xarelto exactly as prescribed by your doctor and DO NOT stop taking Xarelto without talking to the doctor who prescribed the medication.  Stopping without other VTE prevention medication to take the place of Xarelto may increase your risk of developing a clot.  After discharge, you should have regular check-up appointments with your healthcare provider that is prescribing your Xarelto.    What do you do if you miss a dose? If you miss a dose, take it as soon as you remember on the same day then continue your regularly scheduled once daily regimen the next day. Do not take two doses of Xarelto on the same day.   Important Safety Information A possible side effect of Xarelto is bleeding. You should call your healthcare provider right away if you experience any of the following: ? Bleeding from an injury or your nose that does not stop. ? Unusual colored urine (red or dark brown) or unusual colored stools (red or black). ? Unusual bruising for unknown reasons. ? A serious fall or if you hit your head (even if there is no bleeding).  Some medicines may interact with Xarelto and might increase your risk of bleeding while on Xarelto. To help avoid this, consult your healthcare provider or pharmacist prior to using any new prescription or  non-prescription medications, including herbals, vitamins, non-steroidal anti-inflammatory drugs (NSAIDs) and supplements.  This website has more information on Xarelto: [email or web address].[email or web address]

## 2017-08-16 DIAGNOSIS — I6300 Cerebral infarction due to thrombosis of unspecified precerebral artery: Secondary | ICD-10-CM | POA: Diagnosis not present

## 2017-08-16 DIAGNOSIS — Z96651 Presence of right artificial knee joint: Secondary | ICD-10-CM | POA: Diagnosis not present

## 2017-08-16 DIAGNOSIS — R262 Difficulty in walking, not elsewhere classified: Secondary | ICD-10-CM | POA: Diagnosis not present

## 2017-08-16 DIAGNOSIS — M1711 Unilateral primary osteoarthritis, right knee: Secondary | ICD-10-CM | POA: Diagnosis not present

## 2017-08-16 DIAGNOSIS — I160 Hypertensive urgency: Secondary | ICD-10-CM | POA: Diagnosis not present

## 2017-08-16 DIAGNOSIS — M1710 Unilateral primary osteoarthritis, unspecified knee: Secondary | ICD-10-CM | POA: Diagnosis not present

## 2017-08-16 DIAGNOSIS — G8911 Acute pain due to trauma: Secondary | ICD-10-CM | POA: Diagnosis not present

## 2017-08-16 DIAGNOSIS — D62 Acute posthemorrhagic anemia: Secondary | ICD-10-CM | POA: Diagnosis not present

## 2017-08-16 DIAGNOSIS — E039 Hypothyroidism, unspecified: Secondary | ICD-10-CM | POA: Diagnosis not present

## 2017-08-16 DIAGNOSIS — E034 Atrophy of thyroid (acquired): Secondary | ICD-10-CM | POA: Diagnosis not present

## 2017-08-16 DIAGNOSIS — R488 Other symbolic dysfunctions: Secondary | ICD-10-CM | POA: Diagnosis not present

## 2017-08-16 DIAGNOSIS — M6281 Muscle weakness (generalized): Secondary | ICD-10-CM | POA: Diagnosis not present

## 2017-08-16 DIAGNOSIS — S8990XA Unspecified injury of unspecified lower leg, initial encounter: Secondary | ICD-10-CM | POA: Diagnosis not present

## 2017-08-16 DIAGNOSIS — D649 Anemia, unspecified: Secondary | ICD-10-CM | POA: Diagnosis not present

## 2017-08-16 DIAGNOSIS — E785 Hyperlipidemia, unspecified: Secondary | ICD-10-CM | POA: Diagnosis not present

## 2017-08-16 DIAGNOSIS — I2510 Atherosclerotic heart disease of native coronary artery without angina pectoris: Secondary | ICD-10-CM | POA: Diagnosis not present

## 2017-08-16 DIAGNOSIS — I10 Essential (primary) hypertension: Secondary | ICD-10-CM | POA: Diagnosis not present

## 2017-08-16 DIAGNOSIS — Z471 Aftercare following joint replacement surgery: Secondary | ICD-10-CM | POA: Diagnosis not present

## 2017-08-16 MED ORDER — RIVAROXABAN 10 MG PO TABS: 10.0000 mg | ORAL_TABLET | Freq: Every day | ORAL | 0 refills | Status: DC

## 2017-08-16 MED ORDER — OXYCODONE HCL 5 MG PO TABS: 5.0000 mg | ORAL_TABLET | ORAL | 0 refills | Status: DC | PRN

## 2017-08-16 MED ORDER — MAGNESIUM HYDROXIDE 400 MG/5ML PO SUSP
30.0000 mL | Freq: Every day | ORAL | Status: DC | PRN
  Administered 2017-08-16: 30 mL via ORAL
  Filled 2017-08-16: qty 30

## 2017-08-16 MED ORDER — METHOCARBAMOL 500 MG PO TABS: 500.0000 mg | ORAL_TABLET | Freq: Four times a day (QID) | ORAL | 0 refills | Status: DC | PRN

## 2017-08-16 MED ORDER — DOCUSATE SODIUM 100 MG PO CAPS: 100.0000 mg | ORAL_CAPSULE | Freq: Two times a day (BID) | ORAL | 0 refills | Status: DC

## 2017-08-16 NOTE — Social Work (Addendum)
CSW following with SNF on authorization for placement.   CSW will continue to follow as SNF initiated insurance auth yesterday.  Pt will DC to [facility].  Elissa Hefty, LCSW Clinical Social Worker [phone]

## 2017-08-16 NOTE — Progress Notes (Signed)
Subjective: Patient stable.  Pain controlled.   Objective: Vital signs in last 24 hours: Temp:  [98.7 F (37.1 C)-99.1 F (37.3 C)] 99.1 F (37.3 C) (09/13 0606) Pulse Rate:  [66-68] 66 (09/13 0606) Resp:  [16] 16 (09/13 0606) BP: (131-146)/(59-72) 131/59 (09/13 0606) SpO2:  [98 %-99 %] 98 % (09/13 0606)  Intake/Output from previous day: 09/12 0701 - 09/13 0700 In: 2045 [P.O.:480; I.V.:1565] Out: -  Intake/Output this shift: Total I/O In: 480 [P.O.:480] Out: -   Exam:  Dorsiflexion/Plantar flexion intact  Labs: No results for input(s): HGB in the last 72 hours. No results for input(s): WBC, RBC, HCT, PLT in the last 72 hours. No results for input(s): NA, K, CL, CO2, BUN, CREATININE, GLUCOSE, CALCIUM in the last 72 hours. No results for input(s): LABPT, INR in the last 72 hours.  Assessment/Plan: Plan at this time is for transfer to skilled nursing today if bed is available.  If not tomorrow. Discharge summary done and prescriptions on chart   G Alphonzo Severance 08/16/2017, 12:16 PM

## 2017-08-16 NOTE — Progress Notes (Signed)
Pt ready for d/c to SNF today per MD. Report called to [facility], all questions answered. Belongings packed and will be sent with pt. Pt being transported to facility via [place].   Mary Dalton, Jerry Caras

## 2017-08-16 NOTE — Care Management Note (Signed)
Case Management Note  Patient Details  Name: Mary Dalton MRN: [id number] Date of Birth: 05-19-1956  Subjective/Objective:                    Action/Plan: Pt discharging [facility] today. No further needs per CM.   Expected Discharge Date:  08/16/17               Expected Discharge Plan:  Skilled Nursing Facility  In-House Referral:  Clinical Social Work  Discharge planning Services  CM Consult  Post Acute Care Choice:    Choice offered to:     DME Arranged:    DME Agency:     HH Arranged:    [place] Agency:     Status of Service:  Completed, signed off  If discussed at [employer], dates discussed:    Additional Comments:  Pollie Friar, RN 08/16/2017, 1:52 PM

## 2017-08-16 NOTE — Social Work (Addendum)
Clinical Social Worker facilitated patient discharge including contacting patient family and facility to confirm patient discharge plans.  Clinical information faxed to facility and family agreeable with plan.    CSW arranged ambulance transport via [facility] to [facility].    RN to call [phone] to give report prior to discharge.  Clinical Social Worker will sign off for now as social work intervention is no longer needed. Please consult us again if new need arises.  Zionah Criswell, LCSW Clinical Social Worker [phone]    

## 2017-08-16 NOTE — Progress Notes (Signed)
Physical Therapy Treatment Patient Details Name: Mary Dalton MRN: [id number] DOB: 1956/08/01 Today's Date: 08/16/2017    History of Present Illness Pt is a 61 y.o. female now s/p elective R TKA on 08/14/17. Pertinent PMH includes L TKA (03/2017), OA, CAD, HTN, CVA, NSTEMI.     PT Comments    Pt performed increased gait and mobility during session but remains to require min assist to complete activities.  Pt will continue to benefit from skilled rehab in a post acute setting to improve strength and functional mobility before returning home.  Increased WOB noted during session.     Follow Up Recommendations  DC plan and follow up therapy as arranged by surgeon;SNF     Equipment Recommendations  Other (comment) (defer to next venue)    Recommendations for Other Services OT consult     Precautions / Restrictions Precautions Precautions: Knee;Fall Precaution Booklet Issued: No Precaution Comments: Verbally reviewed precautions Restrictions Weight Bearing Restrictions: Yes RLE Weight Bearing: Weight bearing as tolerated    Mobility  Bed Mobility Overal bed mobility: Needs Assistance Bed Mobility: Supine to Sit     Supine to sit: Min assist;HOB elevated     General bed mobility comments: Pt required min assist to guide RLE to edge of bed due to pain and weakness.  Pt able to use rails and UEs to elevate trunk into sitting.  Increased cueing to scoot forward to prepare for sit to stand transfer.    Transfers Overall transfer level: Needs assistance Equipment used: Rolling walker (2 wheeled) Transfers: Sit to/from Stand Sit to Stand: Min guard;Min assist         General transfer comment: Cues for hand placement with RW, min assist to boost from low seated bed on second transfer patient required decreased assistance.    Ambulation/Gait Ambulation/Gait assistance: Min guard Ambulation Distance (Feet): 100 Feet Assistive device: Rolling walker (2 wheeled) Gait  Pattern/deviations: Step-to pattern;Decreased weight shift to right;Antalgic;Trunk flexed Gait velocity: Decreased Gait velocity interpretation: Below normal speed for age/gender General Gait Details: Pt performed increased gait during session but became increasingly fatigued as she neared the end of her trial.  Pt required cues for L foot clearance to improve gait symmetry.     Stairs            Wheelchair Mobility    Modified Rankin (Stroke Patients Only)       Balance Overall balance assessment: Needs assistance   Sitting balance-Leahy Scale: Fair       Standing balance-Leahy Scale: Poor                              Cognition Arousal/Alertness: Awake/alert Behavior During Therapy: WFL for tasks assessed/performed Overall Cognitive Status: Within Functional Limits for tasks assessed                                        Exercises Total Joint Exercises Ankle Circles/Pumps: AROM;Right;10 reps;Supine Quad Sets: AROM;Right;10 reps;Supine Towel Squeeze: AROM;Both;10 reps;Supine Short Arc Quad: AROM;Right;10 reps;Supine Heel Slides: Right;10 reps;Supine;AAROM Hip ABduction/ADduction: Right;10 reps;Supine;AAROM Straight Leg Raises: AAROM;Right;10 reps;Supine Goniometric ROM: 82 degrees flexion in R knee.      General Comments        Pertinent Vitals/Pain Pain Assessment: 0-10 Pain Score: 2  Pain Location: R knee Pain Descriptors / Indicators: Discomfort;Guarding Pain Intervention(s): Monitored during session;Repositioned;Ice  applied    Home Living                      Prior Function            PT Goals (current goals can now be found in the care plan section) Acute Rehab PT Goals Patient Stated Goal: Get more rehab before returning home Potential to Achieve Goals: Good Progress towards PT goals: Progressing toward goals    Frequency    7X/week      PT Plan Current plan remains appropriate     Co-evaluation              AM-PAC PT "6 Clicks" Daily Activity  Outcome Measure  Difficulty turning over in bed (including adjusting bedclothes, sheets and blankets)?: A Little Difficulty moving from lying on back to sitting on the side of the bed? : Unable Difficulty sitting down on and standing up from a chair with arms (e.g., wheelchair, bedside commode, etc,.)?: Unable Help needed moving to and from a bed to chair (including a wheelchair)?: A Little Help needed walking in hospital room?: A Little Help needed climbing 3-5 steps with a railing? : A Lot 6 Click Score: 13    End of Session Equipment Utilized During Treatment: Gait belt Activity Tolerance: Patient tolerated treatment well Patient left: with call bell/phone within reach;in CPM;in chair Nurse Communication: Mobility status PT Visit Diagnosis: Other abnormalities of gait and mobility (R26.89);Pain Pain - Right/Left: Right Pain - part of body: Knee     Time: 7628-3151 PT Time Calculation (min) (ACUTE ONLY): 31 min  Charges:  $Gait Training: 8-22 mins $Therapeutic Exercise: 8-22 mins                    G Codes:       Governor Rooks, PTA pager [phone]    Cristela Blue 08/16/2017, 2:46 PM

## 2017-08-16 NOTE — Clinical Social Work Placement (Signed)
   CLINICAL SOCIAL WORK PLACEMENT  NOTE  Date:  08/16/2017  Patient Details  Name: Mary Dalton MRN: [id number] Date of Birth: Sep 16, 1956  Clinical Social Work is seeking post-discharge placement for this patient at the [place] level of care (*CSW will initial, date and re-position this form in  chart as items are completed):  Yes   Patient/family provided with [place] Work Department's list of facilities offering this level of care within the geographic area requested by the patient (or if unable, by the patient's family).  Yes   Patient/family informed of their freedom to choose among providers that offer the needed level of care, that participate in Medicare, Medicaid or managed care program needed by the patient, have an available bed and are willing to accept the patient.  Yes   Patient/family informed of [facility]'s ownership interest in [facility] and [facility], as well as of the fact that they are under no obligation to receive care at these facilities.  PASRR submitted to EDS on 08/15/17     PASRR number received on       Existing PASRR number confirmed on 08/15/17     FL2 transmitted to all facilities in geographic area requested by pt/family on       FL2 transmitted to all facilities within larger geographic area on       Patient informed that his/her managed care company has contracts with or will negotiate with certain facilities, including the following:        Yes   Patient/family informed of bed offers received.  Patient chooses bed at [facility]     Physician recommends and patient chooses bed at      Patient to be transferred to [facility] on 08/16/17.  Patient to be transferred to facility by [facility]     Patient family notified on 08/16/17 of transfer.  Name of family member notified:  responsible for self     PHYSICIAN Please prepare priority discharge summary,  including medications, Please prepare prescriptions     Additional Comment:    _______________________________________________ Normajean Baxter, LCSW 08/16/2017, 12:47 PM

## 2017-08-16 NOTE — Discharge Summary (Signed)
Physician Discharge Summary  Patient ID: Mary Dalton MRN: [id number] DOB/AGE: 1956-07-25 61 y.o.  Admit date: 08/14/2017 Discharge date: 08/16/2017  Admission Diagnoses:  Active Problems:   Arthritis of knee   Discharge Diagnoses:  Same  Surgeries: Procedure(s): RIGHT TOTAL KNEE ARTHROPLASTY on 08/14/2017   Consultants:   Discharged Condition: Stable  Hospital Course: Mary Dalton is an 61 y.o. female who was admitted 08/14/2017 with a chief complaint of right knee pain, and found to have a diagnosis of right knee arthritis.  They were brought to the operating room on 08/14/2017 and underwent the above named procedures.atient tolerated the procedure well.  She mobilized with physical therapy and made good progress well she was in the hospital.  She was started on Xarelto for DVT prophylaxis.  She is discharged to skilled nursing in good condition.  She will follow-up with me in 10 days.  Continue to work on range of motion strengthening. She is discharged in good condition.    Antibiotics given:  Anti-infectives    Start     Dose/Rate Route Frequency Ordered Stop   08/14/17 1430  ceFAZolin (ANCEF) IVPB 2g/100 mL premix     2 g 200 mL/hr over 30 Minutes Intravenous Every 6 hours 08/14/17 1324 08/14/17 2011   08/14/17 0650  ceFAZolin (ANCEF) IVPB 2g/100 mL premix     2 g 200 mL/hr over 30 Minutes Intravenous On call to O.R. 08/14/17 [id number] 08/14/17 0745    .  Recent vital signs:  Vitals:   08/15/17 2109 08/16/17 0606  BP: (!) 146/72 (!) 131/59  Pulse: 68 66  Resp: 16 16  Temp: 98.7 F (37.1 C) 99.1 F (37.3 C)  SpO2: 99% 98%    Recent laboratory studies:  Results for orders placed or performed during the hospital encounter of 08/02/17  Surgical pcr screen  Result Value Ref Range   MRSA, PCR NEGATIVE NEGATIVE   Staphylococcus aureus NEGATIVE NEGATIVE  Urine culture  Result Value Ref Range   Specimen Description URINE, CLEAN CATCH    Special Requests NONE    Culture MULTIPLE SPECIES PRESENT, SUGGEST RECOLLECTION (A)    Report Status 08/03/2017 FINAL   Basic metabolic panel  Result Value Ref Range   Sodium 139 135 - 145 mmol/L   Potassium 3.8 3.5 - 5.1 mmol/L   Chloride 106 101 - 111 mmol/L   CO2 25 22 - 32 mmol/L   Glucose, Bld 98 65 - 99 mg/dL   BUN 13 6 - 20 mg/dL   Creatinine, Ser 0.72 0.44 - 1.00 mg/dL   Calcium 9.1 8.9 - 10.3 mg/dL   GFR calc non Af Amer >60 >60 mL/min   GFR calc Af Amer >60 >60 mL/min   Anion gap 8 5 - 15  CBC  Result Value Ref Range   WBC 7.3 4.0 - 10.5 K/uL   RBC 4.28 3.87 - 5.11 MIL/uL   Hemoglobin 13.2 12.0 - 15.0 g/dL   HCT 40.1 36.0 - 46.0 %   MCV 93.7 78.0 - 100.0 fL   MCH 30.8 26.0 - 34.0 pg   MCHC 32.9 30.0 - 36.0 g/dL   RDW 14.1 11.5 - 15.5 %   Platelets 357 150 - 400 K/uL  Urinalysis, Routine w reflex microscopic  Result Value Ref Range   Color, Urine YELLOW YELLOW   APPearance CLEAR CLEAR   Specific Gravity, Urine 1.015 1.005 - 1.030   pH 5.0 5.0 - 8.0   Glucose, UA NEGATIVE NEGATIVE mg/dL  Hgb urine dipstick MODERATE (A) NEGATIVE   Bilirubin Urine NEGATIVE NEGATIVE   Ketones, ur NEGATIVE NEGATIVE mg/dL   Protein, ur NEGATIVE NEGATIVE mg/dL   Nitrite NEGATIVE NEGATIVE   Leukocytes, UA NEGATIVE NEGATIVE   RBC / HPF 0-5 0 - 5 RBC/hpf   WBC, UA 0-5 0 - 5 WBC/hpf   Bacteria, UA NONE SEEN NONE SEEN   Squamous Epithelial / LPF 6-30 (A) NONE SEEN    Discharge Medications:   Allergies as of 08/16/2017      Reactions   No Known Allergies       Medication List    STOP taking these medications   aspirin EC 81 MG tablet     TAKE these medications   docusate sodium 100 MG capsule Commonly known as:  COLACE Take 1 capsule (100 mg total) by mouth 2 (two) times daily.   hydrochlorothiazide 12.5 MG capsule Commonly known as:  MICROZIDE Take 1 capsule (12.5 mg total) by mouth daily.   levothyroxine 112 MCG tablet Commonly known as:  SYNTHROID, LEVOTHROID Take 112 mcg by mouth daily  before breakfast.   methocarbamol 500 MG tablet Commonly known as:  ROBAXIN Take 1 tablet (500 mg total) by mouth every 6 (six) hours as needed for muscle spasms.   metoprolol tartrate 25 MG tablet Commonly known as:  LOPRESSOR Take 1 tablet (25 mg total) by mouth 2 (two) times daily.   oxyCODONE 5 MG immediate release tablet Commonly known as:  Oxy IR/ROXICODONE Take 1-2 tablets (5-10 mg total) by mouth every 3 (three) hours as needed for breakthrough pain.   rivaroxaban 10 MG Tabs tablet Commonly known as:  XARELTO Take 1 tablet (10 mg total) by mouth daily with breakfast.            Discharge Care Instructions        Start     Ordered   08/17/17 0000  rivaroxaban (XARELTO) 10 MG TABS tablet  Daily with breakfast     08/16/17 1219   08/16/17 0000  docusate sodium (COLACE) 100 MG capsule  2 times daily     08/16/17 1219   08/16/17 0000  methocarbamol (ROBAXIN) 500 MG tablet  Every 6 hours PRN     08/16/17 1219   08/16/17 0000  oxyCODONE (OXY IR/ROXICODONE) 5 MG immediate release tablet  Every  3 hours PRN     08/16/17 1219   08/16/17 0000  Call MD / Call 911    Comments:  If you experience chest pain or shortness of breath, CALL 911 and be transported to the hospital emergency room.  If you develope a fever above 101 F, pus (white drainage) or increased drainage or redness at the wound, or calf pain, call your surgeon's office.   08/16/17 1219   08/16/17 0000  Diet - low sodium heart healthy     08/16/17 1219   08/16/17 0000  Constipation Prevention    Comments:  Drink plenty of fluids.  Prune juice may be helpful.  You may use a stool softener, such as Colace (over the counter) 100 mg twice a day.  Use MiraLax (over the counter) for constipation as needed.   08/16/17 1219   08/16/17 0000  Increase activity slowly as tolerated     08/16/17 1219   08/16/17 0000  Discharge instructions    Comments:  Weightbearing as tolerated right lower extremity Use walker to assist  with ambulation Use knee immobilizer when walking until patient can do 10 straight leg  raises on her own Remove dressing next Friday. Okay to shower now.  Dressings waterproof   08/16/17 1219      Diagnostic Studies: No results found.  Disposition: 03-Skilled Nursing Facility  Discharge Instructions    Call MD / Call 911    Complete by:  As directed    If you experience chest pain or shortness of breath, CALL 911 and be transported to the hospital emergency room.  If you develope a fever above 101 F, pus (white drainage) or increased drainage or redness at the wound, or calf pain, call your surgeon's office.   Constipation Prevention    Complete by:  As directed    Drink plenty of fluids.  Prune juice may be helpful.  You may use a stool softener, such as Colace (over the counter) 100 mg twice a day.  Use MiraLax (over the counter) for constipation as needed.   Diet - low sodium heart healthy    Complete by:  As directed    Discharge instructions    Complete by:  As directed    Weightbearing as tolerated right lower extremity Use walker to assist with ambulation Use knee immobilizer when walking until patient can do 10 straight leg raises on her own Remove dressing next Friday. Okay to shower now.  Dressings waterproof   Increase activity slowly as tolerated    Complete by:  As directed          Signed: Anderson Malta 08/16/2017, 12:19 PM

## 2017-08-17 ENCOUNTER — Non-Acute Institutional Stay (SKILLED_NURSING_FACILITY): Payer: BLUE CROSS/BLUE SHIELD | Admitting: Internal Medicine

## 2017-08-17 ENCOUNTER — Encounter: Payer: Self-pay | Admitting: Internal Medicine

## 2017-08-17 DIAGNOSIS — I6300 Cerebral infarction due to thrombosis of unspecified precerebral artery: Secondary | ICD-10-CM

## 2017-08-17 DIAGNOSIS — M1711 Unilateral primary osteoarthritis, right knee: Secondary | ICD-10-CM | POA: Diagnosis not present

## 2017-08-17 DIAGNOSIS — D62 Acute posthemorrhagic anemia: Secondary | ICD-10-CM

## 2017-08-17 DIAGNOSIS — E034 Atrophy of thyroid (acquired): Secondary | ICD-10-CM

## 2017-08-17 DIAGNOSIS — Z96651 Presence of right artificial knee joint: Secondary | ICD-10-CM

## 2017-08-17 DIAGNOSIS — I2510 Atherosclerotic heart disease of native coronary artery without angina pectoris: Secondary | ICD-10-CM

## 2017-08-17 DIAGNOSIS — I10 Essential (primary) hypertension: Secondary | ICD-10-CM

## 2017-08-17 NOTE — Progress Notes (Signed)
: Provider:  Noah Delaine. Sheppard Coil, MD Location:  [place] Room Number: 104 Place of Service:  SNF ([phone])  PCP: Velna Hatchet, MD Patient Care Team: Velna Hatchet, MD as PCP - General (Internal Medicine) Marlou Sa, Tonna Corner, MD as Consulting Physician (Orthopedic Surgery)  Extended Emergency Contact Information Primary Emergency Contact: Raechel Chute [address] [address] [place] of [place] Phone: [phone] Relation: Son Secondary Emergency Contact: [place] of [place] Mobile Phone: [phone] Relation: Son     Allergies: No known allergies  Chief Complaint  Patient presents with  . New Admit To SNF    following hospitalization 08/14/17 to 08/16/17 right total knee arthroplasty Dr. Marcene Duos    HPI: Patient is 61 y.o. female with hypertension, CAD, status post stroke, hyperlipidemia, hypothyroidism, GERD who was admitted to [facility] from 9/11-13 for a right total knee arthroplasty secondary to end-stage osteoarthritis. There were no, medications, and patient was started on Xarelto for DVT prophylaxis. Patient is admitted to skilled nursing facility for OT/PT. While at skilled nursing facility patient will be followed for hypertension treated with hydrochlorothiazide and metoprolol, hypothyroidism treated with Synthroid and status post stroke treated with ASA, now on hold. The patient is on Xarelto.  Past Medical History:  Diagnosis Date  . Arthritis    "knees" (08/14/2017)  . CAD (coronary artery disease), native coronary artery 10/2015   diffuse disease, but non obstructive  . Elevated troponin 10/24/2015  . GERD (gastroesophageal reflux disease)   . Gout   . High cholesterol   . History of non-ST elevation myocardial infarction (NSTEMI) 10/24/2015  . History of total knee arthroplasty 03/27/2017   right  . Hypertension   . Hypothyroidism   . Metabolic syndrome   . Primary  osteoarthritis of right knee 01/10/2017  . STEMI (ST elevation myocardial infarction) ([place]) "~ 10/2015"   Archie Endo 08/12/2017  . Stroke [facility])    "dx'd in 10/2015 when I went in for my cardiac cath" (03/20/2017)    Past Surgical History:  Procedure Laterality Date  . CARDIAC CATHETERIZATION N/A 10/26/2015   Procedure: Left Heart Cath and Coronary Angiography;  Surgeon: Jettie Booze, MD;  Location: [place] CV LAB;  Service: Cardiovascular;  Laterality: N/A;  . JOINT REPLACEMENT    . LAPAROSCOPIC CHOLECYSTECTOMY    . SHOULDER ARTHROSCOPY WITH OPEN ROTATOR CUFF REPAIR Bilateral   . TOTAL KNEE ARTHROPLASTY Left 03/20/2017   Procedure: TOTAL KNEE ARTHROPLASTY;  Surgeon: Meredith Pel, MD;  Location: [place];  Service: Orthopedics;  Laterality: Left;  . TOTAL KNEE ARTHROPLASTY Right 08/14/2017  . TOTAL KNEE ARTHROPLASTY Right 08/14/2017   Procedure: RIGHT TOTAL KNEE ARTHROPLASTY;  Surgeon: Meredith Pel, MD;  Location: [place];  Service: Orthopedics;  Laterality: Right;  . TUBAL LIGATION      Allergies as of 08/17/2017      Reactions   No Known Allergies       Medication List       Accurate as of 08/17/17  2:19 PM. Always use your most recent med list.          docusate sodium 100 MG capsule Commonly known as:  COLACE Take 1 capsule (100 mg total) by mouth 2 (two) times daily.   hydrochlorothiazide 12.5 MG capsule Commonly known as:  MICROZIDE Take 1 capsule (12.5 mg total) by mouth daily.   levothyroxine 112 MCG tablet Commonly known as:  SYNTHROID, LEVOTHROID  Take 112 mcg by mouth daily before breakfast.   methocarbamol 500 MG tablet Commonly known as:  ROBAXIN Take 1 tablet (500 mg total) by mouth every 6 (six) hours as needed for muscle spasms.   metoprolol tartrate 25 MG tablet Commonly known as:  LOPRESSOR Take 1 tablet (25 mg total) by mouth 2 (two) times daily.   oxyCODONE 5 MG immediate release tablet Commonly known as:  Oxy IR/ROXICODONE Take 5 mg by  mouth. Take one tablet every 4 hours as needed for moderate pain. Take two tablets as needed for severe pain   rivaroxaban 10 MG Tabs tablet Commonly known as:  XARELTO Take 1 tablet (10 mg total) by mouth daily with breakfast.       Meds ordered this encounter  Medications  . oxyCODONE (OXY IR/ROXICODONE) 5 MG immediate release tablet    Sig: Take 5 mg by mouth. Take one tablet every 4 hours as needed for moderate pain. Take two tablets as needed for severe pain    Immunization History  Administered Date(s) Administered  . PPD Test 03/23/2017, 08/16/2017    Social History  Substance Use Topics  . Smoking status: Never Smoker  . Smokeless tobacco: Never Used  . Alcohol use No    Family history is   Family History  Problem Relation Age of Onset  . Healthy Mother   . Diabetes Mother   . Healthy Father   . Healthy Brother   . Healthy Brother   . Healthy Brother       Review of Systems  DATA OBTAINED: from patient, nurse GENERAL:  no fevers, fatigue, appetite changes SKIN: No itching, or rash EYES: No eye pain, redness, discharge EARS: No earache, tinnitus, change in hearing NOSE: No congestion, drainage or bleeding  MOUTH/THROAT: No mouth or tooth pain, No sore throat RESPIRATORY: No cough, wheezing, SOB CARDIAC: No chest pain, palpitations, lower extremity edema  GI: No abdominal pain, No N/V/D or constipation, No heartburn or reflux  GU: No dysuria, frequency or urgency, or incontinence  MUSCULOSKELETAL: No unrelieved bone/joint pain NEUROLOGIC: No headache, dizziness or focal weakness PSYCHIATRIC: No c/o anxiety or sadness   Vitals:   08/17/17 1407  BP: (!) 142/70  Pulse: 65  Resp: 18  Temp: 98.6 F (37 C)    SpO2 Readings from Last 1 Encounters:  08/16/17 100%   Body mass index is 39.51 kg/m.     Physical Exam  GENERAL APPEARANCE: Alert, conversant,  No acute distress.  SKIN: No diaphoresis rash;Incision area looks very well with no more  than expected heat or swelling HEAD: Normocephalic, atraumatic  EYES: Conjunctiva/lids clear. Pupils round, reactive. EOMs intact.  EARS: External exam WNL, canals clear. Hearing grossly normal.  NOSE: No deformity or discharge.  MOUTH/THROAT: Lips w/o lesions  RESPIRATORY: Breathing is even, unlabored. Lung sounds are clear   CARDIOVASCULAR: Heart RRR no murmurs, rubs or gallops. Mild right lower extremity peripheral edema.   GASTROINTESTINAL: Abdomen is soft, non-tender, not distended w/ normal bowel sounds. GENITOURINARY: Bladder non tender, not distended  MUSCULOSKELETAL: No abnormal joints or musculature-status post right arthroplasty of knee NEUROLOGIC:  Cranial nerves 2-12 grossly intact. Moves all extremities  PSYCHIATRIC: Mood and affect appropriate to situation, no behavioral issues  Patient Active Problem List   Diagnosis Date Noted  . Presence of left artificial knee joint 05/02/2017  . History of total knee arthroplasty 03/27/2017  . Arthritis of knee 03/20/2017  . Chronic pain of right knee 01/10/2017  . Primary osteoarthritis  of right knee 01/10/2017  . Pain in right foot 01/10/2017  . Primary osteoarthritis of left knee 01/10/2017  . Hyperlipidemia 11/09/2015  . Essential hypertension   . Chest pain 10/24/2015  . History of non-ST elevation myocardial infarction (NSTEMI) 10/24/2015  . Diarrhea 10/24/2015  . Elevated troponin 10/24/2015  . Hypothyroidism 10/24/2015  . Stroke ([place])   . Hypertensive urgency   . GERD (gastroesophageal reflux disease)   . Gastroesophageal reflux disease without esophagitis       Labs reviewed: Basic Metabolic Panel:    Component Value Date/Time   NA 139 08/02/2017 1126   NA 139 03/28/2017   K 3.8 08/02/2017 1126   CL 106 08/02/2017 1126   CO2 25 08/02/2017 1126   GLUCOSE 98 08/02/2017 1126   BUN 13 08/02/2017 1126   BUN 16 03/28/2017   CREATININE 0.72 08/02/2017 1126   CALCIUM 9.1 08/02/2017 1126   PROT 7.5 10/24/2015  1700   ALBUMIN 3.5 10/24/2015 1700   AST 40 10/24/2015 1700   ALT 35 10/24/2015 1700   ALKPHOS 152 (H) 10/24/2015 1700   BILITOT 0.7 10/24/2015 1700   GFRNONAA >60 08/02/2017 1126   GFRAA >60 08/02/2017 1126     Recent Labs  03/12/17 1043 03/28/17 08/02/17 1126  NA 139 139 139  K 3.8 4.7 3.8  CL 108  --  106  CO2 25  --  25  GLUCOSE 115*  --  98  BUN 11 16 13   CREATININE 0.69 0.6 0.72  CALCIUM 9.0  --  9.1   Liver Function Tests: No results for input(s): AST, ALT, ALKPHOS, BILITOT, PROT, ALBUMIN in the last 8760 hours. No results for input(s): LIPASE, AMYLASE in the last 8760 hours. No results for input(s): AMMONIA in the last 8760 hours. CBC:  Recent Labs  03/12/17 1043 03/28/17 08/02/17 1126  WBC 6.5 10.3 7.3  HGB 13.0 9.7* 13.2  HCT 39.1 30* 40.1  MCV 93.3  --  93.7  PLT 324 430* 357   Lipid No results for input(s): CHOL, HDL, LDLCALC, TRIG in the last 8760 hours.  Cardiac Enzymes: No results for input(s): CKTOTAL, CKMB, CKMBINDEX, TROPONINI in the last 8760 hours. BNP: No results for input(s): BNP in the last 8760 hours. No results found for: [facility] Lab Results  Component Value Date   HGBA1C 6.6 (H) 10/25/2015   Lab Results  Component Value Date   TSH 1.223 10/25/2015   No results found for: VITAMINB12 No results found for: FOLATE No results found for: IRON, TIBC, FERRITIN  Imaging and Procedures obtained prior to SNF admission: No results found.   Not all labs, radiology exams or other studies done during hospitalization come through on my EPIC note; however they are reviewed by me.    Assessment and Plan  AND STAGE OSTIA ARTHRITIS RIGHT KNEE/STATUS post RIGHT KNEE ARTHROPLASTY-no complications prophylaxed with some relative no admission to skilled nursing for OT PT SNF - patient admitted to skilled nursing facility for OT/PT; will continue prophylaxis was well toe 10 mg daily, then medication will be oxycodone and muscle relaxer  Robaxin  ABLA POSTOP-no postop hemoglobin recorded; preop hemoglobin 13.2 SNF - will follow-up CBC  HYPERTENSION SNF - controlled; plan to continue hydrochlorothiazide 12.5 mg by mouth daily and metoprolol 25 mg by mouth twice a day  HYPOTHYROIDISM SNF - not stated as uncontrolled;: Continue Synthroid 112 g daily  CAD SNF - continue metoprolol 25 mg twice a day; patient was on ASA 81 mg daily, this  is on hold while patient is on 0-10 mg daily as prophylaxis for surgery  STATUS post STROKE SNF - normally patient is on aspirin 81 mg daily; that is been on hold since patient is now on Zaroxolyn 10 mg daily as prophylaxis; return back to ASA after 3-4 weeks   Time spent greater than 35 minutes;> 50% of time with patient was spent reviewing records, labs, tests and studies, counseling and developing plan of care  Webb Silversmith D. Sheppard Coil, MD

## 2017-08-19 ENCOUNTER — Encounter: Payer: Self-pay | Admitting: Internal Medicine

## 2017-08-19 DIAGNOSIS — D62 Acute posthemorrhagic anemia: Secondary | ICD-10-CM | POA: Insufficient documentation

## 2017-08-19 HISTORY — DX: Acute posthemorrhagic anemia: D62

## 2017-08-21 ENCOUNTER — Non-Acute Institutional Stay (SKILLED_NURSING_FACILITY): Payer: BLUE CROSS/BLUE SHIELD | Admitting: Internal Medicine

## 2017-08-21 ENCOUNTER — Encounter: Payer: Self-pay | Admitting: Internal Medicine

## 2017-08-21 DIAGNOSIS — Z96651 Presence of right artificial knee joint: Secondary | ICD-10-CM

## 2017-08-21 DIAGNOSIS — M1711 Unilateral primary osteoarthritis, right knee: Secondary | ICD-10-CM

## 2017-08-21 DIAGNOSIS — M1710 Unilateral primary osteoarthritis, unspecified knee: Secondary | ICD-10-CM

## 2017-08-21 DIAGNOSIS — I10 Essential (primary) hypertension: Secondary | ICD-10-CM

## 2017-08-21 DIAGNOSIS — E034 Atrophy of thyroid (acquired): Secondary | ICD-10-CM

## 2017-08-21 DIAGNOSIS — D62 Acute posthemorrhagic anemia: Secondary | ICD-10-CM

## 2017-08-21 DIAGNOSIS — D649 Anemia, unspecified: Secondary | ICD-10-CM | POA: Diagnosis not present

## 2017-08-21 DIAGNOSIS — I2510 Atherosclerotic heart disease of native coronary artery without angina pectoris: Secondary | ICD-10-CM

## 2017-08-21 DIAGNOSIS — I6300 Cerebral infarction due to thrombosis of unspecified precerebral artery: Secondary | ICD-10-CM

## 2017-08-21 NOTE — Progress Notes (Signed)
Location:  [place] Room Number: Mary Dalton:  SNF [phone]) Provider: Noah Delaine. Sheppard Coil, MD  PCP: Velna Hatchet, MD Patient Care Team: Velna Hatchet, MD as PCP - General (Internal Medicine) Marlou Sa, Tonna Corner, MD as Consulting Physician (Orthopedic Surgery)  Extended Emergency Contact Information Primary Emergency Contact: Raechel Chute [address] [address] [place] of [place] Phone: [phone] Relation: Son Secondary Emergency Contact: [place] of [place] Mobile Phone: [phone] Relation: Son  Allergies  Allergen Reactions  . No Known Allergies     Chief Complaint  Patient presents with  . Discharge Note    discharge from SNF to home    HPI:  61 y.o. female  with hypertension, CAD, status post stroke, hyperlipidemia, hypothyroidism, GERD who was admitted to [facility] from 9/11-13 for total right knee arthroplasty secondary to end-stage osteoarthritis. There were no complications and patient was started on Zarrella toe for DVT prophylaxis. Patient was admitted to skilled nursing facility for OT/PT and is now ready to be discharged to home.    Past Medical History:  Diagnosis Date  . Acute blood loss as cause of postoperative anemia 08/19/2017  . Arthritis    "knees" (08/14/2017)  . CAD (coronary artery disease), native coronary artery 10/2015   diffuse disease, but non obstructive  . Elevated troponin 10/24/2015  . Gastroesophageal reflux disease without esophagitis   . GERD (gastroesophageal reflux disease)   . Gout   . High cholesterol   . History of non-ST elevation myocardial infarction (NSTEMI) 10/24/2015  . History of total knee arthroplasty 03/27/2017   right  . Hypertension   . Hypothyroidism   . Metabolic syndrome   . Primary osteoarthritis of right knee 01/10/2017  . STEMI (ST elevation myocardial infarction) ([place]) "~ 10/2015"   Archie Endo 08/12/2017  . Stroke  [facility])    "dx'd in 10/2015 when I went in for my cardiac cath" (03/20/2017)    Past Surgical History:  Procedure Laterality Date  . CARDIAC CATHETERIZATION N/A 10/26/2015   Procedure: Left Heart Cath and Coronary Angiography;  Surgeon: Jettie Booze, MD;  Location: [place] CV LAB;  Service: Cardiovascular;  Laterality: N/A;  . JOINT REPLACEMENT    . LAPAROSCOPIC CHOLECYSTECTOMY    . SHOULDER ARTHROSCOPY WITH OPEN ROTATOR CUFF REPAIR Bilateral   . TOTAL KNEE ARTHROPLASTY Left 03/20/2017   Procedure: TOTAL KNEE ARTHROPLASTY;  Surgeon: Meredith Pel, MD;  Location: [place];  Service: Orthopedics;  Laterality: Left;  . TOTAL KNEE ARTHROPLASTY Right 08/14/2017  . TOTAL KNEE ARTHROPLASTY Right 08/14/2017   Procedure: RIGHT TOTAL KNEE ARTHROPLASTY;  Surgeon: Meredith Pel, MD;  Location: [place];  Service: Orthopedics;  Laterality: Right;  . TUBAL LIGATION       reports that she has never smoked. She has never used smokeless tobacco. She reports that she does not drink alcohol or use drugs. Social History   Social History  . Marital status: Widowed    Spouse name: N/A  . Number of children: N/A  . 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 Smoking status: Never Smoker  . Smokeless tobacco: Never Used  . Alcohol use No  . Drug use: No  . Sexual activity: No   Other Topics Concern  . Not on file   Social History Narrative   Admitted to [facility] 08/16/18   Widowed   Never smoked   Alcohol none   Full Code    Pertinent  Health Maintenance Due  Topic Date Due  . PAP SMEAR  04/13/1975  . COLONOSCOPY  04/12/2004  . INFLUENZA VACCINE  07/04/2017  . MAMMOGRAM  06/05/2019    Medications: Allergies as of 08/21/2017      Reactions   No Known Allergies       Medication List       Accurate as of 08/21/17 11:59 PM. Always use your most recent med list.          docusate sodium 100 MG  capsule Commonly known as:  COLACE Take 1 capsule (100 mg total) by mouth 2 (two) times daily.   hydrochlorothiazide 12.5 MG capsule Commonly known as:  MICROZIDE Take 1 capsule (12.5 mg total) by mouth daily.   levothyroxine 112 MCG tablet Commonly known as:  SYNTHROID, LEVOTHROID Take 112 mcg by mouth daily before breakfast.   methocarbamol 500 MG tablet Commonly known as:  ROBAXIN Take 1 tablet (500 mg total) by mouth every 6 (six) hours as needed for muscle spasms.   metoprolol tartrate 25 MG tablet Commonly known as:  LOPRESSOR Take 1 tablet (25 mg total) by mouth 2 (two) times daily.   oxyCODONE 5 MG immediate release tablet Commonly known as:  Oxy IR/ROXICODONE Take 5 mg by mouth. Take one tablet every 4 hours as needed for moderate pain. Take two tablets as needed for severe pain   rivaroxaban 10 MG Tabs tablet Commonly known as:  XARELTO Take 1 tablet (10 mg total) by mouth daily with breakfast.        Vitals:   08/21/17 0852  BP: 135/61  Pulse: 65  Resp: 16  Temp: 99.3 F (37.4 C)  SpO2: 97%  Weight: 243 lb (110.2 kg)  Height: 5\' 6"  (1.676 m)   Body mass index is 39.22 kg/m.  Physical Exam  GENERAL APPEARANCE: Alert, conversant. No acute distress.  HEENT: Unremarkable. RESPIRATORY: Breathing is even, unlabored. Lung sounds are clear   CARDIOVASCULAR: Heart RRR no murmurs, rubs or gallops. No peripheral edema.  GASTROINTESTINAL: Abdomen is soft, non-tender, not distended w/ normal bowel sounds.  NEUROLOGIC: Cranial nerves 2-12 grossly intact. Moves all extremities   Labs reviewed: Basic Metabolic Panel:  Recent Labs  03/12/17 1043 03/28/17 08/02/17 1126  NA 139 139 139  K 3.8 4.7 3.8  CL 108  --  106  CO2 25  --  25  GLUCOSE 115*  --  98  BUN 11 16 13   CREATININE 0.69 0.6 0.72  CALCIUM 9.0  --  9.1   No results found for: [facility] Liver Function Tests: No results for input(s): AST, ALT, ALKPHOS, BILITOT, PROT, ALBUMIN in the last  8760 hours. No results for input(s): LIPASE, AMYLASE in the last 8760 hours. No results for input(s): AMMONIA in the last 8760 hours. CBC:  Recent Labs  03/12/17 1043 03/28/17 08/02/17 1126  WBC 6.5 10.3 7.3  HGB 13.0 9.7* 13.2  HCT 39.1 30* 40.1  MCV 93.3  --  93.7  PLT 324 430* 357   Lipid No results for input(s): CHOL, HDL, LDLCALC, TRIG in the last 8760 hours. Cardiac Enzymes: No results for input(s): CKTOTAL, CKMB, CKMBINDEX, TROPONINI in the last 8760 hours. BNP: No results for input(s): BNP in the last 8760 hours. CBG: No results for input(s): GLUCAP in the last 8760 hours.  Procedures and Imaging Studies During Stay: Xr Knee  1-2 Views Right  Result Date: 08/27/2017 AP lateral right knee reviewed.  Posterior cruciate sacrificing total knee prosthesis in good position and alignment with no complicating features.  Patella height normal in relation to the distal femur.   Assessment/Plan:   Primary osteoarthritis of right knee  Status post total right knee replacement  Acute blood loss as cause of postoperative anemia  Arthritis of knee  Coronary artery disease involving native coronary artery of native heart without angina pectoris  Hypothyroidism due to acquired atrophy of thyroid  Cerebrovascular accident (CVA) due to thrombosis of precerebral artery ([place])  Essential hypertension   Patient is being discharged with the following home health services:  OT/PT/nursing  Patient is being discharged with the following durable medical equipment:     Patient has been advised to f/u with their PCP in 1-2 weeks to bring them up to date on their rehab stay.  Social services at facility was responsible for arranging this appointment.  Pt was provided with a 30 day supply of prescriptions for medications and refills must be obtained from their PCP.  For controlled substances, a more limited supply may be provided adequate until PCP appointment only.  Medications have  been reconciled.   Time spent greater than 30 minutes;> 50% of time with patient was spent reviewing records, labs, tests and studies, counseling and developing plan of care  Noah Delaine. Sheppard Coil, MD

## 2017-08-25 DIAGNOSIS — E8881 Metabolic syndrome: Secondary | ICD-10-CM | POA: Diagnosis not present

## 2017-08-25 DIAGNOSIS — M109 Gout, unspecified: Secondary | ICD-10-CM | POA: Diagnosis not present

## 2017-08-25 DIAGNOSIS — I252 Old myocardial infarction: Secondary | ICD-10-CM | POA: Diagnosis not present

## 2017-08-25 DIAGNOSIS — Z96651 Presence of right artificial knee joint: Secondary | ICD-10-CM | POA: Diagnosis not present

## 2017-08-25 DIAGNOSIS — K219 Gastro-esophageal reflux disease without esophagitis: Secondary | ICD-10-CM | POA: Diagnosis not present

## 2017-08-25 DIAGNOSIS — Z8673 Personal history of transient ischemic attack (TIA), and cerebral infarction without residual deficits: Secondary | ICD-10-CM | POA: Diagnosis not present

## 2017-08-25 DIAGNOSIS — I10 Essential (primary) hypertension: Secondary | ICD-10-CM | POA: Diagnosis not present

## 2017-08-25 DIAGNOSIS — E785 Hyperlipidemia, unspecified: Secondary | ICD-10-CM | POA: Diagnosis not present

## 2017-08-25 DIAGNOSIS — Z7901 Long term (current) use of anticoagulants: Secondary | ICD-10-CM | POA: Diagnosis not present

## 2017-08-25 DIAGNOSIS — Z96652 Presence of left artificial knee joint: Secondary | ICD-10-CM | POA: Diagnosis not present

## 2017-08-25 DIAGNOSIS — E039 Hypothyroidism, unspecified: Secondary | ICD-10-CM | POA: Diagnosis not present

## 2017-08-25 DIAGNOSIS — I2510 Atherosclerotic heart disease of native coronary artery without angina pectoris: Secondary | ICD-10-CM | POA: Diagnosis not present

## 2017-08-25 DIAGNOSIS — Z471 Aftercare following joint replacement surgery: Secondary | ICD-10-CM | POA: Diagnosis not present

## 2017-08-26 DIAGNOSIS — E039 Hypothyroidism, unspecified: Secondary | ICD-10-CM | POA: Diagnosis not present

## 2017-08-26 DIAGNOSIS — Z96651 Presence of right artificial knee joint: Secondary | ICD-10-CM | POA: Diagnosis not present

## 2017-08-26 DIAGNOSIS — K219 Gastro-esophageal reflux disease without esophagitis: Secondary | ICD-10-CM | POA: Diagnosis not present

## 2017-08-26 DIAGNOSIS — I10 Essential (primary) hypertension: Secondary | ICD-10-CM | POA: Diagnosis not present

## 2017-08-26 DIAGNOSIS — M109 Gout, unspecified: Secondary | ICD-10-CM | POA: Diagnosis not present

## 2017-08-26 DIAGNOSIS — E8881 Metabolic syndrome: Secondary | ICD-10-CM | POA: Diagnosis not present

## 2017-08-26 DIAGNOSIS — Z471 Aftercare following joint replacement surgery: Secondary | ICD-10-CM | POA: Diagnosis not present

## 2017-08-26 DIAGNOSIS — I2510 Atherosclerotic heart disease of native coronary artery without angina pectoris: Secondary | ICD-10-CM | POA: Diagnosis not present

## 2017-08-26 DIAGNOSIS — Z96652 Presence of left artificial knee joint: Secondary | ICD-10-CM | POA: Diagnosis not present

## 2017-08-26 DIAGNOSIS — Z7901 Long term (current) use of anticoagulants: Secondary | ICD-10-CM | POA: Diagnosis not present

## 2017-08-26 DIAGNOSIS — E785 Hyperlipidemia, unspecified: Secondary | ICD-10-CM | POA: Diagnosis not present

## 2017-08-26 DIAGNOSIS — I252 Old myocardial infarction: Secondary | ICD-10-CM | POA: Diagnosis not present

## 2017-08-26 DIAGNOSIS — Z8673 Personal history of transient ischemic attack (TIA), and cerebral infarction without residual deficits: Secondary | ICD-10-CM | POA: Diagnosis not present

## 2017-08-27 ENCOUNTER — Ambulatory Visit (INDEPENDENT_AMBULATORY_CARE_PROVIDER_SITE_OTHER): Payer: BLUE CROSS/BLUE SHIELD

## 2017-08-27 ENCOUNTER — Telehealth (INDEPENDENT_AMBULATORY_CARE_PROVIDER_SITE_OTHER): Payer: Self-pay

## 2017-08-27 ENCOUNTER — Ambulatory Visit (INDEPENDENT_AMBULATORY_CARE_PROVIDER_SITE_OTHER): Payer: BLUE CROSS/BLUE SHIELD | Admitting: Orthopedic Surgery

## 2017-08-27 ENCOUNTER — Encounter (INDEPENDENT_AMBULATORY_CARE_PROVIDER_SITE_OTHER): Payer: Self-pay | Admitting: Orthopedic Surgery

## 2017-08-27 DIAGNOSIS — E785 Hyperlipidemia, unspecified: Secondary | ICD-10-CM | POA: Diagnosis not present

## 2017-08-27 DIAGNOSIS — Z96651 Presence of right artificial knee joint: Secondary | ICD-10-CM

## 2017-08-27 DIAGNOSIS — Z8673 Personal history of transient ischemic attack (TIA), and cerebral infarction without residual deficits: Secondary | ICD-10-CM | POA: Diagnosis not present

## 2017-08-27 DIAGNOSIS — Z7901 Long term (current) use of anticoagulants: Secondary | ICD-10-CM | POA: Diagnosis not present

## 2017-08-27 DIAGNOSIS — I252 Old myocardial infarction: Secondary | ICD-10-CM | POA: Diagnosis not present

## 2017-08-27 DIAGNOSIS — M109 Gout, unspecified: Secondary | ICD-10-CM | POA: Diagnosis not present

## 2017-08-27 DIAGNOSIS — I2510 Atherosclerotic heart disease of native coronary artery without angina pectoris: Secondary | ICD-10-CM | POA: Diagnosis not present

## 2017-08-27 DIAGNOSIS — Z96652 Presence of left artificial knee joint: Secondary | ICD-10-CM | POA: Diagnosis not present

## 2017-08-27 DIAGNOSIS — K219 Gastro-esophageal reflux disease without esophagitis: Secondary | ICD-10-CM | POA: Diagnosis not present

## 2017-08-27 DIAGNOSIS — Z471 Aftercare following joint replacement surgery: Secondary | ICD-10-CM | POA: Diagnosis not present

## 2017-08-27 DIAGNOSIS — I10 Essential (primary) hypertension: Secondary | ICD-10-CM | POA: Diagnosis not present

## 2017-08-27 DIAGNOSIS — E8881 Metabolic syndrome: Secondary | ICD-10-CM | POA: Diagnosis not present

## 2017-08-27 DIAGNOSIS — E039 Hypothyroidism, unspecified: Secondary | ICD-10-CM | POA: Diagnosis not present

## 2017-08-27 NOTE — Telephone Encounter (Signed)
Hhjpt firsr

## 2017-08-27 NOTE — Telephone Encounter (Signed)
IC advised outpt P.T. Per Dr Marlou Sa.

## 2017-08-27 NOTE — Brief Op Note (Signed)
08/14/2017  4:13 PM  PATIENT:  Mary Dalton  61 y.o. female  PRE-OPERATIVE DIAGNOSIS:  RIGHT KNEE OSTEOARTHRITIS  POST-OPERATIVE DIAGNOSIS:  RIGHT KNEE OSTEOARTHRITIS  PROCEDURE:  Procedure(s): RIGHT TOTAL KNEE ARTHROPLASTY  SURGEON:  Surgeon(s): Marlou Sa, Tonna Corner, MD  ASSISTANT: Modena Slater rnfa  ANESTHESIA:   general  EBL: 75 ml    No intake/output data recorded.  BLOOD ADMINISTERED: none  DRAINS: none   LOCAL MEDICATIONS USED:  Marcaine morphine clonidine and exparel SPECIMEN:  No Specimen  COUNTS:  YES  TOURNIQUET:   Total Tourniquet Time Documented: Thigh (Right) - 92 minutes Total: Thigh (Right) - 92 minutes   DICTATION: .Other Dictation: Dictation Number [id number]  PLAN OF CARE: Admit to inpatient   PATIENT DISPOSITION:  PACU - hemodynamically stable

## 2017-08-27 NOTE — Addendum Note (Signed)
Addended byLaurann Montana on: 08/27/2017 02:33 PM   Modules accepted: Orders

## 2017-08-27 NOTE — Progress Notes (Signed)
Post-Op Visit Note   Patient: Mary Dalton           Date of Birth: 1956-08-25           MRN: [id number] Visit Date: 08/27/2017 PCP: Velna Hatchet, MD   Assessment & Plan:  Chief Complaint:  Chief Complaint  Patient presents with  . Right Knee - Follow-up, Routine Post Op   Visit Diagnoses:  1. Status post right knee replacement     Plan: [place] is now 2 weeks out right total knee replacement.  She's been doy 10 of full extension but has 95 of flexion.  Radiographs looked reasonable.  Slight varus alignment of the tibial component compared to the left knee.  Everything looks good with the knee. Plan is [place] to outpatientPT.  Refill pain medicine.  4 week return.  I do want her to work on full extension.  Follow-Up Instructions: Return in about 4 weeks (around 09/24/2017).   Orders:  Orders Placed This Encounter  Procedures  . XR Knee 1-2 Views Right   No orders of the defined types were placed in this encounter.   Imaging: Xr Knee 1-2 Views Right  Result Date: 08/27/2017 AP lateral right knee reviewed.  Posterior cruciate sacrificing total knee prosthesis in good position and alignment with no complicating features.  Patella height normal in relation to the distal femur.   PMFS History: Patient Active Problem List   Diagnosis Date Noted  . Acute blood loss as cause of postoperative anemia 08/19/2017  . Presence of left artificial knee joint 05/02/2017  . History of total knee arthroplasty 03/27/2017  . Arthritis of knee 03/20/2017  . Chronic pain of right knee 01/10/2017  . Primary osteoarthritis of right knee 01/10/2017  . Pain in right foot 01/10/2017  . Primary osteoarthritis of left knee 01/10/2017  . Hyperlipidemia 11/09/2015  . Essential hypertension   . Chest pain 10/24/2015  . History of non-ST elevation myocardial infarction (NSTEMI) 10/24/2015  . Diarrhea 10/24/2015  . Elevated troponin 10/24/2015  . Hypothyroidism 10/24/2015  . Stroke  ([place])   . Hypertensive urgency   . GERD (gastroesophageal reflux disease)   . Gastroesophageal reflux disease without esophagitis   . CAD (coronary artery disease), native coronary artery 10/05/2015   Past Medical History:  Diagnosis Date  . Acute blood loss as cause of postoperative anemia 08/19/2017  . Arthritis    "knees" (08/14/2017)  . CAD (coronary artery disease), native coronary artery 10/2015   diffuse disease, but non obstructive  . Elevated troponin 10/24/2015  . Gastroesophageal reflux disease without esophagitis   . GERD (gastroesophageal reflux disease)   . Gout   . High cholesterol   . History of non-ST elevation myocardial infarction (NSTEMI) 10/24/2015  . History of total knee arthroplasty 03/27/2017   right  . Hypertension   . Hypothyroidism   . Metabolic syndrome   . Primary osteoarthritis of right knee 01/10/2017  . STEMI (ST elevation myocardial infarction) ([place]) "~ 10/2015"   Archie Endo 08/12/2017  . Stroke [facility])    "dx'd in 10/2015 when I went in for my cardiac cath" (03/20/2017)    Family History  Problem Relation Age of Onset  . Healthy Mother   . Diabetes Mother   . Healthy Father   . Healthy Brother   . Healthy Brother   . Healthy Brother     Past Surgical History:  Procedure Laterality Date  . CARDIAC CATHETERIZATION N/A 10/26/2015   Procedure: Left Heart Cath and Coronary  Angiography;  Surgeon: Jettie Booze, MD;  Location: [place] CV LAB;  Service: Cardiovascular;  Laterality: N/A;  . JOINT REPLACEMENT    . LAPAROSCOPIC CHOLECYSTECTOMY    . SHOULDER ARTHROSCOPY WITH OPEN ROTATOR CUFF REPAIR Bilateral   . TOTAL KNEE ARTHROPLASTY Left 03/20/2017   Procedure: TOTAL KNEE ARTHROPLASTY;  Surgeon: Meredith Pel, MD;  Location: [place];  Service: Orthopedics;  Laterality: Left;  . TOTAL KNEE ARTHROPLASTY Right 08/14/2017  . TOTAL KNEE ARTHROPLASTY Right 08/14/2017   Procedure: RIGHT TOTAL KNEE ARTHROPLASTY;  Surgeon: Meredith Pel, MD;   Location: [place];  Service: Orthopedics;  Laterality: Right;  . 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 Smoking status: Never Smoker  . Smokeless tobacco: Never Used  . Alcohol use No  . Drug use: No  . Sexual activity: No

## 2017-08-27 NOTE — Telephone Encounter (Signed)
Mary Dalton from [place] at home called. Patient discharged from nursing facility Saturday. She wanted to know if your plan was to send patient to outpatient or were you ok with her getting HHPT prior? Please advise. Thanks.  Contact for [facility]

## 2017-08-28 NOTE — Op Note (Signed)
NAME:  Mary Dalton, Mary Dalton                  ACCOUNT NO.:  MEDICAL RECORD NO.:  [id number]  LOCATION:                                 FACILITY:  PHYSICIAN:  Anderson Malta, M.D.    DATE OF BIRTH:  1956-03-12  DATE OF PROCEDURE: DATE OF DISCHARGE:                              OPERATIVE REPORT   PREOPERATIVE DIAGNOSIS:  Right knee arthritis.  POSTOPERATIVE DIAGNOSIS:  Right knee arthritis.  PROCEDURE:  Right total knee replacement using Stryker cemented posterior cruciate sacrificing 4 femur, 5 tibia, 50-mm stem with 11-mm poly, 32-mm 3-peg cemented patella.  SURGEON:  Anderson Malta, M.D.  ASSISTANT:  Laure Kidney, RNFA.  INDICATIONS:  [name] is a 61 year old patient with right knee pain, presents for operative management.  She did well with her left total knee replacement performed earlier this year.  PROCEDURE IN DETAIL:  The patient was brought to the operating room, where appropriate anesthetic was induced.  Right leg was prescrubbed with alcohol and Betadine and allowed to air dry, prepped with DuraPrep solution and draped in sterile manner.  The leg was elevated, exsanguinated with the Esmarch wrap.  Tourniquet was inflated.  Time-out was called.  Ioban used to cover the operative field after sterile prepping and draping.  Anterior approach to knee was made.  Skin and subcutaneous tissue were sharply divided.  Median parapatellar approach was made and marked with a #1 Vicryl suture.  Soft tissue removed, anterior and distal femur.  Lateral patellofemoral ligament was released.  Minimal soft tissue dissection performed on the medial side. Did require some soft tissue release for balancing.  ACL, PCL released. Intramedullary alignment was used to make the cut on the tibia.  This was a 9-mm cut off the least affected tibial side.  The 8-mm cut was then made using intramedullary alignment on the distal femur.  Spacer was placed 9-mm and good extension was achieved.  Box cut was  made and the femur was sized to size 4.  Tibia was then keel punched and 50-mm stem was utilized.  Trial components placed.  The knee achieved full extension, excellent flexion with stability and varus-valgus stress. Patella then prepared cut bony surface using freehand technique.  A 32- mm 3-peg patella trial placed with restoration of normal patellar height of about 24-25 mm.  Patella had excellent tracking.  Knee was stable. At this time, trial components removed, thorough irrigation performed. Solution of Exparel, Marcaine injected into the capsule.  A tranexamic acid sponge was then allowed to sit in the joint for 3 minutes.  This was removed and then components were cemented into position with excess cement removed.  An 11-mm spacer was utilized because this allowed for full extension and excellent flexion with excellent patellar tracking using no-thumbs technique.  After cement hardening and excess removal had occurred, the tourniquet was released.  Bleeding points were encountered and controlled using electrocautery.  The parapatellar arthrotomy closed using #1 Vicryl suture followed by interrupted inverted 0 Vicryl suture, 2-0 Vicryl suture and a 3-0 Monocryl.  Steri-Strips and Aquacel dressing applied followed by bulky dressing and knee immobilizer.  The patient tolerated the procedure well without immediate complication,  transferred to the recovery room in stable condition.     Anderson Malta, M.D.     GSD/MEDQ  D:  08/27/2017  T:  08/28/2017  Job:  [id number]

## 2017-08-30 ENCOUNTER — Encounter: Payer: Self-pay | Admitting: Internal Medicine

## 2017-08-30 ENCOUNTER — Ambulatory Visit: Payer: BLUE CROSS/BLUE SHIELD | Attending: Orthopedic Surgery | Admitting: Physical Therapy

## 2017-08-30 ENCOUNTER — Encounter: Payer: Self-pay | Admitting: Physical Therapy

## 2017-08-30 DIAGNOSIS — M25561 Pain in right knee: Secondary | ICD-10-CM

## 2017-08-30 DIAGNOSIS — R2689 Other abnormalities of gait and mobility: Secondary | ICD-10-CM | POA: Diagnosis not present

## 2017-08-30 DIAGNOSIS — M25661 Stiffness of right knee, not elsewhere classified: Secondary | ICD-10-CM | POA: Diagnosis not present

## 2017-08-30 DIAGNOSIS — R600 Localized edema: Secondary | ICD-10-CM | POA: Diagnosis not present

## 2017-08-31 ENCOUNTER — Encounter: Payer: Self-pay | Admitting: Physical Therapy

## 2017-08-31 NOTE — Therapy (Signed)
[place], [place], [address] Phone: [phone]   Fax:  [phone]  Physical Therapy Treatment  Patient Details  Name: Mary Dalton MRN: [id number] Date of Birth: 08/03/1956 Referring Provider: Dr Meredith Pel   Encounter Date: 08/30/2017      PT End of Session - 08/31/17 0753    Visit Number 1   Number of Visits 16   Date for PT Re-Evaluation 10/26/17   Authorization Type BCBS   PT Start Time 1415   PT Stop Time 1458   PT Time Calculation (min) 43 min   Activity Tolerance Patient tolerated treatment well   Behavior During Therapy [facility] for tasks assessed/performed      Past Medical History:  Diagnosis Date  . Acute blood loss as cause of postoperative anemia 08/19/2017  . Arthritis    "knees" (08/14/2017)  . CAD (coronary artery disease), native coronary artery 10/2015   diffuse disease, but non obstructive  . Elevated troponin 10/24/2015  . Gastroesophageal reflux disease without esophagitis   . GERD (gastroesophageal reflux disease)   . Gout   . High cholesterol   . History of non-ST elevation myocardial infarction (NSTEMI) 10/24/2015  . History of total knee arthroplasty 03/27/2017   right  . Hypertension   . Hypothyroidism   . Metabolic syndrome   . Primary osteoarthritis of right knee 01/10/2017  . STEMI (ST elevation myocardial infarction) ([place]) "~ 10/2015"   Archie Endo 08/12/2017  . Stroke [facility])    "dx'd in 10/2015 when I went in for my cardiac cath" (03/20/2017)    Past Surgical History:  Procedure Laterality Date  . CARDIAC CATHETERIZATION N/A 10/26/2015   Procedure: Left Heart Cath and Coronary Angiography;  Surgeon: Jettie Booze, MD;  Location: [place] [facility];  Service: Cardiovascular;  Laterality: N/A;  . JOINT REPLACEMENT    . LAPAROSCOPIC CHOLECYSTECTOMY    . SHOULDER ARTHROSCOPY WITH OPEN ROTATOR CUFF REPAIR Bilateral   . TOTAL KNEE ARTHROPLASTY Left  03/20/2017   Procedure: TOTAL KNEE ARTHROPLASTY;  Surgeon: Meredith Pel, MD;  Location: [place];  Service: Orthopedics;  Laterality: Left;  . TOTAL KNEE ARTHROPLASTY Right 08/14/2017  . TOTAL KNEE ARTHROPLASTY Right 08/14/2017   Procedure: RIGHT TOTAL KNEE ARTHROPLASTY;  Surgeon: Meredith Pel, MD;  Location: [place];  Service: Orthopedics;  Laterality: Right;  . TUBAL LIGATION      There were no vitals filed for this visit.      Subjective Assessment - 08/30/17 1419    Subjective Patient had a right total knee replacemnt on 08/14/2017. She is currently using a walker for mobility. Her C/O sat this time is difficulty sleeping.    Limitations Standing;Walking   Diagnostic tests Nothing post op.    Patient Stated Goals Go on vacation    Currently in Pain? Yes   Pain Score 2   Pain can approach a 10/10 at night    Pain Location Knee   Pain Orientation Right   Pain Descriptors / Indicators Aching;Sharp  sometimes sharp    Pain Type Surgical pain   Pain Onset 1 to 4 weeks ago   Pain Frequency Intermittent   Aggravating Factors  sleeping    Pain Relieving Factors changing position    Effect of Pain on Daily Activities difficulty sleeping             [facility] PT Assessment - 08/31/17 0001      Assessment   Medical Diagnosis Right  TKA    Referring Provider Dr Meredith Pel    Onset Date/Surgical Date 08/14/17   Hand Dominance Right   Next MD Visit --  4 weeks    Prior Therapy rehab for 7 days     Precautions   Precautions Knee   Precaution Comments total knee percuations      Restrictions   Weight Bearing Restrictions No     Balance Screen   Has the patient fallen in the past 6 months No   Has the patient had a decrease in activity level because of a fear of falling?  No   Is the patient reluctant to leave their home because of a fear of falling?  No     Home Ecologist residence   Living Arrangements Children   Additional  Comments 2 steps to front door     Prior Function   Level of [place] Retired   Leisure Knees were preventing recreation      Cognition   Overall Cognitive Status Within Functional Limits for tasks assessed   Attention Focused   Focused Attention Appears intact   Memory Appears intact   Awareness Appears intact   Problem Solving Appears intact     Observation/Other Assessments   Focus on Therapeutic Outcomes (FOTO)  90% ability     Observation/Other Assessments-Edema    Edema Circumferential     Circumferential Edema   Circumferential - Right 48.5    Circumferential - Left  44.8      Sensation   Light Touch Appears Intact   Additional Comments denies parathesias      Coordination   Gross Motor Movements are Fluid and Coordinated Yes   Fine Motor Movements are Fluid and Coordinated Yes     AROM   Right Knee Extension 12   Right Knee Flexion 96     PROM   Right Knee Extension 0   Right Knee Flexion 121   Left Knee Extension 8   Left Knee Flexion 101     Strength   Overall Strength Comments left knee 5/5    Right Hip Flexion 4+/5   Right Hip ABduction 4+/5   Right Knee Flexion 4+/5   Right Knee Extension 4/5     Palpation   Palpation comment No unexpected tenderness to palpation      Ambulation/Gait   Gait Comments decreased single leg stance time on the right. Decreased hip flexion                      [facility] Adult PT Treatment/Exercise - 08/31/17 0001      Knee/Hip Exercises: Stretches   Knee: Self-Stretch Limitations heel slides green strap 5x 10s holds   Other Knee/Hip Stretches knee extension stretch 5x 10 sec hold      Knee/Hip Exercises: Supine   Quad Sets Limitations 5 sec hold x10    Straight Leg Raises Limitations 2x10 in availabel range      Cryotherapy   Number Minutes Cryotherapy 10 Minutes   Cryotherapy Location Knee   Type of Cryotherapy Ice pack     Manual Therapy   Manual Therapy Soft tissue  mobilization   Soft tissue mobilization posterior soft tissue release                 PT Education - 08/31/17 1154    Education provided Yes   Education Details Reviewed HEP; symptom mangement  Person(s) Educated Patient   Methods Explanation;Demonstration;Tactile cues;Verbal cues   Comprehension Verbalized understanding;Returned demonstration;Verbal cues required;Tactile cues required          PT Short Term Goals - 08/30/17 1609      PT SHORT TERM GOAL #1   Title Patient will demonstrate right knee PROM 0-120   Time 4   Period Weeks   Status New   Target Date 09/27/17     PT SHORT TERM GOAL #2   Title Patient will ambualte 300' with single point cane without increased pain    Time 4   Period Weeks   Status New     PT SHORT TERM GOAL #3   Title hip MMT grossly 4+/5 to indicate appropraite support to joint by surrounding soft tissue   Time 4   Period Weeks   Status New     PT SHORT TERM GOAL #4   Title Patient will be independent with inital HEP for strengthening and ROM    Time 4   Period Weeks   Status Achieved           PT Long Term Goals - 08/30/17 1610      PT LONG TERM GOAL #1   Title Patient will go up/down 8 steps with reciprocol pattern in order to ambualte in the community    Time 8   Period Weeks   Status New   Target Date 10/25/17     PT LONG TERM GOAL #2   Title Patient will ambualte 3000' without device with no increase in pain in order to go shopping.    Time 8   Period Weeks   Status New   Target Date 10/25/17     PT LONG TERM GOAL #3   Title Patient will demonstrate a 44% limitation on FOTO    Time 8   Period Weeks   Status New               Plan - 08/31/17 0755    Clinical Impression Statement Patient is a 61 year old female S/P right TKA on 08/14/2017. She presents with decreased expected limitations in knee ROM, strength, and increased swelling. She has an antalgic gait. She is curently using a walker. She  would benefit from skilled therapy to improve the aobove defcits and to return to prior level of function or better. She was seen for a low complexity evalauation.    History and Personal Factors relevant to plan of care: right knee OA    Clinical Presentation Stable   Clinical Decision Making Low   Rehab Potential Good   PT Frequency 2x / week   PT Duration 8 weeks   PT Treatment/Interventions ADLs/Self Care Home Management;Cryotherapy;Electrical Stimulation;Iontophoresis 4mg /ml Dexamethasone;Functional mobility training;Stair training;Gait training;Ultrasound;Moist Heat;Therapeutic activities;Therapeutic exercise;Balance training;Neuromuscular re-education;Patient/family education;Passive range of motion;Scar mobilization;Manual techniques;Taping;Dry needling   PT Next Visit Plan PROM into flexion and extension, patellar mobilization, SLR, qaud sets, heel slide, SAQ; standing weight shift; begin cane training when aable    PT Home Exercise Plan quad set; SLR; knee flexion stretch with strap; knee extension stretch with towel    Consulted and Agree with Plan of Care Patient      Patient will benefit from skilled therapeutic intervention in order to improve the following deficits and impairments:  Abnormal gait, Decreased range of motion, Difficulty walking, Increased muscle spasms, Decreased activity tolerance, Pain, Improper body mechanics, Impaired flexibility, Decreased strength, Decreased mobility, Increased edema, Decreased endurance  Visit Diagnosis: Acute pain  of right knee - Plan: PT plan of care cert/re-cert  Stiffness of right knee, not elsewhere classified - Plan: PT plan of care cert/re-cert  Other abnormalities of gait and mobility - Plan: PT plan of care cert/re-cert  Localized edema - Plan: PT plan of care cert/re-cert     Problem List Patient Active Problem List   Diagnosis Date Noted  . Acute blood loss as cause of postoperative anemia 08/19/2017  . Presence of left  artificial knee joint 05/02/2017  . S/P total knee arthroplasty 03/27/2017  . Arthritis of knee 03/20/2017  . Chronic pain of right knee 01/10/2017  . Primary osteoarthritis of right knee 01/10/2017  . Pain in right foot 01/10/2017  . Primary osteoarthritis of left knee 01/10/2017  . Hyperlipidemia 11/09/2015  . Essential hypertension   . Chest pain 10/24/2015  . History of non-ST elevation myocardial infarction (NSTEMI) 10/24/2015  . Diarrhea 10/24/2015  . Elevated troponin 10/24/2015  . Hypothyroidism 10/24/2015  . Stroke ([place])   . Hypertensive urgency   . GERD (gastroesophageal reflux disease)   . Gastroesophageal reflux disease without esophagitis   . CAD (coronary artery disease), native coronary artery 10/05/2015    Carney Living PT DPT  08/31/2017, 12:06 PM  [facility] [address] [place], [place], [address] Phone: [phone]   Fax:  [phone]  Name: Mary Dalton MRN: [id number] Date of Birth: [birth year]

## 2017-09-06 ENCOUNTER — Ambulatory Visit: Payer: BLUE CROSS/BLUE SHIELD | Attending: Orthopedic Surgery

## 2017-09-06 DIAGNOSIS — M25661 Stiffness of right knee, not elsewhere classified: Secondary | ICD-10-CM | POA: Diagnosis not present

## 2017-09-06 DIAGNOSIS — R2689 Other abnormalities of gait and mobility: Secondary | ICD-10-CM

## 2017-09-06 DIAGNOSIS — M25561 Pain in right knee: Secondary | ICD-10-CM

## 2017-09-06 DIAGNOSIS — R600 Localized edema: Secondary | ICD-10-CM

## 2017-09-06 NOTE — Therapy (Signed)
[place], [place], [address] Phone: [phone]   Fax:  [phone]  Physical Therapy Treatment  Patient Details  Name: Mary Dalton MRN: [id number] Date of Birth: [birth year] Referring Provider: Dr Meredith Pel   Encounter Date: 09/06/2017      PT End of Session - 09/06/17 1102    Visit Number 2   Number of Visits 16   Date for PT Re-Evaluation 10/26/17   Authorization Type BCBS   PT Start Time 1103   PT Stop Time 1145   PT Time Calculation (min) 42 min   Equipment Utilized During Treatment Gait belt   Activity Tolerance Patient tolerated treatment well   Behavior During Therapy WFL for tasks assessed/performed      Past Medical History:  Diagnosis Date  . Acute blood loss as cause of postoperative anemia 08/19/2017  . Arthritis    "knees" (08/14/2017)  . CAD (coronary artery disease), native coronary artery 10/2015   diffuse disease, but non obstructive  . Elevated troponin 10/24/2015  . Gastroesophageal reflux disease without esophagitis   . GERD (gastroesophageal reflux disease)   . Gout   . High cholesterol   . History of non-ST elevation myocardial infarction (NSTEMI) 10/24/2015  . History of total knee arthroplasty 03/27/2017   right  . Hypertension   . Hypothyroidism   . Metabolic syndrome   . Primary osteoarthritis of right knee 01/10/2017  . STEMI (ST elevation myocardial infarction) ([place]) "~ 10/2015"   Archie Endo 08/12/2017  . Stroke [facility])    "dx'd in 10/2015 when I went in for my cardiac cath" (03/20/2017)    Past Surgical History:  Procedure Laterality Date  . CARDIAC CATHETERIZATION N/A 10/26/2015   Procedure: Left Heart Cath and Coronary Angiography;  Surgeon: Jettie Booze, MD;  Location: [place] [facility];  Service: Cardiovascular;  Laterality: N/A;  . JOINT REPLACEMENT    . LAPAROSCOPIC CHOLECYSTECTOMY    . SHOULDER ARTHROSCOPY WITH OPEN ROTATOR CUFF REPAIR  Bilateral   . TOTAL KNEE ARTHROPLASTY Left 03/20/2017   Procedure: TOTAL KNEE ARTHROPLASTY;  Surgeon: Meredith Pel, MD;  Location: [place];  Service: Orthopedics;  Laterality: Left;  . TOTAL KNEE ARTHROPLASTY Right 08/14/2017  . TOTAL KNEE ARTHROPLASTY Right 08/14/2017   Procedure: RIGHT TOTAL KNEE ARTHROPLASTY;  Surgeon: Meredith Pel, MD;  Location: [place];  Service: Orthopedics;  Laterality: Right;  . TUBAL LIGATION      There were no vitals filed for this visit.      Subjective Assessment - 09/06/17 1103    Subjective Its doing some better.  Stiff in AM . Sleep on sofa each night   Currently in Pain? No/denies   Pain Descriptors / Indicators Tightness            [facility] PT Assessment - 09/06/17 0001      AROM   Right Knee Extension 13   Right Knee Flexion 105                     [facility] Adult PT Treatment/Exercise - 09/06/17 0001      Ambulation/Gait   Ambulation Distance (Feet) 350 Feet   Assistive device None   Gait Pattern Step-through pattern   Ambulation Surface Level;Indoor   Gait Comments Belt used +1 contact guard, backward walking x 20 feet      Knee/Hip Exercises: Aerobic   Nustep LE only L3 8 min     Knee/Hip Exercises: Seated  Sit to Sand with UE support;10 reps  from mat     Knee/Hip Exercises: Supine   Quad Sets Right;1 set;10 reps   Quad Sets Limitations 5 sec   Short Arc Quad Sets Right;15 reps   Short Arc [employer] Limitations 3 pounds   Bridges Limitations x20 legs over small bolster   Straight Leg Raises Limitations SAQ to SLR x 15     Knee/Hip Exercises: Sidelying   Hip ABduction Right;3 sets;5 reps                  PT Short Term Goals - 09/06/17 1135      PT SHORT TERM GOAL #1   Title Patient will demonstrate right knee PROM 0-120   Baseline 105 degrees Rt today   Status On-going     PT SHORT TERM GOAL #2   Title Patient will ambualte 300' with single point cane without increased pain    Baseline  CGA   Status On-going     PT SHORT TERM GOAL #3   Title hip MMT grossly 4+/5 to indicate appropraite support to joint by surrounding soft tissue   Status On-going     PT SHORT TERM GOAL #4   Title Patient will be independent with inital HEP for strengthening and ROM    Status On-going           PT Long Term Goals - 08/30/17 1610      PT LONG TERM GOAL #1   Title Patient will go up/down 8 steps with reciprocol pattern in order to ambualte in the community    Time 8   Period Weeks   Status New   Target Date 10/25/17     PT LONG TERM GOAL #2   Title Patient will ambualte 3000' without device with no increase in pain in order to go shopping.    Time 8   Period Weeks   Status New   Target Date 10/25/17     PT LONG TERM GOAL #3   Title Patient will demonstrate a 44% limitation on FOTO    Time 8   Period Weeks   Status New               Plan - 09/06/17 1134    Clinical Impression Statement Active flexion ROM improved. tolerated session without incr pain.   she feels she is starting anew like after LT TKR earlier this year. declined ice pack .    PT Treatment/Interventions ADLs/Self Care Home Management;Cryotherapy;Electrical Stimulation;Iontophoresis 4mg /ml Dexamethasone;Functional mobility training;Stair training;Gait training;Ultrasound;Moist Heat;Therapeutic activities;Therapeutic exercise;Balance training;Neuromuscular re-education;Patient/family education;Passive range of motion;Scar mobilization;Manual techniques;Taping;Dry needling   PT Next Visit Plan PROM into flexion and extension, patellar mobilization, SLR, qaud sets, heel slide, SAQ; standing weight shift; begin cane training when able    PT Home Exercise Plan quad set; SLR; knee flexion stretch with strap; knee extension stretch with towel    Consulted and Agree with Plan of Care Patient      Patient will benefit from skilled therapeutic intervention in order to improve the following deficits and  impairments:  Abnormal gait, Decreased range of motion, Difficulty walking, Increased muscle spasms, Decreased activity tolerance, Pain, Improper body mechanics, Impaired flexibility, Decreased strength, Decreased mobility, Increased edema, Decreased endurance  Visit Diagnosis: Acute pain of right knee  Stiffness of right knee, not elsewhere classified  Other abnormalities of gait and mobility  Localized edema     Problem List Patient Active Problem List   Diagnosis Date  Noted  . Acute blood loss as cause of postoperative anemia 08/19/2017  . Presence of left artificial knee joint 05/02/2017  . S/P total knee arthroplasty 03/27/2017  . Arthritis of knee 03/20/2017  . Chronic pain of right knee 01/10/2017  . Primary osteoarthritis of right knee 01/10/2017  . Pain in right foot 01/10/2017  . Primary osteoarthritis of left knee 01/10/2017  . Hyperlipidemia 11/09/2015  . Essential hypertension   . Chest pain 10/24/2015  . History of non-ST elevation myocardial infarction (NSTEMI) 10/24/2015  . Diarrhea 10/24/2015  . Elevated troponin 10/24/2015  . Hypothyroidism 10/24/2015  . Stroke ([place])   . Hypertensive urgency   . GERD (gastroesophageal reflux disease)   . Gastroesophageal reflux disease without esophagitis   . CAD (coronary artery disease), native coronary artery 10/05/2015    Darrel Hoover  PT 09/06/2017, 11:44 AM  [facility] [address] [place], [place], [address] Phone: [phone]   Fax:  [phone]  Name: Mary Dalton MRN: [id number] Date of Birth: 10-18-1956

## 2017-09-07 ENCOUNTER — Ambulatory Visit: Payer: BLUE CROSS/BLUE SHIELD

## 2017-09-07 DIAGNOSIS — R2689 Other abnormalities of gait and mobility: Secondary | ICD-10-CM

## 2017-09-07 DIAGNOSIS — M25661 Stiffness of right knee, not elsewhere classified: Secondary | ICD-10-CM

## 2017-09-07 DIAGNOSIS — M25561 Pain in right knee: Secondary | ICD-10-CM | POA: Diagnosis not present

## 2017-09-07 DIAGNOSIS — R600 Localized edema: Secondary | ICD-10-CM | POA: Diagnosis not present

## 2017-09-07 NOTE — Therapy (Signed)
[place], [place], [address] Phone: [phone]   Fax:  [phone]  Physical Therapy Treatment  Patient Details  Name: Mary Dalton MRN: [id number] Date of Birth: November 23, 1956 Referring Provider: Dr Meredith Pel   Encounter Date: 09/07/2017      PT End of Session - 09/07/17 1005    Visit Number 3   Number of Visits 16   Date for PT Re-Evaluation 10/26/17   Authorization Type BCBS   PT Start Time 1005   PT Stop Time 1105   PT Time Calculation (min) 60 min   Activity Tolerance Patient tolerated treatment well   Behavior During Therapy [facility] for tasks assessed/performed      Past Medical History:  Diagnosis Date  . Acute blood loss as cause of postoperative anemia 08/19/2017  . Arthritis    "knees" (08/14/2017)  . CAD (coronary artery disease), native coronary artery 10/2015   diffuse disease, but non obstructive  . Elevated troponin 10/24/2015  . Gastroesophageal reflux disease without esophagitis   . GERD (gastroesophageal reflux disease)   . Gout   . High cholesterol   . History of non-ST elevation myocardial infarction (NSTEMI) 10/24/2015  . History of total knee arthroplasty 03/27/2017   right  . Hypertension   . Hypothyroidism   . Metabolic syndrome   . Primary osteoarthritis of right knee 01/10/2017  . STEMI (ST elevation myocardial infarction) ([place]) "~ 10/2015"   Archie Endo 08/12/2017  . Stroke [facility])    "dx'd in 10/2015 when I went in for my cardiac cath" (03/20/2017)    Past Surgical History:  Procedure Laterality Date  . CARDIAC CATHETERIZATION N/A 10/26/2015   Procedure: Left Heart Cath and Coronary Angiography;  Surgeon: Jettie Booze, MD;  Location: [place] [facility];  Service: Cardiovascular;  Laterality: N/A;  . JOINT REPLACEMENT    . LAPAROSCOPIC CHOLECYSTECTOMY    . SHOULDER ARTHROSCOPY WITH OPEN ROTATOR CUFF REPAIR Bilateral   . TOTAL KNEE ARTHROPLASTY Left  03/20/2017   Procedure: TOTAL KNEE ARTHROPLASTY;  Surgeon: Meredith Pel, MD;  Location: [place];  Service: Orthopedics;  Laterality: Left;  . TOTAL KNEE ARTHROPLASTY Right 08/14/2017  . TOTAL KNEE ARTHROPLASTY Right 08/14/2017   Procedure: RIGHT TOTAL KNEE ARTHROPLASTY;  Surgeon: Meredith Pel, MD;  Location: [place];  Service: Orthopedics;  Laterality: Right;  . TUBAL LIGATION      There were no vitals filed for this visit.      Subjective Assessment - 09/07/17 1008    Subjective Everything is fine   Currently in Pain? No/denies                         [facility] Adult PT Treatment/Exercise - 09/07/17 0001      Ambulation/Gait   Ambulation Distance (Feet) 350 Feet   Assistive device Straight cane   Gait Pattern Step-through pattern   Ambulation Surface Level;Indoor   Gait Comments SBA     Knee/Hip Exercises: Aerobic   Nustep LE only L4 6 min     Knee/Hip Exercises: Seated   Long Arc Quad Right;20 reps   Long Arc Quad Weight 3 lbs.     Knee/Hip Exercises: Supine   Quad Sets Right;10 reps;2 sets   [employer] Limitations 5 sec   Short Arc Quad Sets Right;2 sets;10 reps   Short Arc Quad Sets Limitations 5 #   Bridges Limitations x20 legs over large bolster  Knee/Hip Exercises: Sidelying   Hip ABduction Right;3 sets;5 reps     Modalities   Modalities Vasopneumatic     Vasopneumatic   Number Minutes Vasopneumatic  15 minutes   Vasopnuematic Location  Knee   Vasopneumatic Pressure Medium   Vasopneumatic Temperature  35     Manual Therapy   Manual Therapy Passive ROM   Soft tissue mobilization posterior soft tissue release  and quad retro STW   Passive ROM gentle passive flex and ext stretching                  PT Short Term Goals - 09/06/17 1135      PT SHORT TERM GOAL #1   Title Patient will demonstrate right knee PROM 0-120   Baseline 105 degrees Rt today   Status On-going     PT SHORT TERM GOAL #2   Title Patient will  ambualte 300' with single point cane without increased pain    Baseline CGA   Status On-going     PT SHORT TERM GOAL #3   Title hip MMT grossly 4+/5 to indicate appropraite support to joint by surrounding soft tissue   Status On-going     PT SHORT TERM GOAL #4   Title Patient will be independent with inital HEP for strengthening and ROM    Status On-going           PT Long Term Goals - 08/30/17 1610      PT LONG TERM GOAL #1   Title Patient will go up/down 8 steps with reciprocol pattern in order to ambualte in the community    Time 8   Period Weeks   Status New   Target Date 10/25/17     PT LONG TERM GOAL #2   Title Patient will ambualte 3000' without device with no increase in pain in order to go shopping.    Time 8   Period Weeks   Status New   Target Date 10/25/17     PT LONG TERM GOAL #3   Title Patient will demonstrate a 44% limitation on FOTO    Time 8   Period Weeks   Status New               Plan - 09/07/17 1009    Clinical Impression Statement Continues to do well with pain controlled . She used cane at home and felt comfortable yesterday.  Sore from stretching but otherwise doing well post session   PT Treatment/Interventions ADLs/Self Care Home Management;Cryotherapy;Electrical Stimulation;Iontophoresis 4mg /ml Dexamethasone;Functional mobility training;Stair training;Gait training;Ultrasound;Moist Heat;Therapeutic activities;Therapeutic exercise;Balance training;Neuromuscular re-education;Patient/family education;Passive range of motion;Scar mobilization;Manual techniques;Taping;Dry needling   PT Next Visit Plan PROM into flexion and extension, patellar mobilization, SLR, qaud sets, heel slide, SAQ; standing weight shift; begin cane training when able    PT Home Exercise Plan quad set; SLR; knee flexion stretch with strap; knee extension stretch with towel    Consulted and Agree with Plan of Care Patient      Patient will benefit from skilled  therapeutic intervention in order to improve the following deficits and impairments:  Abnormal gait, Decreased range of motion, Difficulty walking, Increased muscle spasms, Decreased activity tolerance, Pain, Improper body mechanics, Impaired flexibility, Decreased strength, Decreased mobility, Increased edema, Decreased endurance  Visit Diagnosis: Acute pain of right knee  Stiffness of right knee, not elsewhere classified  Other abnormalities of gait and mobility  Localized edema     Problem List Patient Active Problem List   Diagnosis  Date Noted  . Acute blood loss as cause of postoperative anemia 08/19/2017  . Presence of left artificial knee joint 05/02/2017  . S/P total knee arthroplasty 03/27/2017  . Arthritis of knee 03/20/2017  . Chronic pain of right knee 01/10/2017  . Primary osteoarthritis of right knee 01/10/2017  . Pain in right foot 01/10/2017  . Primary osteoarthritis of left knee 01/10/2017  . Hyperlipidemia 11/09/2015  . Essential hypertension   . Chest pain 10/24/2015  . History of non-ST elevation myocardial infarction (NSTEMI) 10/24/2015  . Diarrhea 10/24/2015  . Elevated troponin 10/24/2015  . Hypothyroidism 10/24/2015  . Stroke ([place])   . Hypertensive urgency   . GERD (gastroesophageal reflux disease)   . Gastroesophageal reflux disease without esophagitis   . CAD (coronary artery disease), native coronary artery 10/05/2015    Darrel Hoover  PT 09/07/2017, 10:57 AM  [facility] [address] [place], [place], [address] Phone: [phone]   Fax:  [phone]  Name: Mary Dalton MRN: [id number] Date of Birth: August 02, 1956

## 2017-09-11 ENCOUNTER — Ambulatory Visit: Payer: BLUE CROSS/BLUE SHIELD | Admitting: Physical Therapy

## 2017-09-11 ENCOUNTER — Encounter: Payer: Self-pay | Admitting: Physical Therapy

## 2017-09-11 DIAGNOSIS — R600 Localized edema: Secondary | ICD-10-CM | POA: Diagnosis not present

## 2017-09-11 DIAGNOSIS — M25661 Stiffness of right knee, not elsewhere classified: Secondary | ICD-10-CM

## 2017-09-11 DIAGNOSIS — M25561 Pain in right knee: Secondary | ICD-10-CM

## 2017-09-11 DIAGNOSIS — R2689 Other abnormalities of gait and mobility: Secondary | ICD-10-CM | POA: Diagnosis not present

## 2017-09-11 NOTE — Therapy (Signed)
[place], [place], [address] Phone: [phone]   Fax:  [phone]  Physical Therapy Treatment  Patient Details  Name: Mary Dalton MRN: [id number] Date of Birth: 1956-04-14 Referring Provider: Dr Meredith Pel   Encounter Date: 09/11/2017      PT End of Session - 09/11/17 1421    Visit Number 4   Number of Visits 16   Date for PT Re-Evaluation 10/26/17   Authorization Type BCBS   PT Start Time 7209   PT Stop Time 1505   PT Time Calculation (min) 48 min   Activity Tolerance Patient tolerated treatment well   Behavior During Therapy [facility] for tasks assessed/performed      Past Medical History:  Diagnosis Date  . Acute blood loss as cause of postoperative anemia 08/19/2017  . Arthritis    "knees" (08/14/2017)  . CAD (coronary artery disease), native coronary artery 10/2015   diffuse disease, but non obstructive  . Elevated troponin 10/24/2015  . Gastroesophageal reflux disease without esophagitis   . GERD (gastroesophageal reflux disease)   . Gout   . High cholesterol   . History of non-ST elevation myocardial infarction (NSTEMI) 10/24/2015  . History of total knee arthroplasty 03/27/2017   right  . Hypertension   . Hypothyroidism   . Metabolic syndrome   . Primary osteoarthritis of right knee 01/10/2017  . STEMI (ST elevation myocardial infarction) ([place]) "~ 10/2015"   Archie Endo 08/12/2017  . Stroke [facility])    "dx'd in 10/2015 when I went in for my cardiac cath" (03/20/2017)    Past Surgical History:  Procedure Laterality Date  . CARDIAC CATHETERIZATION N/A 10/26/2015   Procedure: Left Heart Cath and Coronary Angiography;  Surgeon: Jettie Booze, MD;  Location: [place] [facility];  Service: Cardiovascular;  Laterality: N/A;  . JOINT REPLACEMENT    . LAPAROSCOPIC CHOLECYSTECTOMY    . SHOULDER ARTHROSCOPY WITH OPEN ROTATOR CUFF REPAIR Bilateral   . TOTAL KNEE ARTHROPLASTY Left  03/20/2017   Procedure: TOTAL KNEE ARTHROPLASTY;  Surgeon: Meredith Pel, MD;  Location: [place];  Service: Orthopedics;  Laterality: Left;  . TOTAL KNEE ARTHROPLASTY Right 08/14/2017  . TOTAL KNEE ARTHROPLASTY Right 08/14/2017   Procedure: RIGHT TOTAL KNEE ARTHROPLASTY;  Surgeon: Meredith Pel, MD;  Location: [place];  Service: Orthopedics;  Laterality: Right;  . TUBAL LIGATION      There were no vitals filed for this visit.      Subjective Assessment - 09/11/17 1420    Subjective Knee feels stiff sometimes but it's coming along. Is using cane at home in left hand.    Patient Stated Goals Go on vacation    Currently in Pain? No/denies            [facility] PT Assessment - 09/11/17 0001      AROM   Right Knee Extension -7                     [facility] Adult PT Treatment/Exercise - 09/11/17 0001      Knee/Hip Exercises: Aerobic   Nustep Le only L5 5 min     Knee/Hip Exercises: Standing   Gait Training with SPC     Knee/Hip Exercises: Seated   Long Arc Quad 15 reps   Long Arc Quad Limitations 5s holds with slow loer   Other Seated Knee/Hip Exercises seated quad sets     Knee/Hip Exercises: Supine   Bridges with [employer]  Squeeze 15 reps   Straight Leg Raises 20 reps   Straight Leg Raises Limitations cues to reduce quad lag     Knee/Hip Exercises: Prone   Hip Extension Right;20 reps     Cryotherapy   Number Minutes Cryotherapy 10 Minutes   Cryotherapy Location Knee   Type of Cryotherapy Ice pack     Manual Therapy   Joint Mobilization patellar mobilization   Soft tissue mobilization scar mobilization; roller R HS in prone hang                  PT Short Term Goals - 09/06/17 1135      PT SHORT TERM GOAL #1   Title Patient will demonstrate right knee PROM 0-120   Baseline 105 degrees Rt today   Status On-going     PT SHORT TERM GOAL #2   Title Patient will ambualte 300' with single point cane without increased pain    Baseline CGA    Status On-going     PT SHORT TERM GOAL #3   Title hip MMT grossly 4+/5 to indicate appropraite support to joint by surrounding soft tissue   Status On-going     PT SHORT TERM GOAL #4   Title Patient will be independent with inital HEP for strengthening and ROM    Status On-going           PT Long Term Goals - 08/30/17 1610      PT LONG TERM GOAL #1   Title Patient will go up/down 8 steps with reciprocol pattern in order to ambualte in the community    Time 8   Period Weeks   Status New   Target Date 10/25/17     PT LONG TERM GOAL #2   Title Patient will ambualte 3000' without device with no increase in pain in order to go shopping.    Time 8   Period Weeks   Status New   Target Date 10/25/17     PT LONG TERM GOAL #3   Title Patient will demonstrate a 44% limitation on FOTO    Time 8   Period Weeks   Status New               Plan - 09/11/17 1444    Clinical Impression Statement Improved extension to -7 today but pt reports she has been sitting in her recliner frequently with legs elevated on foot lift rather than propping heel. Approx 3-4 deg quad lag in SLR. Gait with cane was more upright and demo good heel-toe pattern. I advised her to use walker if she feels the need or nervous about balance but otherwise move to use of cane full time.    PT Treatment/Interventions ADLs/Self Care Home Management;Cryotherapy;Electrical Stimulation;Iontophoresis 4mg /ml Dexamethasone;Functional mobility training;Stair training;Gait training;Ultrasound;Moist Heat;Therapeutic activities;Therapeutic exercise;Balance training;Neuromuscular re-education;Patient/family education;Passive range of motion;Scar mobilization;Manual techniques;Taping;Dry needling   PT Next Visit Plan PROM into flexion and extension, patellar mobilization, SLR, qaud sets, heel slide, SAQ; standing weight shift;   PT Home Exercise Plan quad set; SLR; knee flexion stretch with strap; knee extension stretch with  towel    Consulted and Agree with Plan of Care Patient      Patient will benefit from skilled therapeutic intervention in order to improve the following deficits and impairments:  Abnormal gait, Decreased range of motion, Difficulty walking, Increased muscle spasms, Decreased activity tolerance, Pain, Improper body mechanics, Impaired flexibility, Decreased strength, Decreased mobility, Increased edema, Decreased endurance  Visit Diagnosis: Acute pain  of right knee  Stiffness of right knee, not elsewhere classified     Problem List Patient Active Problem List   Diagnosis Date Noted  . Acute blood loss as cause of postoperative anemia 08/19/2017  . Presence of left artificial knee joint 05/02/2017  . S/P total knee arthroplasty 03/27/2017  . Arthritis of knee 03/20/2017  . Chronic pain of right knee 01/10/2017  . Primary osteoarthritis of right knee 01/10/2017  . Pain in right foot 01/10/2017  . Primary osteoarthritis of left knee 01/10/2017  . Hyperlipidemia 11/09/2015  . Essential hypertension   . Chest pain 10/24/2015  . History of non-ST elevation myocardial infarction (NSTEMI) 10/24/2015  . Diarrhea 10/24/2015  . Elevated troponin 10/24/2015  . Hypothyroidism 10/24/2015  . Stroke ([place])   . Hypertensive urgency   . GERD (gastroesophageal reflux disease)   . Gastroesophageal reflux disease without esophagitis   . CAD (coronary artery disease), native coronary artery 10/05/2015    Ciaira Natividad C. Jamelah Sitzer PT, DPT 09/11/17 2:56 PM   [facility] [address] [place], [place], [address] Phone: [phone]   Fax:  [phone]  Name: ELLISSA AYO MRN: [id number] Date of Birth: [birth year]

## 2017-09-13 ENCOUNTER — Encounter: Payer: BLUE CROSS/BLUE SHIELD | Admitting: Physical Therapy

## 2017-09-18 ENCOUNTER — Ambulatory Visit: Payer: BLUE CROSS/BLUE SHIELD | Admitting: Physical Therapy

## 2017-09-18 ENCOUNTER — Encounter: Payer: Self-pay | Admitting: Physical Therapy

## 2017-09-18 DIAGNOSIS — M25661 Stiffness of right knee, not elsewhere classified: Secondary | ICD-10-CM

## 2017-09-18 DIAGNOSIS — R2689 Other abnormalities of gait and mobility: Secondary | ICD-10-CM | POA: Diagnosis not present

## 2017-09-18 DIAGNOSIS — M25561 Pain in right knee: Secondary | ICD-10-CM | POA: Diagnosis not present

## 2017-09-18 DIAGNOSIS — R600 Localized edema: Secondary | ICD-10-CM | POA: Diagnosis not present

## 2017-09-18 NOTE — Therapy (Signed)
[place], [place], [address] Phone: [phone]   Fax:  [phone]  Physical Therapy Treatment  Patient Details  Name: Mary Dalton MRN: [id number] Date of Birth: [birth year] Referring Provider: Dr Meredith Pel   Encounter Date: 09/18/2017      PT End of Session - 09/18/17 1421    Visit Number 5   Number of Visits 16   Date for PT Re-Evaluation 10/26/17   Authorization Type BCBS   PT Start Time 3762   PT Stop Time 1454   PT Time Calculation (min) 39 min   Activity Tolerance Patient tolerated treatment well   Behavior During Therapy [facility] for tasks assessed/performed      Past Medical History:  Diagnosis Date  . Acute blood loss as cause of postoperative anemia 08/19/2017  . Arthritis    "knees" (08/14/2017)  . CAD (coronary artery disease), native coronary artery 10/2015   diffuse disease, but non obstructive  . Elevated troponin 10/24/2015  . Gastroesophageal reflux disease without esophagitis   . GERD (gastroesophageal reflux disease)   . Gout   . High cholesterol   . History of non-ST elevation myocardial infarction (NSTEMI) 10/24/2015  . History of total knee arthroplasty 03/27/2017   right  . Hypertension   . Hypothyroidism   . Metabolic syndrome   . Primary osteoarthritis of right knee 01/10/2017  . STEMI (ST elevation myocardial infarction) ([place]) "~ 10/2015"   Archie Endo 08/12/2017  . Stroke [facility])    "dx'd in 10/2015 when I went in for my cardiac cath" (03/20/2017)    Past Surgical History:  Procedure Laterality Date  . CARDIAC CATHETERIZATION N/A 10/26/2015   Procedure: Left Heart Cath and Coronary Angiography;  Surgeon: Jettie Booze, MD;  Location: [place] [facility];  Service: Cardiovascular;  Laterality: N/A;  . JOINT REPLACEMENT    . LAPAROSCOPIC CHOLECYSTECTOMY    . SHOULDER ARTHROSCOPY WITH OPEN ROTATOR CUFF REPAIR Bilateral   . TOTAL KNEE ARTHROPLASTY Left  03/20/2017   Procedure: TOTAL KNEE ARTHROPLASTY;  Surgeon: Meredith Pel, MD;  Location: [place];  Service: Orthopedics;  Laterality: Left;  . TOTAL KNEE ARTHROPLASTY Right 08/14/2017  . TOTAL KNEE ARTHROPLASTY Right 08/14/2017   Procedure: RIGHT TOTAL KNEE ARTHROPLASTY;  Surgeon: Meredith Pel, MD;  Location: [place];  Service: Orthopedics;  Laterality: Right;  . TUBAL LIGATION      There were no vitals filed for this visit.      Subjective Assessment - 09/18/17 1422    Subjective Mild pain after being stiff from laying or sitting down. Has had a little pain in R calf but not today. Had some increased swelling in ankles after being up a lot over the weekend but was able to put them up and bring the swelling down. Still having a little pinch over anteiror-medial aspect of knee joint. Walking with cane otside of home but no AD at home   Patient Stated Goals Go on vacation    Currently in Pain? No/denies            [facility] PT Assessment - 09/18/17 0001      AROM   Right Knee Extension -3   Right Knee Flexion 106  L 113                     [facility] Adult PT Treatment/Exercise - 09/18/17 0001      Knee/Hip Exercises: Stretches   Knee: Self-Stretch Limitations heel  slides green strap 5x 10s holds   Gastroc Stretch 2 reps;30 seconds   Gastroc Stretch Limitations slant board   Other Knee/Hip Stretches heel prop 2# 3 min     Knee/Hip Exercises: Aerobic   Nustep LE 5 min L5     Knee/Hip Exercises: Seated   Sit to Sand 10 reps  slow, eccentric sit     Knee/Hip Exercises: Supine   Straight Leg Raises 15 reps     Knee/Hip Exercises: Sidelying   Hip ABduction Right;20 reps;5 reps     Knee/Hip Exercises: Prone   Hamstring Curl 10 reps   Hip Extension 15 reps   Hip Extension Limitations toes curl under for knee ext to hip ext     Manual Therapy   Soft tissue mobilization IASTM around incision & anterior-medial knee joint                  PT Short  Term Goals - 09/06/17 1135      PT SHORT TERM GOAL #1   Title Patient will demonstrate right knee PROM 0-120   Baseline 105 degrees Rt today   Status On-going     PT SHORT TERM GOAL #2   Title Patient will ambualte 300' with single point cane without increased pain    Baseline CGA   Status On-going     PT SHORT TERM GOAL #3   Title hip MMT grossly 4+/5 to indicate appropraite support to joint by surrounding soft tissue   Status On-going     PT SHORT TERM GOAL #4   Title Patient will be independent with inital HEP for strengthening and ROM    Status On-going           PT Long Term Goals - 08/30/17 1610      PT LONG TERM GOAL #1   Title Patient will go up/down 8 steps with reciprocol pattern in order to ambualte in the community    Time 8   Period Weeks   Status New   Target Date 10/25/17     PT LONG TERM GOAL #2   Title Patient will ambualte 3000' without device with no increase in pain in order to go shopping.    Time 8   Period Weeks   Status New   Target Date 10/25/17     PT LONG TERM GOAL #3   Title Patient will demonstrate a 44% limitation on FOTO    Time 8   Period Weeks   Status New               Plan - 09/18/17 1455    Clinical Impression Statement Continues to make improvements in ROM, reporting she is working hard on it at home. Good balance with cane. Utilized IASTM to improve fasical mobility around knee joint which was tender but pt felt like it moved better following.    PT Treatment/Interventions ADLs/Self Care Home Management;Cryotherapy;Electrical Stimulation;Iontophoresis 4mg /ml Dexamethasone;Functional mobility training;Stair training;Gait training;Ultrasound;Moist Heat;Therapeutic activities;Therapeutic exercise;Balance training;Neuromuscular re-education;Patient/family education;Passive range of motion;Scar mobilization;Manual techniques;Taping;Dry needling   PT Next Visit Plan PROM into flexion and extension, patellar mobilization,  balance, CKC   PT Home Exercise Plan quad set; SLR; knee flexion stretch with strap; knee extension stretch with towel, heel prop with something on knee   Consulted and Agree with Plan of Care Patient      Patient will benefit from skilled therapeutic intervention in order to improve the following deficits and impairments:  Abnormal gait, Decreased range of motion,  Difficulty walking, Increased muscle spasms, Decreased activity tolerance, Pain, Improper body mechanics, Impaired flexibility, Decreased strength, Decreased mobility, Increased edema, Decreased endurance  Visit Diagnosis: Acute pain of right knee  Stiffness of right knee, not elsewhere classified     Problem List Patient Active Problem List   Diagnosis Date Noted  . Acute blood loss as cause of postoperative anemia 08/19/2017  . Presence of left artificial knee joint 05/02/2017  . S/P total knee arthroplasty 03/27/2017  . Arthritis of knee 03/20/2017  . Chronic pain of right knee 01/10/2017  . Primary osteoarthritis of right knee 01/10/2017  . Pain in right foot 01/10/2017  . Primary osteoarthritis of left knee 01/10/2017  . Hyperlipidemia 11/09/2015  . Essential hypertension   . Chest pain 10/24/2015  . History of non-ST elevation myocardial infarction (NSTEMI) 10/24/2015  . Diarrhea 10/24/2015  . Elevated troponin 10/24/2015  . Hypothyroidism 10/24/2015  . Stroke ([place])   . Hypertensive urgency   . GERD (gastroesophageal reflux disease)   . Gastroesophageal reflux disease without esophagitis   . CAD (coronary artery disease), native coronary artery 10/05/2015    Vishal Sandlin C. Sadarius Norman PT, DPT 09/18/17 2:56 PM   [facility] [address] [place], [place], [address] Phone: [phone]   Fax:  [phone]  Name: Mary Dalton MRN: [id number] Date of Birth: 1956-05-18

## 2017-09-20 ENCOUNTER — Encounter: Payer: Self-pay | Admitting: Physical Therapy

## 2017-09-20 ENCOUNTER — Ambulatory Visit: Payer: BLUE CROSS/BLUE SHIELD | Admitting: Physical Therapy

## 2017-09-20 DIAGNOSIS — M25561 Pain in right knee: Secondary | ICD-10-CM | POA: Diagnosis not present

## 2017-09-20 DIAGNOSIS — R2689 Other abnormalities of gait and mobility: Secondary | ICD-10-CM | POA: Diagnosis not present

## 2017-09-20 DIAGNOSIS — M25661 Stiffness of right knee, not elsewhere classified: Secondary | ICD-10-CM

## 2017-09-20 DIAGNOSIS — R600 Localized edema: Secondary | ICD-10-CM

## 2017-09-20 NOTE — Therapy (Signed)
[place], [place], [address] Phone: [phone]   Fax:  [phone]  Physical Therapy Treatment  Patient Details  Name: Mary Dalton MRN: [id number] Date of Birth: Dec 24, 1955 Referring Provider: Dr Meredith Pel   Encounter Date: 09/20/2017      PT End of Session - 09/20/17 1153    Visit Number 6   Number of Visits 16   Date for PT Re-Evaluation 10/26/17   Authorization Type BCBS   PT Start Time 1147   PT Stop Time 1230   PT Time Calculation (min) 43 min   Activity Tolerance Patient tolerated treatment well   Behavior During Therapy [facility] for tasks assessed/performed      Past Medical History:  Diagnosis Date  . Acute blood loss as cause of postoperative anemia 08/19/2017  . Arthritis    "knees" (08/14/2017)  . CAD (coronary artery disease), native coronary artery 10/2015   diffuse disease, but non obstructive  . Elevated troponin 10/24/2015  . Gastroesophageal reflux disease without esophagitis   . GERD (gastroesophageal reflux disease)   . Gout   . High cholesterol   . History of non-ST elevation myocardial infarction (NSTEMI) 10/24/2015  . History of total knee arthroplasty 03/27/2017   right  . Hypertension   . Hypothyroidism   . Metabolic syndrome   . Primary osteoarthritis of right knee 01/10/2017  . STEMI (ST elevation myocardial infarction) ([place]) "~ 10/2015"   Archie Endo 08/12/2017  . Stroke [facility])    "dx'd in 10/2015 when I went in for my cardiac cath" (03/20/2017)    Past Surgical History:  Procedure Laterality Date  . CARDIAC CATHETERIZATION N/A 10/26/2015   Procedure: Left Heart Cath and Coronary Angiography;  Surgeon: Jettie Booze, MD;  Location: [place] [facility];  Service: Cardiovascular;  Laterality: N/A;  . JOINT REPLACEMENT    . LAPAROSCOPIC CHOLECYSTECTOMY    . SHOULDER ARTHROSCOPY WITH OPEN ROTATOR CUFF REPAIR Bilateral   . TOTAL KNEE ARTHROPLASTY Left  03/20/2017   Procedure: TOTAL KNEE ARTHROPLASTY;  Surgeon: Meredith Pel, MD;  Location: [place];  Service: Orthopedics;  Laterality: Left;  . TOTAL KNEE ARTHROPLASTY Right 08/14/2017  . TOTAL KNEE ARTHROPLASTY Right 08/14/2017   Procedure: RIGHT TOTAL KNEE ARTHROPLASTY;  Surgeon: Meredith Pel, MD;  Location: [place];  Service: Orthopedics;  Laterality: Right;  . TUBAL LIGATION      There were no vitals filed for this visit.      Subjective Assessment - 09/20/17 1152    Subjective Patient reports her knee is stiff but she is having no pain. she is using the cane less and less. She is doing her exercises at home.    Limitations Standing;Walking   Diagnostic tests Nothing post op.    Patient Stated Goals Go on vacation    Currently in Pain? No/denies            [facility] PT Assessment - 09/20/17 0001      AROM   Right Knee Extension -5   Right Knee Flexion 111                     [facility] Adult PT Treatment/Exercise - 09/20/17 0001      Knee/Hip Exercises: Stretches   Active Hamstring Stretch Limitations 3x3s0sec with strap    Knee: Self-Stretch Limitations heel slides green strap 5x 10s holds   Gastroc Stretch 2 reps;30 seconds   Gastroc Stretch Limitations slant board  Knee/Hip Exercises: Aerobic   Nustep LE 5 min L5     Knee/Hip Exercises: Seated   Long Arc Quad 15 reps     Knee/Hip Exercises: Supine   Straight Leg Raises 15 reps     Cryotherapy   Number Minutes Cryotherapy --  declined     Manual Therapy   Soft tissue mobilization hamstring release in the posterior knee.    Passive ROM gentle passive flex and ext stretching                PT Education - 09/20/17 1153    Education provided Yes   Education Details reviewed HEP; symptom management    Person(s) Educated Patient   Methods Explanation;Demonstration;Tactile cues;Verbal cues   Comprehension Verbalized understanding;Returned demonstration;Verbal cues required;Tactile cues  required          PT Short Term Goals - 09/06/17 1135      PT SHORT TERM GOAL #1   Title Patient will demonstrate right knee PROM 0-120   Baseline 105 degrees Rt today   Status On-going     PT SHORT TERM GOAL #2   Title Patient will ambualte 300' with single point cane without increased pain    Baseline CGA   Status On-going     PT SHORT TERM GOAL #3   Title hip MMT grossly 4+/5 to indicate appropraite support to joint by surrounding soft tissue   Status On-going     PT SHORT TERM GOAL #4   Title Patient will be independent with inital HEP for strengthening and ROM    Status On-going           PT Long Term Goals - 08/30/17 1610      PT LONG TERM GOAL #1   Title Patient will go up/down 8 steps with reciprocol pattern in order to ambualte in the community    Time 8   Period Weeks   Status New   Target Date 10/25/17     PT LONG TERM GOAL #2   Title Patient will ambualte 3000' without device with no increase in pain in order to go shopping.    Time 8   Period Weeks   Status New   Target Date 10/25/17     PT LONG TERM GOAL #3   Title Patient will demonstrate a 44% limitation on FOTO    Time 8   Period Weeks   Status New               Plan - 09/20/17 1216    Clinical Impression Statement Patient continues to make good progress. She is still lacking some extension but overall she is improving. Thrapy added hamstring stretch.    Clinical Presentation Stable   Clinical Decision Making Low   PT Treatment/Interventions ADLs/Self Care Home Management;Cryotherapy;Electrical Stimulation;Iontophoresis 4mg /ml Dexamethasone;Functional mobility training;Stair training;Gait training;Ultrasound;Moist Heat;Therapeutic activities;Therapeutic exercise;Balance training;Neuromuscular re-education;Patient/family education;Passive range of motion;Scar mobilization;Manual techniques;Taping;Dry needling   PT Next Visit Plan PROM into flexion and extension, patellar mobilization,  balance, CKC   PT Home Exercise Plan quad set; SLR; knee flexion stretch with strap; knee extension stretch with towel, heel prop with something on knee   Consulted and Agree with Plan of Care Patient      Patient will benefit from skilled therapeutic intervention in order to improve the following deficits and impairments:  Abnormal gait, Decreased range of motion, Difficulty walking, Increased muscle spasms, Decreased activity tolerance, Pain, Improper body mechanics, Impaired flexibility, Decreased strength, Decreased mobility, Increased edema, Decreased  endurance  Visit Diagnosis: Acute pain of right knee  Stiffness of right knee, not elsewhere classified  Other abnormalities of gait and mobility  Localized edema     Problem List Patient Active Problem List   Diagnosis Date Noted  . Acute blood loss as cause of postoperative anemia 08/19/2017  . Presence of left artificial knee joint 05/02/2017  . S/P total knee arthroplasty 03/27/2017  . Arthritis of knee 03/20/2017  . Chronic pain of right knee 01/10/2017  . Primary osteoarthritis of right knee 01/10/2017  . Pain in right foot 01/10/2017  . Primary osteoarthritis of left knee 01/10/2017  . Hyperlipidemia 11/09/2015  . Essential hypertension   . Chest pain 10/24/2015  . History of non-ST elevation myocardial infarction (NSTEMI) 10/24/2015  . Diarrhea 10/24/2015  . Elevated troponin 10/24/2015  . Hypothyroidism 10/24/2015  . Stroke ([place])   . Hypertensive urgency   . GERD (gastroesophageal reflux disease)   . Gastroesophageal reflux disease without esophagitis   . CAD (coronary artery disease), native coronary artery 10/05/2015    Carney Living PT DPT  09/20/2017, 4:54 PM  [facility] [address] [place], [place], [address] Phone: [phone]   Fax:  [phone]  Name: SHELLIE ROGOFF MRN: [id number] Date of Birth: 12/31/1955

## 2017-09-24 ENCOUNTER — Encounter (INDEPENDENT_AMBULATORY_CARE_PROVIDER_SITE_OTHER): Payer: Self-pay | Admitting: Orthopedic Surgery

## 2017-09-24 ENCOUNTER — Ambulatory Visit (INDEPENDENT_AMBULATORY_CARE_PROVIDER_SITE_OTHER): Payer: BLUE CROSS/BLUE SHIELD | Admitting: Orthopedic Surgery

## 2017-09-24 DIAGNOSIS — Z96652 Presence of left artificial knee joint: Secondary | ICD-10-CM

## 2017-09-24 NOTE — Progress Notes (Signed)
Post-Op Visit Note   Patient: Mary Dalton           Date of Birth: 1956/03/01           MRN: [id number] Visit Date: 09/24/2017 PCP: Velna Hatchet, MD   Assessment & Plan:  Chief Complaint:  Chief Complaint  Patient presents with  . Right Knee - Follow-up   Visit Diagnoses:  1. Status post total left knee replacement     Plan: [name] is a patient who is 6 weeks out right knee replacement.  She is doing well.  On examination she has flexion easily to 100 degrees and full extension.  She is walking well with no assistive devices.  Only taking Tylenol for pain.  Plan is to start her in outpatient physical therapy and complete that course and when she is done she feels like she can do the exercises on her own.  I will see her back in 6 weeks for final check  Follow-Up Instructions: Return in about 6 weeks (around 11/05/2017).   Orders:  No orders of the defined types were placed in this encounter.  No orders of the defined types were placed in this encounter.   Imaging: No results found.  PMFS History: Patient Active Problem List   Diagnosis Date Noted  . Acute blood loss as cause of postoperative anemia 08/19/2017  . Presence of left artificial knee joint 05/02/2017  . S/P total knee arthroplasty 03/27/2017  . Arthritis of knee 03/20/2017  . Chronic pain of right knee 01/10/2017  . Primary osteoarthritis of right knee 01/10/2017  . Pain in right foot 01/10/2017  . Primary osteoarthritis of left knee 01/10/2017  . Hyperlipidemia 11/09/2015  . Essential hypertension   . Chest pain 10/24/2015  . History of non-ST elevation myocardial infarction (NSTEMI) 10/24/2015  . Diarrhea 10/24/2015  . Elevated troponin 10/24/2015  . Hypothyroidism 10/24/2015  . Stroke ([place])   . Hypertensive urgency   . GERD (gastroesophageal reflux disease)   . Gastroesophageal reflux disease without esophagitis   . CAD (coronary artery disease), native coronary artery 10/05/2015   Past  Medical History:  Diagnosis Date  . Acute blood loss as cause of postoperative anemia 08/19/2017  . Arthritis    "knees" (08/14/2017)  . CAD (coronary artery disease), native coronary artery 10/2015   diffuse disease, but non obstructive  . Elevated troponin 10/24/2015  . Gastroesophageal reflux disease without esophagitis   . GERD (gastroesophageal reflux disease)   . Gout   . High cholesterol   . History of non-ST elevation myocardial infarction (NSTEMI) 10/24/2015  . History of total knee arthroplasty 03/27/2017   right  . Hypertension   . Hypothyroidism   . Metabolic syndrome   . Primary osteoarthritis of right knee 01/10/2017  . STEMI (ST elevation myocardial infarction) ([place]) "~ 10/2015"   Archie Endo 08/12/2017  . Stroke [facility])    "dx'd in 10/2015 when I went in for my cardiac cath" (03/20/2017)    Family History  Problem Relation Age of Onset  . Healthy Mother   . Diabetes Mother   . Healthy Father   . Healthy Brother   . Healthy Brother   . Healthy Brother     Past Surgical History:  Procedure Laterality Date  . CARDIAC CATHETERIZATION N/A 10/26/2015   Procedure: Left Heart Cath and Coronary Angiography;  Surgeon: Jettie Booze, MD;  Location: [place] [facility];  Service: Cardiovascular;  Laterality: N/A;  . JOINT REPLACEMENT    .  LAPAROSCOPIC CHOLECYSTECTOMY    . SHOULDER ARTHROSCOPY WITH OPEN ROTATOR CUFF REPAIR Bilateral   . TOTAL KNEE ARTHROPLASTY Left 03/20/2017   Procedure: TOTAL KNEE ARTHROPLASTY;  Surgeon: Meredith Pel, MD;  Location: [place];  Service: Orthopedics;  Laterality: Left;  . TOTAL KNEE ARTHROPLASTY Right 08/14/2017  . TOTAL KNEE ARTHROPLASTY Right 08/14/2017   Procedure: RIGHT TOTAL KNEE ARTHROPLASTY;  Surgeon: Meredith Pel, MD;  Location: [place];  Service: Orthopedics;  Laterality: Right;  . 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 Smoking status: Never Smoker  .  Smokeless tobacco: Never Used  . Alcohol use No  . Drug use: No  . Sexual activity: No

## 2017-09-25 ENCOUNTER — Encounter: Payer: Self-pay | Admitting: Physical Therapy

## 2017-09-25 ENCOUNTER — Ambulatory Visit: Payer: BLUE CROSS/BLUE SHIELD | Admitting: Physical Therapy

## 2017-09-25 DIAGNOSIS — M25561 Pain in right knee: Secondary | ICD-10-CM

## 2017-09-25 DIAGNOSIS — M25661 Stiffness of right knee, not elsewhere classified: Secondary | ICD-10-CM

## 2017-09-25 DIAGNOSIS — R2689 Other abnormalities of gait and mobility: Secondary | ICD-10-CM | POA: Diagnosis not present

## 2017-09-25 DIAGNOSIS — R600 Localized edema: Secondary | ICD-10-CM | POA: Diagnosis not present

## 2017-09-25 NOTE — Therapy (Signed)
[place], [place], [address] Phone: [phone]   Fax:  [phone]  Physical Therapy Treatment  Patient Details  Name: Mary Dalton MRN: [id number] Date of Birth: July 17, 1956 Referring Provider: Dr Meredith Pel   Encounter Date: 09/25/2017      PT End of Session - 09/25/17 1413    Visit Number 7   Number of Visits 16   Date for PT Re-Evaluation 10/26/17   Authorization Type BCBS   PT Start Time 1415   PT Stop Time 1454   PT Time Calculation (min) 39 min   Activity Tolerance Patient tolerated treatment well   Behavior During Therapy [facility] for tasks assessed/performed      Past Medical History:  Diagnosis Date  . Acute blood loss as cause of postoperative anemia 08/19/2017  . Arthritis    "knees" (08/14/2017)  . CAD (coronary artery disease), native coronary artery 10/2015   diffuse disease, but non obstructive  . Elevated troponin 10/24/2015  . Gastroesophageal reflux disease without esophagitis   . GERD (gastroesophageal reflux disease)   . Gout   . High cholesterol   . History of non-ST elevation myocardial infarction (NSTEMI) 10/24/2015  . History of total knee arthroplasty 03/27/2017   right  . Hypertension   . Hypothyroidism   . Metabolic syndrome   . Primary osteoarthritis of right knee 01/10/2017  . STEMI (ST elevation myocardial infarction) ([place]) "~ 10/2015"   Archie Endo 08/12/2017  . Stroke [facility])    "dx'd in 10/2015 when I went in for my cardiac cath" (03/20/2017)    Past Surgical History:  Procedure Laterality Date  . CARDIAC CATHETERIZATION N/A 10/26/2015   Procedure: Left Heart Cath and Coronary Angiography;  Surgeon: Jettie Booze, MD;  Location: [place] [facility];  Service: Cardiovascular;  Laterality: N/A;  . JOINT REPLACEMENT    . LAPAROSCOPIC CHOLECYSTECTOMY    . SHOULDER ARTHROSCOPY WITH OPEN ROTATOR CUFF REPAIR Bilateral   . TOTAL KNEE ARTHROPLASTY Left  03/20/2017   Procedure: TOTAL KNEE ARTHROPLASTY;  Surgeon: Meredith Pel, MD;  Location: [place];  Service: Orthopedics;  Laterality: Left;  . TOTAL KNEE ARTHROPLASTY Right 08/14/2017  . TOTAL KNEE ARTHROPLASTY Right 08/14/2017   Procedure: RIGHT TOTAL KNEE ARTHROPLASTY;  Surgeon: Meredith Pel, MD;  Location: [place];  Service: Orthopedics;  Laterality: Right;  . TUBAL LIGATION      There were no vitals filed for this visit.      Subjective Assessment - 09/25/17 1413    Subjective Pt denies pain in her knee and is finally sleeping. No longer using AD.    Currently in Pain? No/denies                         [facility] Adult PT Treatment/Exercise - 09/25/17 0001      Knee/Hip Exercises: Stretches   Passive Hamstring Stretch Right;2 reps;30 seconds   Passive Hamstring Stretch Limitations seated EOB with green strap   Gastroc Stretch 2 reps;30 seconds   Gastroc Stretch Limitations slant board   Other Knee/Hip Stretches prone hang 2 min     Knee/Hip Exercises: Standing   Heel Raises Limitations heel/toe raises   Diplomatic Services operational officer Limitations A/P & lat static & dynamic   Other Standing Knee Exercises tandem balance     Knee/Hip Exercises: Supine   Short Arc Quad Sets Right;15 reps   Short Arc Quad Sets Limitations 2# 5s holds with slow lower  end of session   Bridges Limitations x20   Straight Leg Raises 20 reps     Knee/Hip Exercises: Sidelying   Hip ABduction 15 reps   Hip ABduction Limitations with leg extended + hip flexion                  PT Short Term Goals - 09/06/17 1135      PT SHORT TERM GOAL #1   Title Patient will demonstrate right knee PROM 0-120   Baseline 105 degrees Rt today   Status On-going     PT SHORT TERM GOAL #2   Title Patient will ambualte 300' with single point cane without increased pain    Baseline CGA   Status On-going     PT SHORT TERM GOAL #3   Title hip MMT grossly 4+/5 to indicate appropraite support to joint by  surrounding soft tissue   Status On-going     PT SHORT TERM GOAL #4   Title Patient will be independent with inital HEP for strengthening and ROM    Status On-going           PT Long Term Goals - 08/30/17 1610      PT LONG TERM GOAL #1   Title Patient will go up/down 8 steps with reciprocol pattern in order to ambualte in the community    Time 8   Period Weeks   Status New   Target Date 10/25/17     PT LONG TERM GOAL #2   Title Patient will ambualte 3000' without device with no increase in pain in order to go shopping.    Time 8   Period Weeks   Status New   Target Date 10/25/17     PT LONG TERM GOAL #3   Title Patient will demonstrate a 44% limitation on FOTO    Time 8   Period Weeks   Status New               Plan - 09/25/17 1450    Clinical Impression Statement Slight lack in extension,encouraged prone hang at home. good balance noted in tandem but unstable on wobble board indicating poor control on dynamic/unstable surfaces.    PT Treatment/Interventions ADLs/Self Care Home Management;Cryotherapy;Electrical Stimulation;Iontophoresis 4mg /ml Dexamethasone;Functional mobility training;Stair training;Gait training;Ultrasound;Moist Heat;Therapeutic activities;Therapeutic exercise;Balance training;Neuromuscular re-education;Patient/family education;Passive range of motion;Scar mobilization;Manual techniques;Taping;Dry needling   PT Next Visit Plan wall squats, prone hang with weight, SLS balance   PT Home Exercise Plan quad set; SLR; knee flexion stretch with strap; knee extension stretch with towel, heel prop with something on knee   Consulted and Agree with Plan of Care Patient      Patient will benefit from skilled therapeutic intervention in order to improve the following deficits and impairments:  Abnormal gait, Decreased range of motion, Difficulty walking, Increased muscle spasms, Decreased activity tolerance, Pain, Improper body mechanics, Impaired  flexibility, Decreased strength, Decreased mobility, Increased edema, Decreased endurance  Visit Diagnosis: Stiffness of right knee, not elsewhere classified  Acute pain of right knee  Other abnormalities of gait and mobility     Problem List Patient Active Problem List   Diagnosis Date Noted  . Acute blood loss as cause of postoperative anemia 08/19/2017  . Presence of left artificial knee joint 05/02/2017  . S/P total knee arthroplasty 03/27/2017  . Arthritis of knee 03/20/2017  . Chronic pain of right knee 01/10/2017  . Primary osteoarthritis of right knee 01/10/2017  . Pain in right foot 01/10/2017  .  Primary osteoarthritis of left knee 01/10/2017  . Hyperlipidemia 11/09/2015  . Essential hypertension   . Chest pain 10/24/2015  . History of non-ST elevation myocardial infarction (NSTEMI) 10/24/2015  . Diarrhea 10/24/2015  . Elevated troponin 10/24/2015  . Hypothyroidism 10/24/2015  . Stroke ([place])   . Hypertensive urgency   . GERD (gastroesophageal reflux disease)   . Gastroesophageal reflux disease without esophagitis   . CAD (coronary artery disease), native coronary artery 10/05/2015   Flem Enderle C. Lachelle Rissler PT, DPT 09/25/17 2:55 PM   [place] [facility] [address] [place], [place], [address] Phone: [phone]   Fax:  [phone]  Name: Mary Dalton MRN: [id number] Date of Birth: July 09, 1956

## 2017-09-27 ENCOUNTER — Ambulatory Visit: Payer: BLUE CROSS/BLUE SHIELD | Admitting: Physical Therapy

## 2017-09-27 DIAGNOSIS — M25561 Pain in right knee: Secondary | ICD-10-CM | POA: Diagnosis not present

## 2017-09-27 DIAGNOSIS — R600 Localized edema: Secondary | ICD-10-CM | POA: Diagnosis not present

## 2017-09-27 DIAGNOSIS — R2689 Other abnormalities of gait and mobility: Secondary | ICD-10-CM

## 2017-09-27 DIAGNOSIS — M25661 Stiffness of right knee, not elsewhere classified: Secondary | ICD-10-CM | POA: Diagnosis not present

## 2017-09-28 NOTE — Therapy (Addendum)
[place] [facility] [place], [place], [address] Phone: [phone]   Fax:  [phone]  Physical Therapy Treatment/ Discharge   Patient Details  Name: Mary [facility] MRN: [id number] Date of Birth: 04-19-1956 Referring Provider: Dr Meredith Pel   Encounter Date: 09/27/2017      PT End of Session - 09/27/17 1415    Visit Number 8   Number of Visits 16   Date for PT Re-Evaluation 10/26/17   Authorization Type BC[employer]   PT Start Time 7062   PT Stop Time 1457   PT Time Calculation (min) 44 min   Activity Tolerance Patient tolerated treatment well   Behavior During Therapy [facility] for tasks assessed/performed      Past Medical History:  Diagnosis Date  . Acute blood loss as cause of postoperative anemia 08/19/2017  . Arthritis    "knees" (08/14/2017)  . CAD (coronary artery disease), native coronary artery 10/2015   diffuse disease, but non obstructive  . Elevated troponin 10/24/2015  . Gastroesophageal reflux disease without esophagitis   . GERD (gastroesophageal reflux disease)   . Gout   . High cholesterol   . History of non-ST elevation myocardial infarction (NSTEMI) 10/24/2015  . History of total knee arthroplasty 03/27/2017   right  . Hypertension   . Hypothyroidism   . Metabolic syndrome   . Primary osteoarthritis of right knee 01/10/2017  . STEMI (ST elevation myocardial infarction) ([place]) "~ 10/2015"   Archie Endo 08/12/2017  . Stroke [facility])    "dx'd in 10/2015 when I went in for my cardiac cath" (03/20/2017)    Past Surgical History:  Procedure Laterality Date  . CARDIAC CATHETERIZATION N/A 10/26/2015   Procedure: Left Heart Cath and Coronary Angiography;  Surgeon: Jettie Booze, MD;  Location: [facility];  Service: Cardiovascular;  Laterality: N/A;  . JOINT REPLACEMENT    . LAPAROSCOPIC CHOLECYSTECTOMY    . SHOULDER ARTHROSCOPY WITH OPEN ROTATOR CUFF REPAIR Bilateral   . TOTAL KNEE ARTHROPLASTY  Left 03/20/2017   Procedure: TOTAL KNEE ARTHROPLASTY;  Surgeon: Meredith Pel, MD;  Location: [place];  Service: Orthopedics;  Laterality: Left;  . TOTAL KNEE ARTHROPLASTY Right 08/14/2017  . TOTAL KNEE ARTHROPLASTY Right 08/14/2017   Procedure: RIGHT TOTAL KNEE ARTHROPLASTY;  Surgeon: Meredith Pel, MD;  Location: [place];  Service: Orthopedics;  Laterality: Right;  . TUBAL LIGATION      There were no vitals filed for this visit.      Subjective Assessment - 09/27/17 1431    Subjective Patient reports no pain over the last few days. She has been to the MD who is happy with her porgress.    Limitations Standing;Walking   Diagnostic tests Nothing post op.    Patient Stated Goals Go on vacation                          [facility] Adult PT Treatment/Exercise - 09/28/17 0001      Knee/Hip Exercises: Stretches   Active Hamstring Stretch Limitations 3x3s0sec with strap    Gastroc Stretch 2 reps;30 seconds   Gastroc Stretch Limitations slant board     Knee/Hip Exercises: Standing   Heel Raises Limitations heel/toe raises   Other Standing Knee Exercises standing hip abduction and extension 2x10; standing march   2x10;      Knee/Hip Exercises: Supine   Short [employer] Right;15 reps   Bridges Limitations x20   Straight  Leg Raises 20 reps     Knee/Hip Exercises: Sidelying   Hip ABduction 15 reps   Hip ABduction Limitations with leg extended + hip flexion     Manual Therapy   Soft tissue mobilization hamstring release in the posterior knee.    Passive ROM gentle passive flex and ext stretching                PT Education - 09/28/17 0950    Education provided Yes   Education Details reviewed HEP; symptom mangement    Person(s) Educated Patient   Methods Explanation;Demonstration;Tactile cues;Verbal cues   Comprehension Verbalized understanding;Returned demonstration;Verbal cues required;Tactile cues required          PT Short Term Goals -  09/28/17 0957      PT SHORT TERM GOAL #1   Title Patient will demonstrate right knee PROM 0-120   Baseline 5-120   Time 4   Period Weeks   Status Partially Met     PT SHORT TERM GOAL #2   Title Patient will ambualte 300' with single point cane without increased pain    Baseline no device    Time 4   Period Weeks   Status Achieved     PT SHORT TERM GOAL #3   Title hip MMT grossly 4+/5 to indicate appropraite support to joint by surrounding soft tissue   Baseline gross 4+/5 right hip flexion    Time 4   Period Weeks   Status Achieved     PT SHORT TERM GOAL #4   Title Patient will be independent with inital HEP for strengthening and ROM    Baseline performing at home    Time 4   Period Weeks   Status Achieved           PT Long Term Goals - 09/28/17 9983      PT LONG TERM GOAL #1   Title Patient will go up/down 8 steps with reciprocol pattern in order to ambualte in the community    Baseline can go with minor pain    Time 8   Period Weeks   Status Achieved     PT LONG TERM GOAL #2   Title Patient will ambualte 3000' without device with no increase in pain in order to go shopping.    Baseline able to ambualte around the sotre    Time 8   Period Weeks   Status Achieved     PT LONG TERM GOAL #3   Title Patient will demonstrate a 44% limitation on FOTO    Baseline 40%    Time 8   Period Weeks   Status Achieved               Plan - 09/28/17 0951    Clinical Impression Statement Patient encouraged to continue with extension stretching at home. she feels comfortable with HEP. She has rehabed her other knee and had good results. She will be d/c'd  to HEP    History and Personal Factors relevant to plan of care: right knee OA    Clinical Presentation Stable   Clinical Decision Making Low   Rehab Potential Good   PT Frequency 2x / week   PT Duration 8 weeks   PT Treatment/Interventions ADLs/Self Care Home Management;Cryotherapy;Electrical  Stimulation;Iontophoresis 63m/ml Dexamethasone;Functional mobility training;Stair training;Gait training;Ultrasound;Moist Heat;Therapeutic activities;Therapeutic exercise;Balance training;Neuromuscular re-education;Patient/family education;Passive range of motion;Scar mobilization;Manual techniques;Taping;Dry needling   PT Next Visit Plan wall squats, prone hang with weight, SLS balance   PT  Home Exercise Plan quad set; SLR; knee flexion stretch with strap; knee extension stretch with towel, heel prop with something on knee   Consulted and Agree with Plan of Care Patient      Patient will benefit from skilled therapeutic intervention in order to improve the following deficits and impairments:  Abnormal gait, Decreased range of motion, Difficulty walking, Increased muscle spasms, Decreased activity tolerance, Pain, Improper body mechanics, Impaired flexibility, Decreased strength, Decreased mobility, Increased edema, Decreased endurance  Visit Diagnosis: Stiffness of right knee, not elsewhere classified  Acute pain of right knee  Other abnormalities of gait and mobility  Localized edema  PHYSICAL THERAPY DISCHARGE SUMMARY  Visits from Start of Care: 8  Current functional level related to goals / functional outcomes: Very little pain with functional activity    Remaining deficits: None    Education / Equipment: HEP Plan: Patient agrees to discharge.  Patient goals were met. Patient is being discharged due to being pleased with the current functional level.  ?????       Problem List Patient Active Problem List   Diagnosis Date Noted  . Acute blood loss as cause of postoperative anemia 08/19/2017  . Presence of left artificial knee joint 05/02/2017  . S/P total knee arthroplasty 03/27/2017  . Arthritis of knee 03/20/2017  . Chronic pain of right knee 01/10/2017  . Primary osteoarthritis of right knee 01/10/2017  . Pain in right foot 01/10/2017  . Primary osteoarthritis of  left knee 01/10/2017  . Hyperlipidemia 11/09/2015  . Essential hypertension   . Chest pain 10/24/2015  . History of non-ST elevation myocardial infarction (NSTEMI) 10/24/2015  . Diarrhea 10/24/2015  . Elevated troponin 10/24/2015  . Hypothyroidism 10/24/2015  . Stroke ([place])   . Hypertensive urgency   . GERD (gastroesophageal reflux disease)   . Gastroesophageal reflux disease without esophagitis   . CAD (coronary artery disease), native coronary artery 10/05/2015    Carney Living PT DPT  09/28/2017, 9:59 AM  [facility] [address] [place], [place], [address] Phone: [phone]   Fax:  [phone]  Name: Mary [facility] MRN: [id number] Date of Birth: 1956-03-19

## 2017-11-05 ENCOUNTER — Ambulatory Visit (INDEPENDENT_AMBULATORY_CARE_PROVIDER_SITE_OTHER): Payer: BLUE CROSS/BLUE SHIELD | Admitting: Orthopedic Surgery

## 2017-11-05 ENCOUNTER — Encounter (INDEPENDENT_AMBULATORY_CARE_PROVIDER_SITE_OTHER): Payer: Self-pay | Admitting: Orthopedic Surgery

## 2017-11-05 DIAGNOSIS — Z96651 Presence of right artificial knee joint: Secondary | ICD-10-CM

## 2017-11-10 ENCOUNTER — Encounter (INDEPENDENT_AMBULATORY_CARE_PROVIDER_SITE_OTHER): Payer: Self-pay | Admitting: Orthopedic Surgery

## 2017-11-10 NOTE — Progress Notes (Signed)
Post-Op Visit Note   Patient: Mary [facility]           Date of Birth: [birth year]           MRN: [id number] Visit Date: 11/05/2017 PCP: Velna Hatchet, MD   Assessment & Plan:  Chief Complaint:  Chief Complaint  Patient presents with  . Right Knee - Follow-up   Visit Diagnoses:  1. Status post right knee replacement     Plan: Mary [facility] is a patient who is now is out right total knee replacement.  She is doing well and has no problems.  States she is ambulating well.  On exam she has flexion to 110.  The knee is without effusion.  Impression is doing well following knee replacement.  Need for dental prophylaxis before procedures discussed.  Follow-up with me as needed.  Follow-Up Instructions: Return if symptoms worsen or fail to improve.   Orders:  No orders of the defined types were placed in this encounter.  No orders of the defined types were placed in this encounter.   Imaging: No results found.  PMFS History: Patient Active Problem List   Diagnosis Date Noted  . Acute blood loss as cause of postoperative anemia 08/19/2017  . Presence of left artificial knee joint 05/02/2017  . S/P total knee arthroplasty 03/27/2017  . Arthritis of knee 03/20/2017  . Chronic pain of right knee 01/10/2017  . Primary osteoarthritis of right knee 01/10/2017  . Pain in right foot 01/10/2017  . Primary osteoarthritis of left knee 01/10/2017  . Hyperlipidemia 11/09/2015  . Essential hypertension   . Chest pain 10/24/2015  . History of non-ST elevation myocardial infarction (NSTEMI) 10/24/2015  . Diarrhea 10/24/2015  . Elevated troponin 10/24/2015  . Hypothyroidism 10/24/2015  . Stroke ([place])   . Hypertensive urgency   . GERD (gastroesophageal reflux disease)   . Gastroesophageal reflux disease without esophagitis   . CAD (coronary artery disease), native coronary artery 10/05/2015   Past Medical History:  Diagnosis Date  . Acute blood loss as cause of postoperative anemia  08/19/2017  . Arthritis    "knees" (08/14/2017)  . CAD (coronary artery disease), native coronary artery 10/2015   diffuse disease, but non obstructive  . Elevated troponin 10/24/2015  . Gastroesophageal reflux disease without esophagitis   . GERD (gastroesophageal reflux disease)   . Gout   . High cholesterol   . History of non-ST elevation myocardial infarction (NSTEMI) 10/24/2015  . History of total knee arthroplasty 03/27/2017   right  . Hypertension   . Hypothyroidism   . Metabolic syndrome   . Primary osteoarthritis of right knee 01/10/2017  . STEMI (ST elevation myocardial infarction) ([place]) "~ 10/2015"   Archie Endo 08/12/2017  . Stroke [facility])    "dx'd in 10/2015 when I went in for my cardiac cath" (03/20/2017)    Family History  Problem Relation Age of Onset  . Healthy Mother   . Diabetes Mother   . Healthy Father   . Healthy Brother   . Healthy Brother   . Healthy Brother     Past Surgical History:  Procedure Laterality Date  . CARDIAC CATHETERIZATION N/A 10/26/2015   Procedure: Left Heart Cath and Coronary Angiography;  Surgeon: Jettie Booze, MD;  Location: [place] [facility];  Service: Cardiovascular;  Laterality: N/A;  . JOINT REPLACEMENT    . LAPAROSCOPIC CHOLECYSTECTOMY    . SHOULDER ARTHROSCOPY WITH OPEN ROTATOR CUFF REPAIR Bilateral   . TOTAL KNEE ARTHROPLASTY Left 03/20/2017  Procedure: TOTAL KNEE ARTHROPLASTY;  Surgeon: Meredith Pel, MD;  Location: [place];  Service: Orthopedics;  Laterality: Left;  . TOTAL KNEE ARTHROPLASTY Right 08/14/2017  . TOTAL KNEE ARTHROPLASTY Right 08/14/2017   Procedure: RIGHT TOTAL KNEE ARTHROPLASTY;  Surgeon: Meredith Pel, MD;  Location: [place];  Service: Orthopedics;  Laterality: Right;  . TUBAL LIGATION     Social History   Occupational History  . Occupation: Home Daycare  Tobacco Use  . Smoking status: Never Smoker  . Smokeless tobacco: Never Used  Substance and Sexual Activity  . Alcohol use: No     Alcohol/week: 0.0 oz  . Drug use: No  . Sexual activity: No

## 2017-11-14 ENCOUNTER — Ambulatory Visit (INDEPENDENT_AMBULATORY_CARE_PROVIDER_SITE_OTHER): Payer: BLUE CROSS/BLUE SHIELD | Admitting: Orthopedic Surgery

## 2017-11-20 ENCOUNTER — Ambulatory Visit (INDEPENDENT_AMBULATORY_CARE_PROVIDER_SITE_OTHER): Payer: Self-pay

## 2017-11-20 ENCOUNTER — Ambulatory Visit (INDEPENDENT_AMBULATORY_CARE_PROVIDER_SITE_OTHER): Payer: BLUE CROSS/BLUE SHIELD | Admitting: Orthopedic Surgery

## 2017-11-20 ENCOUNTER — Encounter (INDEPENDENT_AMBULATORY_CARE_PROVIDER_SITE_OTHER): Payer: Self-pay | Admitting: Orthopedic Surgery

## 2017-11-20 ENCOUNTER — Encounter (INDEPENDENT_AMBULATORY_CARE_PROVIDER_SITE_OTHER): Payer: Self-pay

## 2017-11-20 VITALS — Ht 66.0 in | Wt 243.0 lb

## 2017-11-20 DIAGNOSIS — M79671 Pain in right foot: Secondary | ICD-10-CM

## 2017-11-22 ENCOUNTER — Encounter (INDEPENDENT_AMBULATORY_CARE_PROVIDER_SITE_OTHER): Payer: Self-pay | Admitting: Orthopedic Surgery

## 2017-11-22 ENCOUNTER — Ambulatory Visit (INDEPENDENT_AMBULATORY_CARE_PROVIDER_SITE_OTHER): Payer: BLUE CROSS/BLUE SHIELD

## 2017-11-22 ENCOUNTER — Ambulatory Visit (INDEPENDENT_AMBULATORY_CARE_PROVIDER_SITE_OTHER): Payer: BLUE CROSS/BLUE SHIELD | Admitting: Orthopedic Surgery

## 2017-11-22 DIAGNOSIS — M2021 Hallux rigidus, right foot: Secondary | ICD-10-CM

## 2017-11-22 DIAGNOSIS — M6701 Short Achilles tendon (acquired), right ankle: Secondary | ICD-10-CM | POA: Diagnosis not present

## 2017-11-22 DIAGNOSIS — M79671 Pain in right foot: Secondary | ICD-10-CM | POA: Diagnosis not present

## 2017-11-22 NOTE — Progress Notes (Signed)
Patient left prior to being seen.  She will be called back for follow-up.

## 2017-11-22 NOTE — Progress Notes (Signed)
Office Visit Note   Patient: [facility]           Date of Birth: [birth year]           MRN: [id number] Visit Date: 11/22/2017              Requested by: Velna Hatchet, MD [address] [place], [place] [address] PCP: Velna Hatchet, MD  Chief Complaint  Patient presents with  . Right Foot - Pain      HPI: Patient is a 61 year old woman who presents complaining of midfoot pain of the right foot.  She states she has had this for a while she is status post knee replacement and feels like she is doing better other than her knee pain.  She states that she is ready to have her foot fixed.  Assessment & Plan: Visit Diagnoses:  1. Pain in right foot   2. Achilles tendon contracture, right   3. Hallux rigidus, right foot     Plan: Recommended heel cord stretching 5 times a day to help with the Achilles contracture.  Recommended a stiff soled walking or Trail running shoe to unload the midfoot.  Discussed that if she fails conservative treatment over several months we could consider a midfoot fusion.  Patient may also require a fusion through the MTP joint of the great toe.  Follow-Up Instructions: Return if symptoms worsen or fail to improve.   Ortho Exam  Patient is alert, oriented, no adenopathy, well-dressed, normal affect, normal respiratory effort. Examination patient has a good dorsalis pedis pulse she does have an antalgic gait.  With her knee extended she has dorsiflexion to neutral with heel cord tightness.  Palpation across the midfoot reproduces her pain she does have palpable bony spurs with radiographic evidence of midfoot degenerative changes.  She does have a hallux valgus with a hallux rigidus with only about 30 degrees of dorsiflexion of the great toe.  She states she has had pain beneath the second and third metatarsal heads in the past but is not painful to palpation at this time she does have flexible clawing of the second and third toes.  Imaging: Xr Foot 2  Views Right  Result Date: 11/22/2017 2 view radiographs of the right foot shows bunion deformity of the great toe with clawing of the second and third toe with a long second and third metatarsal with joint space collapse of the great toe MTP joint with osteophytic bone spurs through the midfoot with degenerative arthritic changes throughout the midfoot.  No images are attached to the encounter.  Labs: Lab Results  Component Value Date   HGBA1C 6.6 (H) 10/25/2015   REPTSTATUS 08/03/2017 FINAL 08/02/2017   CULT MULTIPLE SPECIES PRESENT, SUGGEST RECOLLECTION (A) 08/02/2017    @LABSALLVALUES (HGBA1)@  There is no height or weight on file to calculate BMI.  Orders:  Orders Placed This Encounter  Procedures  . XR Foot 2 Views Right   No orders of the defined types were placed in this encounter.    Procedures: No procedures performed  Clinical Data: No additional findings.  ROS:  All other systems negative, except as noted in the HPI. Review of Systems  Objective: Vital Signs: LMP  (LMP Unknown)   Specialty Comments:  No specialty comments available.  PMFS History: Patient Active Problem List   Diagnosis Date Noted  . Acute blood loss as cause of postoperative anemia 08/19/2017  . Presence of left artificial knee joint 05/02/2017  . S/P total knee arthroplasty  03/27/2017  . Arthritis of knee 03/20/2017  . Chronic pain of right knee 01/10/2017  . Primary osteoarthritis of right knee 01/10/2017  . Pain in right foot 01/10/2017  . Primary osteoarthritis of left knee 01/10/2017  . Hyperlipidemia 11/09/2015  . Essential hypertension   . Chest pain 10/24/2015  . History of non-ST elevation myocardial infarction (NSTEMI) 10/24/2015  . Diarrhea 10/24/2015  . Elevated troponin 10/24/2015  . Hypothyroidism 10/24/2015  . Stroke ([place])   . Hypertensive urgency   . GERD (gastroesophageal reflux disease)   . Gastroesophageal reflux disease without esophagitis   . CAD  (coronary artery disease), native coronary artery 10/05/2015   Past Medical History:  Diagnosis Date  . Acute blood loss as cause of postoperative anemia 08/19/2017  . Arthritis    "knees" (08/14/2017)  . CAD (coronary artery disease), native coronary artery 10/2015   diffuse disease, but non obstructive  . Elevated troponin 10/24/2015  . Gastroesophageal reflux disease without esophagitis   . GERD (gastroesophageal reflux disease)   . Gout   . High cholesterol   . History of non-ST elevation myocardial infarction (NSTEMI) 10/24/2015  . History of total knee arthroplasty 03/27/2017   right  . Hypertension   . Hypothyroidism   . Metabolic syndrome   . Primary osteoarthritis of right knee 01/10/2017  . STEMI (ST elevation myocardial infarction) ([place]) "~ 10/2015"   Archie Endo 08/12/2017  . Stroke [facility])    "dx'd in 10/2015 when I went in for my cardiac cath" (03/20/2017)    Family History  Problem Relation Age of Onset  . Healthy Mother   . Diabetes Mother   . Healthy Father   . Healthy Brother   . Healthy Brother   . Healthy Brother     Past Surgical History:  Procedure Laterality Date  . CARDIAC CATHETERIZATION N/A 10/26/2015   Procedure: Left Heart Cath and Coronary Angiography;  Surgeon: Jettie Booze, MD;  Location: [place] [facility];  Service: Cardiovascular;  Laterality: N/A;  . JOINT REPLACEMENT    . LAPAROSCOPIC CHOLECYSTECTOMY    . SHOULDER ARTHROSCOPY WITH OPEN ROTATOR CUFF REPAIR Bilateral   . TOTAL KNEE ARTHROPLASTY Left 03/20/2017   Procedure: TOTAL KNEE ARTHROPLASTY;  Surgeon: Meredith Pel, MD;  Location: [place];  Service: Orthopedics;  Laterality: Left;  . TOTAL KNEE ARTHROPLASTY Right 08/14/2017  . TOTAL KNEE ARTHROPLASTY Right 08/14/2017   Procedure: RIGHT TOTAL KNEE ARTHROPLASTY;  Surgeon: Meredith Pel, MD;  Location: [place];  Service: Orthopedics;  Laterality: Right;  . TUBAL LIGATION     Social History   Occupational History  . Occupation:  Home Daycare  Tobacco Use  . Smoking status: Never Smoker  . Smokeless tobacco: Never Used  Substance and Sexual Activity  . Alcohol use: No    Alcohol/week: 0.0 oz  . Drug use: No  . Sexual activity: No

## 2018-02-28 DIAGNOSIS — Z6839 Body mass index (BMI) 39.0-39.9, adult: Secondary | ICD-10-CM | POA: Diagnosis not present

## 2018-02-28 DIAGNOSIS — J029 Acute pharyngitis, unspecified: Secondary | ICD-10-CM | POA: Diagnosis not present

## 2018-02-28 DIAGNOSIS — J020 Streptococcal pharyngitis: Secondary | ICD-10-CM | POA: Diagnosis not present

## 2018-03-19 DIAGNOSIS — R05 Cough: Secondary | ICD-10-CM | POA: Diagnosis not present

## 2018-03-19 DIAGNOSIS — Z6839 Body mass index (BMI) 39.0-39.9, adult: Secondary | ICD-10-CM | POA: Diagnosis not present

## 2018-03-19 DIAGNOSIS — J029 Acute pharyngitis, unspecified: Secondary | ICD-10-CM | POA: Diagnosis not present

## 2018-03-19 DIAGNOSIS — I10 Essential (primary) hypertension: Secondary | ICD-10-CM | POA: Diagnosis not present

## 2018-05-03 ENCOUNTER — Ambulatory Visit
Admission: RE | Admit: 2018-05-03 | Discharge: 2018-05-03 | Disposition: A | Discharge status: Home / Self Care | Payer: BLUE CROSS/BLUE SHIELD | Source: Ambulatory Visit | Attending: Internal Medicine | Admitting: Internal Medicine

## 2018-05-03 ENCOUNTER — Other Ambulatory Visit: Payer: Self-pay | Admitting: Internal Medicine

## 2018-05-03 DIAGNOSIS — R221 Localized swelling, mass and lump, neck: Secondary | ICD-10-CM

## 2018-05-03 DIAGNOSIS — R05 Cough: Secondary | ICD-10-CM | POA: Diagnosis not present

## 2018-05-03 DIAGNOSIS — Z6838 Body mass index (BMI) 38.0-38.9, adult: Secondary | ICD-10-CM | POA: Diagnosis not present

## 2018-05-03 DIAGNOSIS — R51 Headache: Secondary | ICD-10-CM | POA: Diagnosis not present

## 2018-05-03 IMAGING — CT CT NECK W/ CM
2 of 3 series · 7 of 14 positions shown, 8 images · IV contrast (iopamidol)
Comparison: None.

CLINICAL DATA: Bilateral neck masses

EXAM:
CT NECK WITH CONTRAST
TECHNIQUE: Multidetector CT imaging of the neck was performed using the
standard protocol following the bolus administration of intravenous
contrast.
Creatinine was obtained on site at [HOSPITAL] at [HOSPITAL].
Results: Creatinine 0.7 mg/dL.
CONTRAST:  75mL [QS] IOPAMIDOL ([QS]) INJECTION 61%

[Series 3: neck · axial · 0.49mm/px · z∈[-285,-165]mm · 3 of 121 slices shown]
[im 31/121  bone]
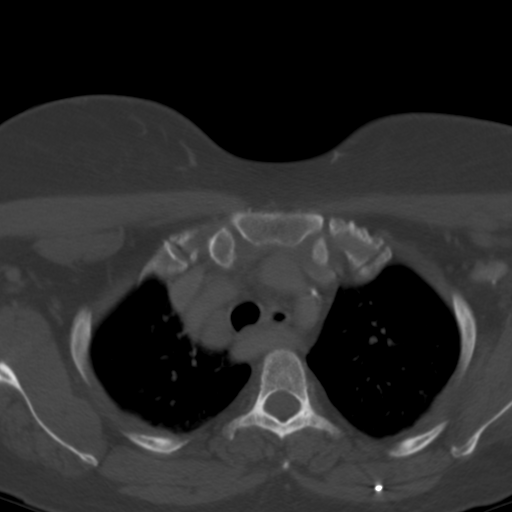
[im 61/121  bone]
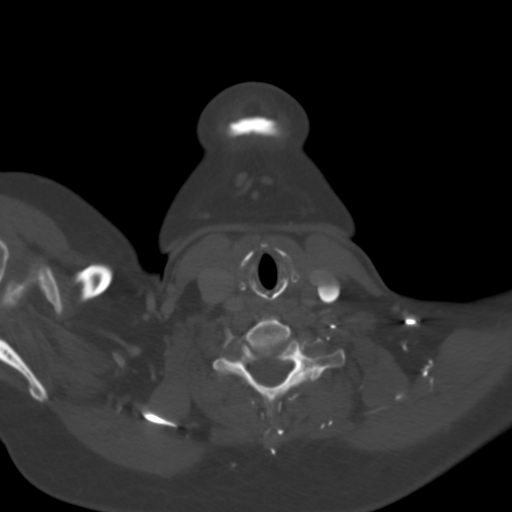
[im 91/121  bone]
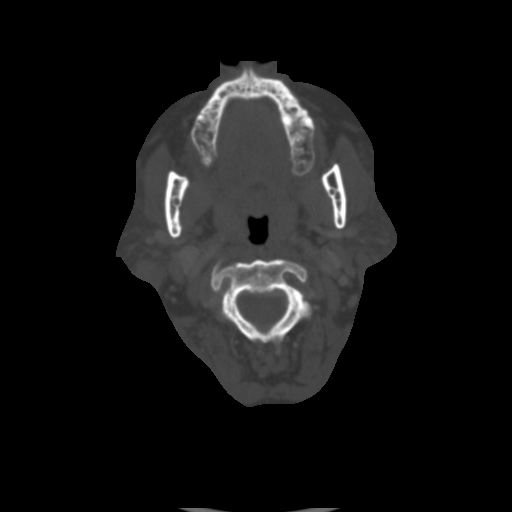

[Series 6: angled axial-oropharynx · axial · 0.46mm/px · z∈[-316,-173]mm · 4 of 122 slices shown, 5 images]
[im 25/122  soft-tissue]
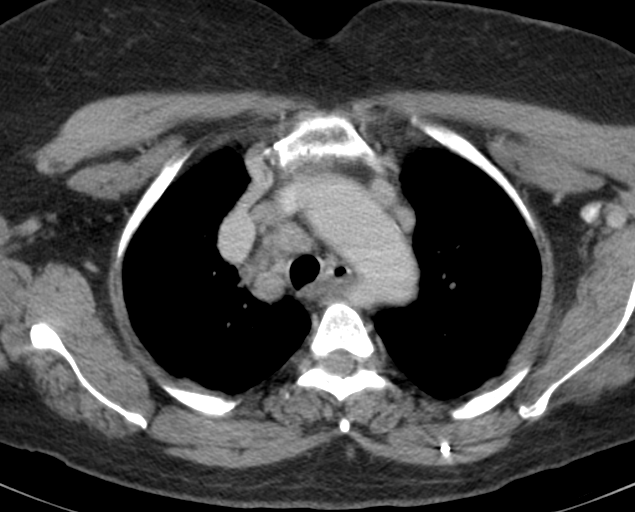
[im 25/122  bone]
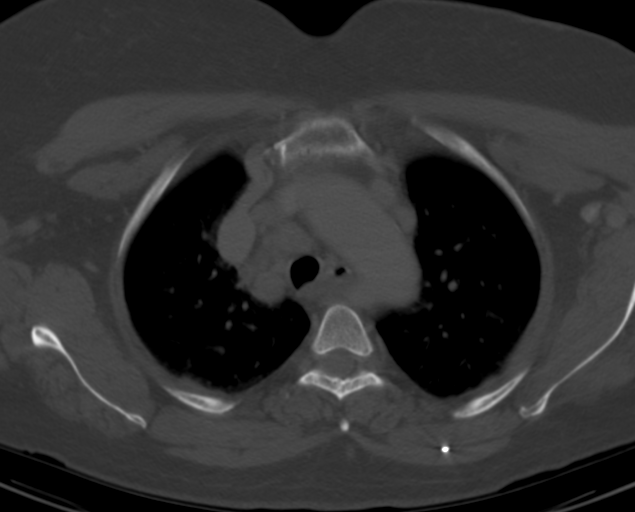
[im 49/122  bone]
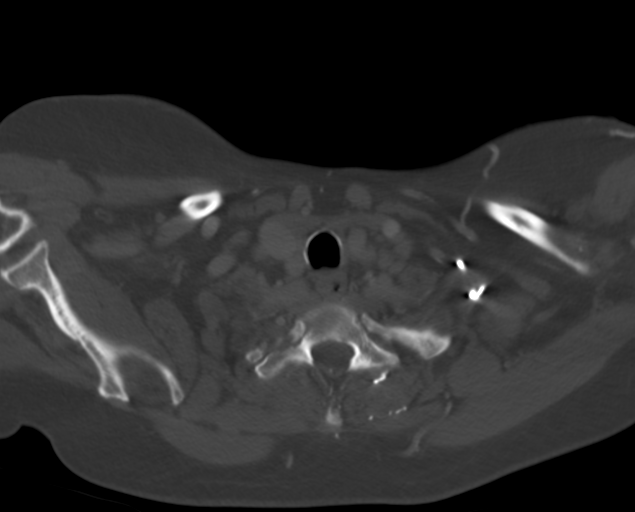
[im 73/122  bone]
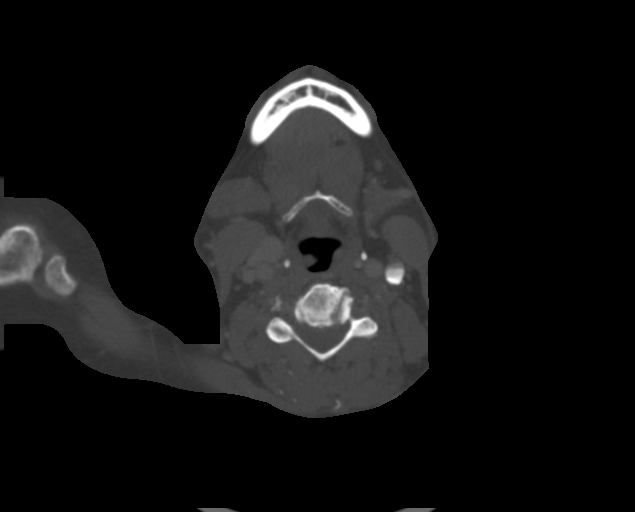
[im 97/122  bone]
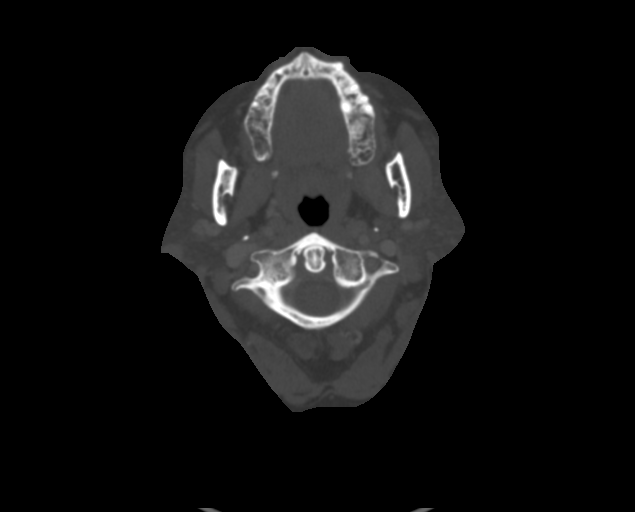

[7 of 14 positions shown; findings below may reference images not displayed]

FINDINGS: Pharynx and larynx: No evidence of mass or swelling.

Salivary glands: Nodules in the bilateral parotids, more extensive
on the right, measuring up to 13 mm. This is likely adenopathy in
this setting. No acute finding.

Thyroid: Atrophic or surgically absent.

Lymph nodes: There are enlarged lymph nodes in the bilateral parotid
station, upper left posterior triangle, and in the mediastinum. The
rounded left upper triangle node has an overlying palpable marker
and measures 1 cm in diameter. Mediastinal nodes are the largest,
measuring up to 18 mm in diameter at the right lower paratracheal
station. Subcarinal adenopathy measures 23 mm. There is no
historical indication of systemic infection symptoms. No chart
history of sarcoid or malignancy.

Vascular: Minor calcification at the carotid bifurcations.
Incidental aberrant right subclavian artery

Limited intracranial: Negative

Visualized orbits: Negative

Mastoids and visualized paranasal sinuses: Small polyp or retention
cyst in the right maxillary sinus.

Skeleton: C4-5 and C5-6 degenerative disc narrowing with endplate
ridging.

Upper chest: Adenopathy is noted above

Other: These results will be called to the ordering clinician or
representative by the Radiologist Assistant, and communication
documented in the PACS or zVision Dashboard.
IMPRESSION: Parotid, cervical, and mediastinal lymphadenopathy that is primarily
concerning for lymphoma; suggest tissue sampling. The most
accessible node is in the high left posterior triangle where there
is a palpable marker.

## 2018-05-03 MED ORDER — IOPAMIDOL (ISOVUE-300) INJECTION 61%
75.0000 mL | Freq: Once | INTRAVENOUS | Status: AC | PRN
  Administered 2018-05-03: 75 mL via INTRAVENOUS

## 2018-05-06 ENCOUNTER — Other Ambulatory Visit (HOSPITAL_COMMUNITY): Payer: Self-pay | Admitting: Internal Medicine

## 2018-05-06 DIAGNOSIS — R590 Localized enlarged lymph nodes: Secondary | ICD-10-CM

## 2018-05-17 ENCOUNTER — Other Ambulatory Visit: Payer: Self-pay | Admitting: Student

## 2018-05-20 ENCOUNTER — Ambulatory Visit (HOSPITAL_COMMUNITY)
Admission: RE | Admit: 2018-05-20 | Discharge: 2018-05-20 | Disposition: A | Discharge status: Home / Self Care | Payer: BLUE CROSS/BLUE SHIELD | Source: Ambulatory Visit | Attending: Internal Medicine | Admitting: Internal Medicine

## 2018-05-20 ENCOUNTER — Encounter (HOSPITAL_COMMUNITY): Payer: Self-pay

## 2018-05-20 DIAGNOSIS — E039 Hypothyroidism, unspecified: Secondary | ICD-10-CM | POA: Insufficient documentation

## 2018-05-20 DIAGNOSIS — I10 Essential (primary) hypertension: Secondary | ICD-10-CM | POA: Insufficient documentation

## 2018-05-20 DIAGNOSIS — Z79899 Other long term (current) drug therapy: Secondary | ICD-10-CM | POA: Diagnosis not present

## 2018-05-20 DIAGNOSIS — R590 Localized enlarged lymph nodes: Secondary | ICD-10-CM | POA: Diagnosis not present

## 2018-05-20 DIAGNOSIS — E7800 Pure hypercholesterolemia, unspecified: Secondary | ICD-10-CM | POA: Diagnosis not present

## 2018-05-20 DIAGNOSIS — M50321 Other cervical disc degeneration at C4-C5 level: Secondary | ICD-10-CM | POA: Insufficient documentation

## 2018-05-20 DIAGNOSIS — K219 Gastro-esophageal reflux disease without esophagitis: Secondary | ICD-10-CM | POA: Diagnosis not present

## 2018-05-20 DIAGNOSIS — Z8673 Personal history of transient ischemic attack (TIA), and cerebral infarction without residual deficits: Secondary | ICD-10-CM | POA: Diagnosis not present

## 2018-05-20 DIAGNOSIS — I252 Old myocardial infarction: Secondary | ICD-10-CM | POA: Diagnosis not present

## 2018-05-20 DIAGNOSIS — M1711 Unilateral primary osteoarthritis, right knee: Secondary | ICD-10-CM | POA: Diagnosis not present

## 2018-05-20 DIAGNOSIS — M109 Gout, unspecified: Secondary | ICD-10-CM | POA: Diagnosis not present

## 2018-05-20 DIAGNOSIS — I888 Other nonspecific lymphadenitis: Secondary | ICD-10-CM | POA: Diagnosis not present

## 2018-05-20 LAB — PROTIME-INR
INR: 1.04
PROTHROMBIN TIME: 13.5 s (ref 11.4–15.2)

## 2018-05-20 LAB — CBC
HCT: 39.5 % (ref 36.0–46.0)
Hemoglobin: 12.7 g/dL (ref 12.0–15.0)
MCH: 29.8 pg (ref 26.0–34.0)
MCHC: 32.2 g/dL (ref 30.0–36.0)
MCV: 92.7 fL (ref 78.0–100.0)
Platelets: 379 10*3/uL (ref 150–400)
RBC: 4.26 MIL/uL (ref 3.87–5.11)
RDW: 14.3 % (ref 11.5–15.5)
WBC: 6.4 10*3/uL (ref 4.0–10.5)

## 2018-05-20 LAB — APTT: APTT: 31 s (ref 24–36)

## 2018-05-20 MED ORDER — MIDAZOLAM HCL 2 MG/2ML IJ SOLN
INTRAMUSCULAR | Status: AC | PRN
  Administered 2018-05-20: 1 mg via INTRAVENOUS
  Administered 2018-05-20: 0.5 mg via INTRAVENOUS

## 2018-05-20 MED ORDER — FENTANYL CITRATE (PF) 100 MCG/2ML IJ SOLN
INTRAMUSCULAR | Status: AC | PRN
  Administered 2018-05-20: 50 ug via INTRAVENOUS

## 2018-05-20 MED ORDER — MIDAZOLAM HCL 2 MG/2ML IJ SOLN
INTRAMUSCULAR | Status: AC
  Filled 2018-05-20: qty 4

## 2018-05-20 MED ORDER — LIDOCAINE HCL (PF) 1 % IJ SOLN
INTRAMUSCULAR | Status: AC
  Filled 2018-05-20: qty 30

## 2018-05-20 MED ORDER — SODIUM CHLORIDE 0.9 % IV SOLN: INTRAVENOUS | Status: DC

## 2018-05-20 MED ORDER — FENTANYL CITRATE (PF) 100 MCG/2ML IJ SOLN
INTRAMUSCULAR | Status: AC
  Filled 2018-05-20: qty 4

## 2018-05-20 NOTE — Procedures (Signed)
L neck LN Bx 18 g times 8 EBL 0 COmp 0

## 2018-05-20 NOTE — H&P (Signed)
Chief Complaint: Patient was seen in consultation today for cervical lymphadenopathy biopsy at the request of Holwerda,Scott  Referring Physician(s): Holwerda,Scott  Supervising Physician: Marybelle Killings  Patient Status: [facility] - Out-pt  History of Present Illness: Mary Dalton is a 62 y.o. female   Pt noticed palpable left neck LN few months ag Then right LN just last week Have not enlarged Nonpainful Work up :  CT Neck 5/31  IMPRESSION: Parotid, cervical, and mediastinal lymphadenopathy that is primarily concerning for lymphoma; suggest tissue sampling. The most accessible node is in the high left posterior triangle where there is a palpable marker.  Now scheduled for LAN biopsy Lymphoma work up per MD  Past Medical History:  Diagnosis Date  . Acute blood loss as cause of postoperative anemia 08/19/2017  . Arthritis    "knees" (08/14/2017)  . CAD (coronary artery disease), native coronary artery 10/2015   diffuse disease, but non obstructive  . Elevated troponin 10/24/2015  . Gastroesophageal reflux disease without esophagitis   . GERD (gastroesophageal reflux disease)   . Gout   . High cholesterol   . History of non-ST elevation myocardial infarction (NSTEMI) 10/24/2015  . History of total knee arthroplasty 03/27/2017   right  . Hypertension   . Hypothyroidism   . Metabolic syndrome   . Primary osteoarthritis of right knee 01/10/2017  . STEMI (ST elevation myocardial infarction) ([place]) "~ 10/2015"   Archie Endo 08/12/2017  . Stroke [facility])    "dx'd in 10/2015 when I went in for my cardiac cath" (03/20/2017)    Past Surgical History:  Procedure Laterality Date  . CARDIAC CATHETERIZATION N/A 10/26/2015   Procedure: Left Heart Cath and Coronary Angiography;  Surgeon: Jettie Booze, MD;  Location: [place] CV [facility];  Service: Cardiovascular;  Laterality: N/A;  . JOINT REPLACEMENT    . LAPAROSCOPIC CHOLECYSTECTOMY    . SHOULDER ARTHROSCOPY WITH OPEN ROTATOR CUFF  REPAIR Bilateral   . TOTAL KNEE ARTHROPLASTY Left 03/20/2017   Procedure: TOTAL KNEE ARTHROPLASTY;  Surgeon: Meredith Pel, MD;  Location: [place];  Service: Orthopedics;  Laterality: Left;  . TOTAL KNEE ARTHROPLASTY Right 08/14/2017  . TOTAL KNEE ARTHROPLASTY Right 08/14/2017   Procedure: RIGHT TOTAL KNEE ARTHROPLASTY;  Surgeon: Meredith Pel, MD;  Location: [place];  Service: Orthopedics;  Laterality: Right;  . TUBAL LIGATION      Allergies: No known allergies  Medications: Prior to Admission medications   Medication Sig Start Date End Date Taking? Authorizing Provider  cetirizine (ZYRTEC) 10 MG tablet Take 10 mg by mouth daily as needed for allergies.   Yes [provider]  levothyroxine (SYNTHROID, LEVOTHROID) 112 MCG tablet Take 112 mcg by mouth daily before breakfast.   Yes [provider]  metoprolol tartrate (LOPRESSOR) 25 MG tablet Take 1 tablet (25 mg total) by mouth 2 (two) times daily. 10/26/15  Yes Thurnell Lose, MD  Multiple Vitamins-Minerals (MULTIVITAMIN WITH MINERALS) tablet Take 1 tablet by mouth every other day.   Yes [provider]     Family History  Problem Relation Age of Onset  . Healthy Mother   . Diabetes Mother   . Healthy Father   . Healthy Brother   . Healthy Brother   . Healthy Brother     Social History   Socioeconomic History  . Marital status: Widowed    Spouse name: Not on file  . Number of children: Not on file  . Years of education: Not on file  .  Highest education level: Not on file  Occupational History  . Occupation: Home Daycare  Social Needs  . Financial resource strain: Not on file  . Food insecurity:    Worry: Not on file    Inability: Not on file  . Transportation needs:    Medical: Not on file    Non-medical: Not on file  Tobacco Use  . Smoking status: Never Smoker  . Smokeless tobacco: Never Used  Substance and Sexual Activity  . Alcohol use: No    Alcohol/week: 0.0 oz  . Drug  use: No  . Sexual activity: Never  Lifestyle  . Physical activity:    Days per week: Not on file    Minutes per session: Not on file  . Stress: Not on file  Relationships  . Social connections:    Talks on phone: Not on file    Gets together: Not on file    Attends religious service: Not on file    Active member of club or organization: Not on file    Attends meetings of clubs or organizations: Not on file    Relationship status: Not on file  Other Topics Concern  . Not on file  Social History Narrative   Admitted to [facility] 08/16/18   Widowed   Never smoked   Alcohol none   Full Code     Review of Systems: A 12 point ROS discussed and pertinent positives are indicated in the HPI above.  All other systems are negative.  Review of Systems  Constitutional: Negative for activity change, fatigue and fever.  HENT: Negative for sore throat and tinnitus.   Respiratory: Negative for shortness of breath.   Cardiovascular: Negative for chest pain.  Gastrointestinal: Negative for abdominal pain.  Psychiatric/Behavioral: Negative for behavioral problems and confusion.    Vital Signs: BP (!) 174/101   Pulse 74   Temp 98.6 F (37 C)   Resp 18   Ht 5\' 6"  (1.676 m)   Wt 220 lb (99.8 kg)   LMP  (LMP Unknown)   SpO2 99%   BMI 35.51 kg/m   Physical Exam  Constitutional: She is oriented to person, place, and time.  Cardiovascular: Normal rate and regular rhythm.  Pulmonary/Chest: Effort normal and breath sounds normal.  Abdominal: Soft. Bowel sounds are normal.  Musculoskeletal: Normal range of motion.  Neurological: She is alert and oriented to person, place, and time.  Skin: Skin is warm and dry.  Psychiatric: She has a normal mood and affect. Her behavior is normal. Judgment and thought content normal.  Nursing note and vitals reviewed.   Imaging: Ct Soft Tissue Neck W Contrast  Result Date: 05/03/2018 CLINICAL DATA:  Bilateral neck masses EXAM: CT  NECK WITH CONTRAST TECHNIQUE: Multidetector CT imaging of the neck was performed using the standard protocol following the bolus administration of intravenous contrast. Creatinine was obtained on site at [facility] at [address] Results: Creatinine 0.7 mg/dL. CONTRAST:  3mL ISOVUE-300 IOPAMIDOL (ISOVUE-300) INJECTION 61% COMPARISON:  None. FINDINGS: Pharynx and larynx: No evidence of mass or swelling. Salivary glands: Nodules in the bilateral parotids, more extensive on the right, measuring up to 13 mm. This is likely adenopathy in this setting. No acute finding. Thyroid: Atrophic or surgically absent. Lymph nodes: There are enlarged lymph nodes in the bilateral parotid station, upper left posterior triangle, and in the mediastinum. The rounded left upper triangle node has an overlying palpable marker and measures 1 cm in  diameter. Mediastinal nodes are the largest, measuring up to 18 mm in diameter at the right lower paratracheal station. Subcarinal adenopathy measures 23 mm. There is no historical indication of systemic infection symptoms. No chart history of sarcoid or malignancy. Vascular: Minor calcification at the carotid bifurcations. Incidental aberrant right subclavian artery Limited intracranial: Negative Visualized orbits: Negative Mastoids and visualized paranasal sinuses: Small polyp or retention cyst in the right maxillary sinus. Skeleton: C4-5 and C5-6 degenerative disc narrowing with endplate ridging. Upper chest: Adenopathy is noted above Other: These results will be called to the ordering clinician or representative by the Radiologist Assistant, and communication documented in the PACS or zVision Dashboard. IMPRESSION: Parotid, cervical, and mediastinal lymphadenopathy that is primarily concerning for lymphoma; suggest tissue sampling. The most accessible node is in the high left posterior triangle where there is a palpable marker. Electronically Signed   By: Monte Fantasia M.D.    On: 05/03/2018 15:20    Labs:  CBC: Recent Labs    08/02/17 1126  WBC 7.3  HGB 13.2  HCT 40.1  PLT 357    COAGS: No results for input(s): INR, APTT in the last 8760 hours.  BMP: Recent Labs    08/02/17 1126  NA 139  K 3.8  CL 106  CO2 25  GLUCOSE 98  BUN 13  CALCIUM 9.1  CREATININE 0.72  GFRNONAA >60  GFRAA >60    LIVER FUNCTION TESTS: No results for input(s): BILITOT, AST, ALT, ALKPHOS, PROT, ALBUMIN in the last 8760 hours.  TUMOR MARKERS: No results for input(s): AFPTM, CEA, CA199, CHROMGRNA in the last 8760 hours.  Assessment and Plan:  Bilat cervical LAN Mediastinal and parotid LAN For lymphoma work up per MD Now scheduled for cervical LAN biopsy Risks and benefits discussed with the patient including, but not limited to bleeding, infection, damage to adjacent structures or low yield requiring additional tests.  All of the patient's questions were answered, patient is agreeable to proceed. Consent signed and in chart.   Thank you for this interesting consult.  I greatly enjoyed meeting Mary Dalton and look forward to participating in their care.  A copy of this report was sent to the requesting provider on this date.  Electronically Signed: Lavonia Drafts, PA-C 05/20/2018, 12:05 PM   I spent a total of  30 Minutes   in face to face in clinical consultation, greater than 50% of which was counseling/coordinating care for cervical lymph node bx

## 2018-05-20 NOTE — Discharge Instructions (Addendum)

## 2018-05-31 ENCOUNTER — Other Ambulatory Visit: Payer: Self-pay | Admitting: Internal Medicine

## 2018-05-31 DIAGNOSIS — Z1231 Encounter for screening mammogram for malignant neoplasm of breast: Secondary | ICD-10-CM

## 2018-06-05 DIAGNOSIS — E038 Other specified hypothyroidism: Secondary | ICD-10-CM | POA: Diagnosis not present

## 2018-06-05 DIAGNOSIS — Z0000 Encounter for general adult medical examination without abnormal findings: Secondary | ICD-10-CM | POA: Diagnosis not present

## 2018-06-05 DIAGNOSIS — R82998 Other abnormal findings in urine: Secondary | ICD-10-CM | POA: Diagnosis not present

## 2018-06-05 DIAGNOSIS — M109 Gout, unspecified: Secondary | ICD-10-CM | POA: Diagnosis not present

## 2018-06-05 DIAGNOSIS — E119 Type 2 diabetes mellitus without complications: Secondary | ICD-10-CM | POA: Diagnosis not present

## 2018-06-11 DIAGNOSIS — R221 Localized swelling, mass and lump, neck: Secondary | ICD-10-CM | POA: Diagnosis not present

## 2018-06-11 DIAGNOSIS — I10 Essential (primary) hypertension: Secondary | ICD-10-CM | POA: Diagnosis not present

## 2018-06-11 DIAGNOSIS — Z1389 Encounter for screening for other disorder: Secondary | ICD-10-CM | POA: Diagnosis not present

## 2018-06-11 DIAGNOSIS — Z0000 Encounter for general adult medical examination without abnormal findings: Secondary | ICD-10-CM | POA: Diagnosis not present

## 2018-06-11 DIAGNOSIS — E7849 Other hyperlipidemia: Secondary | ICD-10-CM | POA: Diagnosis not present

## 2018-06-11 DIAGNOSIS — Z1212 Encounter for screening for malignant neoplasm of rectum: Secondary | ICD-10-CM | POA: Diagnosis not present

## 2018-06-18 DIAGNOSIS — N951 Menopausal and female climacteric states: Secondary | ICD-10-CM | POA: Diagnosis not present

## 2018-06-18 DIAGNOSIS — Z01419 Encounter for gynecological examination (general) (routine) without abnormal findings: Secondary | ICD-10-CM | POA: Diagnosis not present

## 2018-06-18 DIAGNOSIS — Z1151 Encounter for screening for human papillomavirus (HPV): Secondary | ICD-10-CM | POA: Diagnosis not present

## 2018-06-18 DIAGNOSIS — Z6836 Body mass index (BMI) 36.0-36.9, adult: Secondary | ICD-10-CM | POA: Diagnosis not present

## 2018-06-20 ENCOUNTER — Ambulatory Visit: Payer: BLUE CROSS/BLUE SHIELD

## 2018-06-26 ENCOUNTER — Ambulatory Visit
Admission: RE | Admit: 2018-06-26 | Discharge: 2018-06-26 | Disposition: A | Discharge status: Home / Self Care | Payer: BLUE CROSS/BLUE SHIELD | Source: Ambulatory Visit | Attending: Internal Medicine | Admitting: Internal Medicine

## 2018-06-26 DIAGNOSIS — Z1231 Encounter for screening mammogram for malignant neoplasm of breast: Secondary | ICD-10-CM

## 2018-06-26 IMAGING — MG DIGITAL SCREENING BILATERAL MAMMOGRAM WITH TOMO AND CAD
8 series · 8 of 24 positions shown · non-contrast
Comparison: Previous exam(s).

CLINICAL DATA: Screening.

EXAM:
DIGITAL SCREENING BILATERAL MAMMOGRAM WITH TOMO AND CAD

[L CC synth-2D]
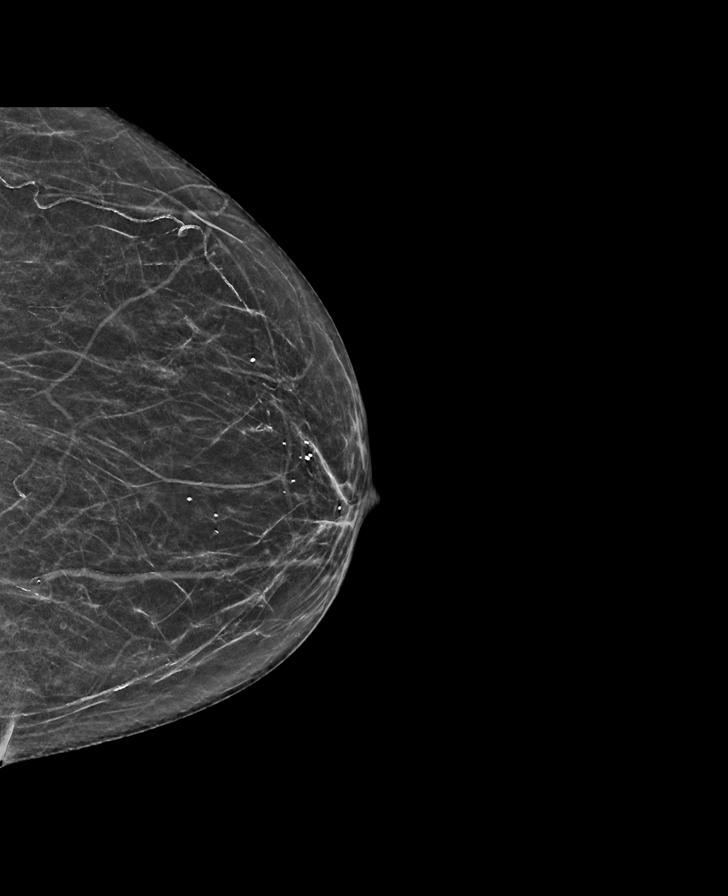

[L MLO synth-2D]
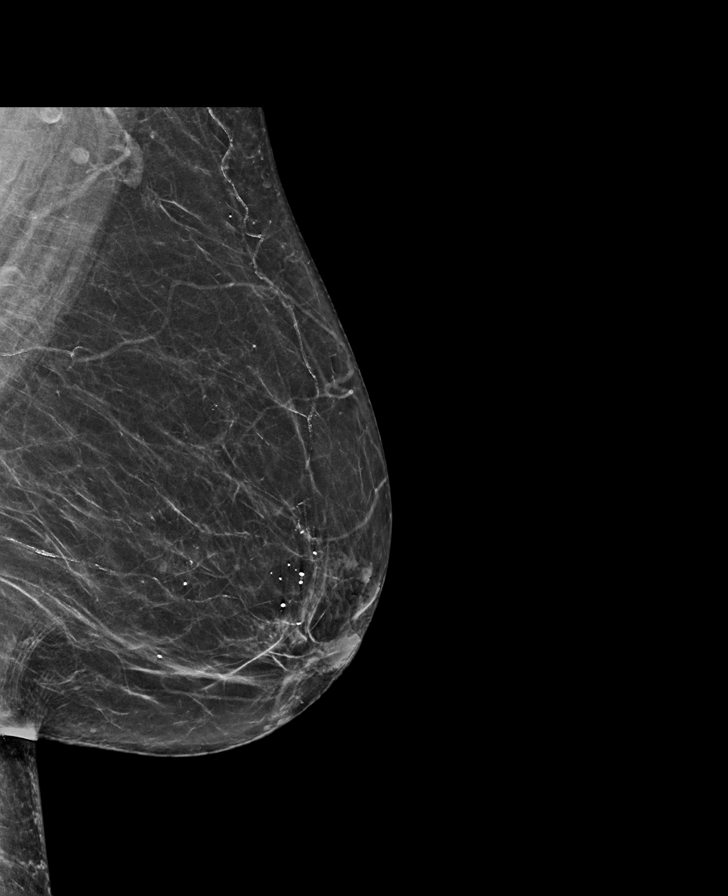

[R CC synth-2D]
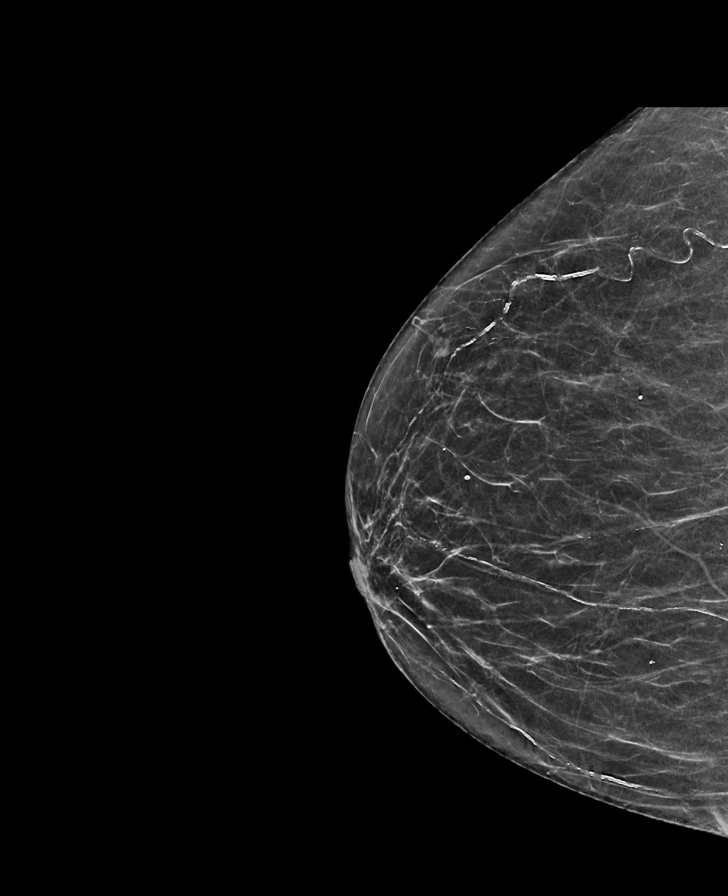

[R MLO synth-2D]
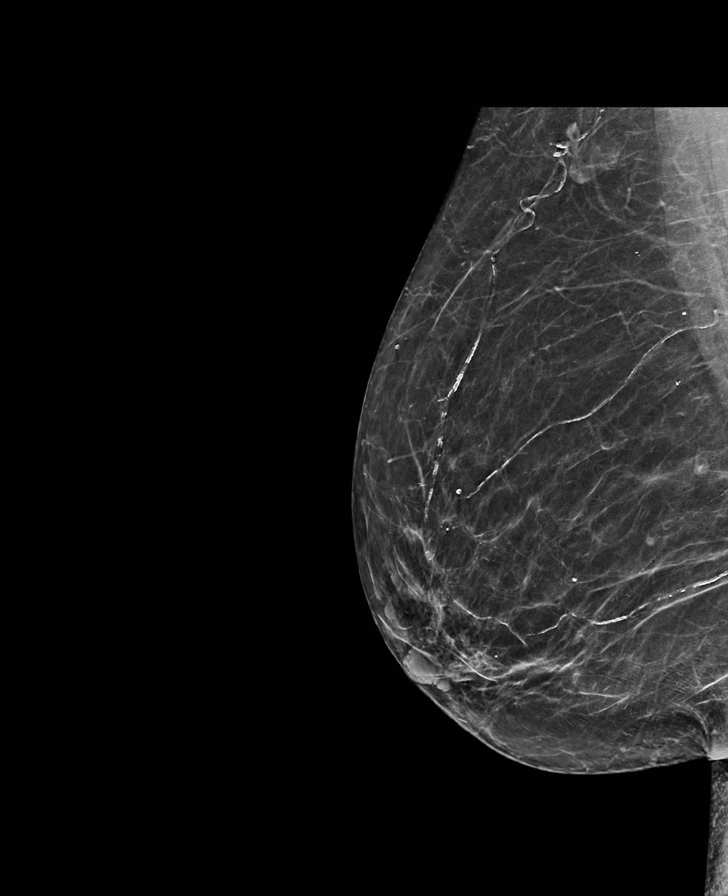

[R MLO tomo · tomo slice 33/65.0]
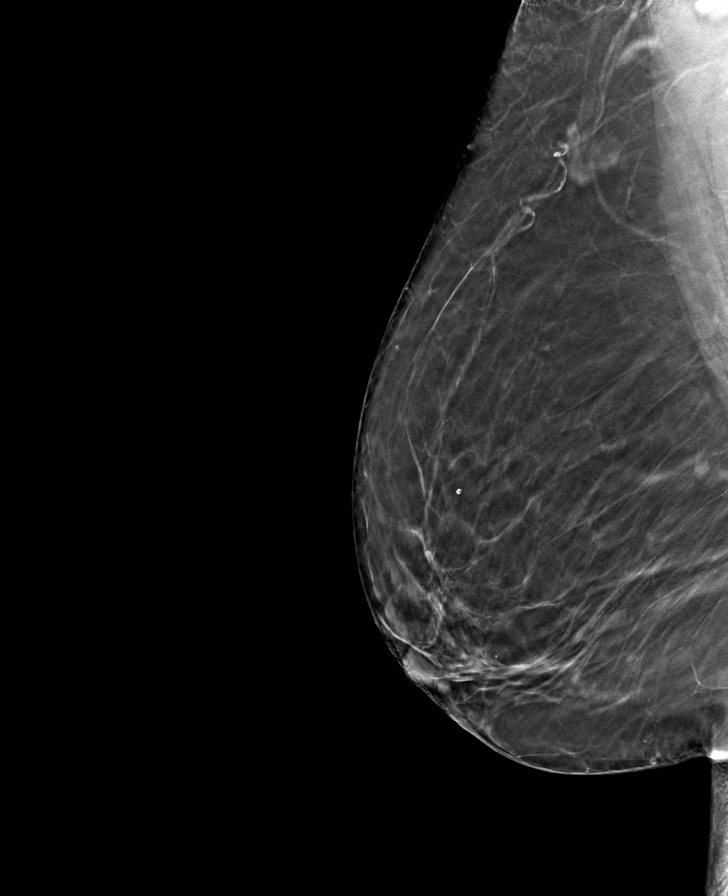

[L CC tomo · tomo slice 26/51.0]
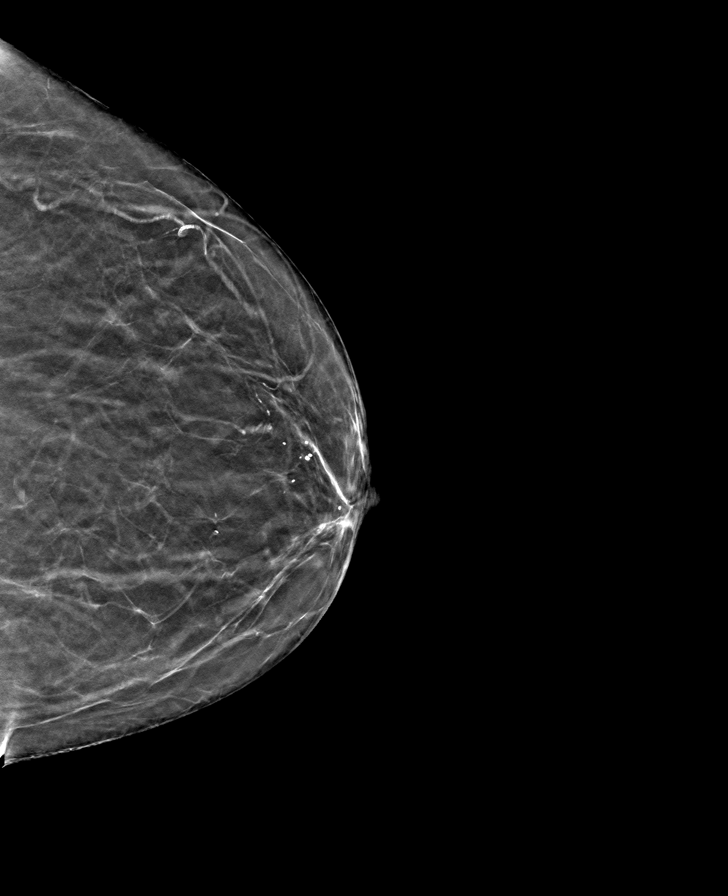

[R CC tomo · tomo slice 28/55.0]
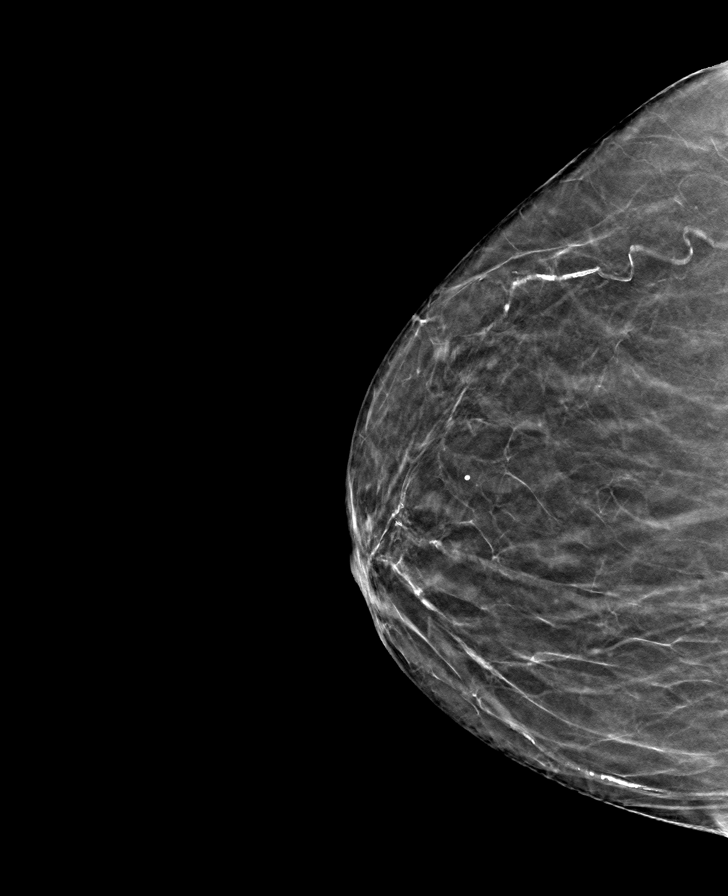

[L MLO tomo · tomo slice 34/67.0]
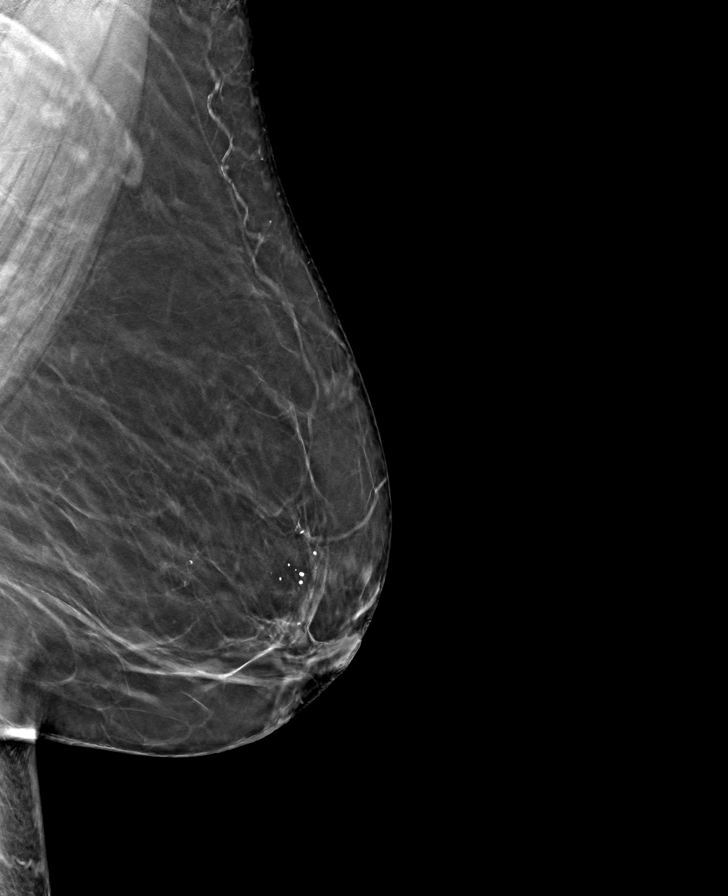

[8 of 24 positions shown; findings below may reference images not displayed]

ACR Breast Density Category b: There are scattered areas of
fibroglandular density.
FINDINGS: There are no findings suspicious for malignancy. Images were
processed with CAD.
IMPRESSION: No mammographic evidence of malignancy. A result letter of this
screening mammogram will be mailed directly to the patient.

RECOMMENDATION:
Screening mammogram in one year. (Code:[TQ])

BI-RADS CATEGORY  1: Negative.

## 2018-11-29 IMAGING — US US BIOPSY LYMPH NODE
1 series · 8 of 8 positions shown · non-contrast
Comparison: none

INDICATION: Neck lymphadenopathy

[Series 1: us biopsy lymph node · 0.05mm/px · 8 of 8 slices shown]
[im 1/8]
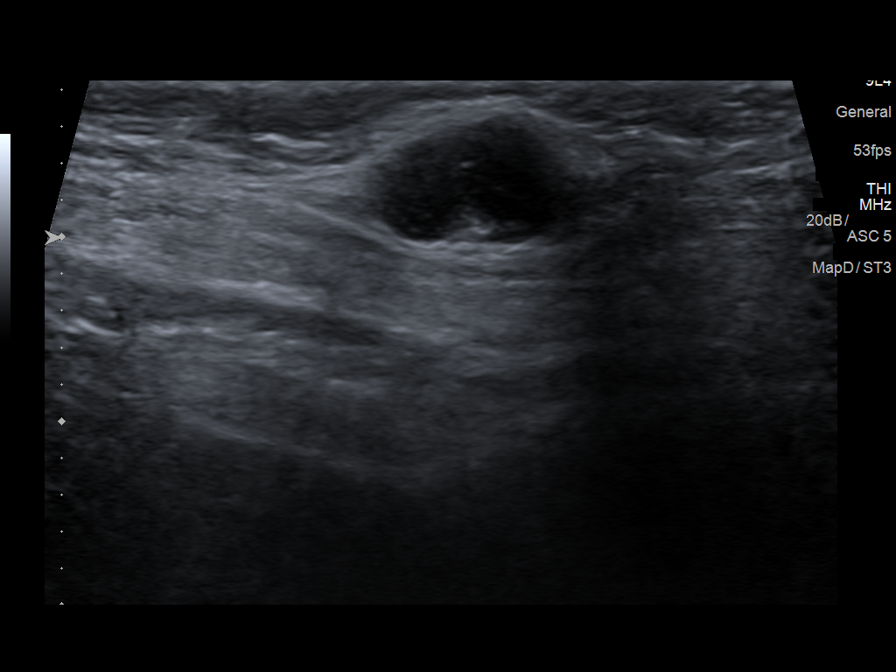
[im 2/8]
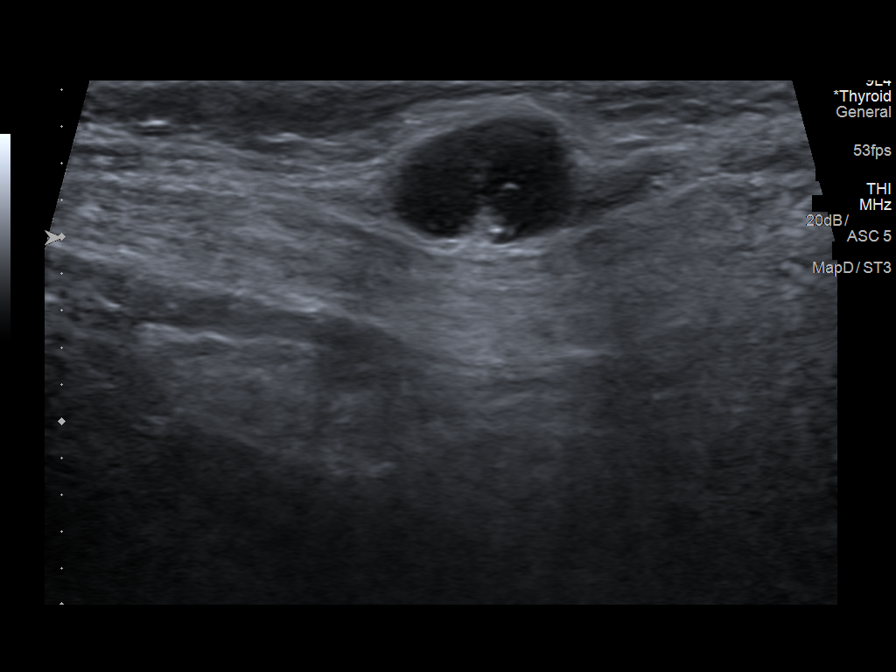
[im 3/8]
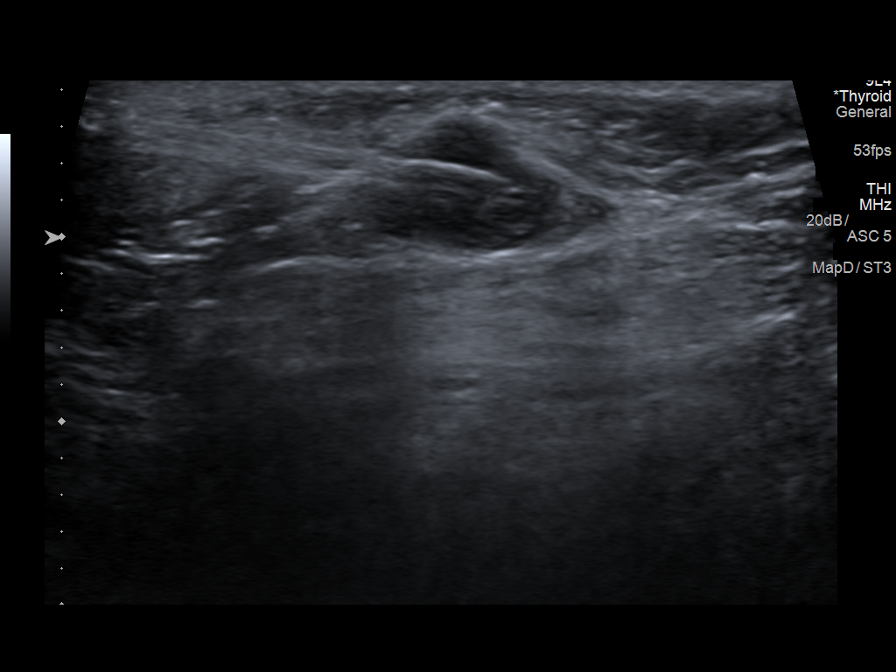
[im 4/8]
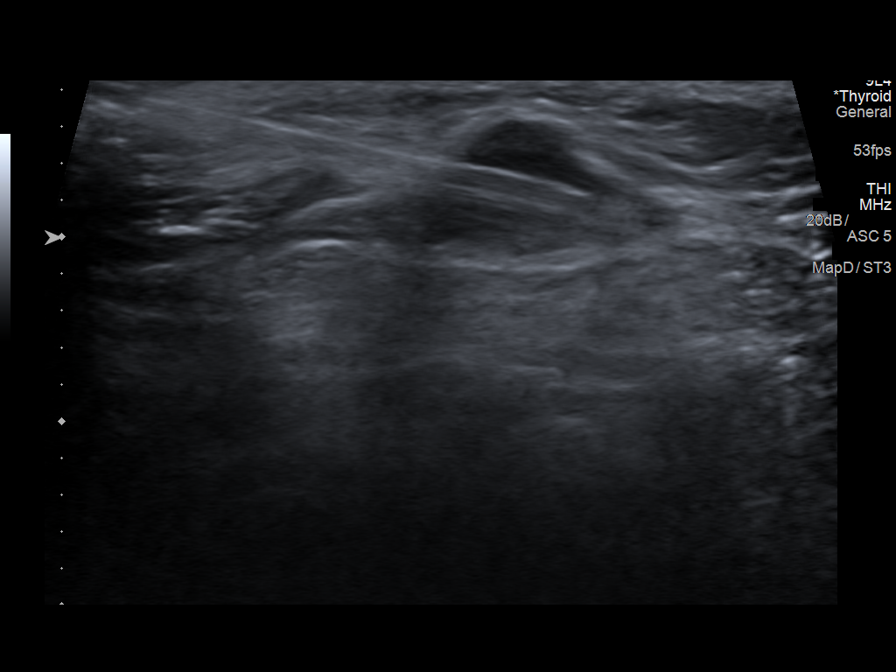
[im 5/8]
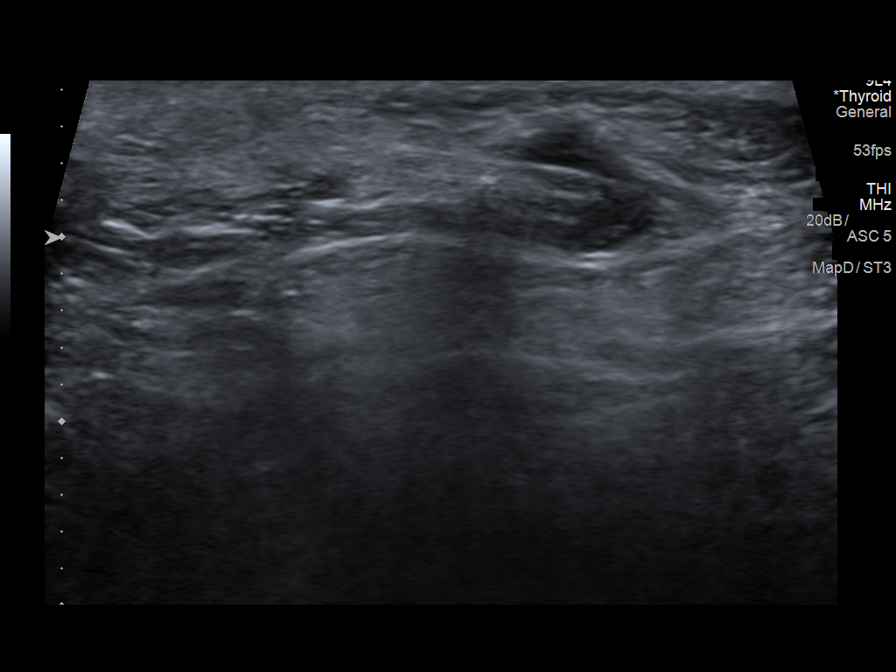
[im 6/8]
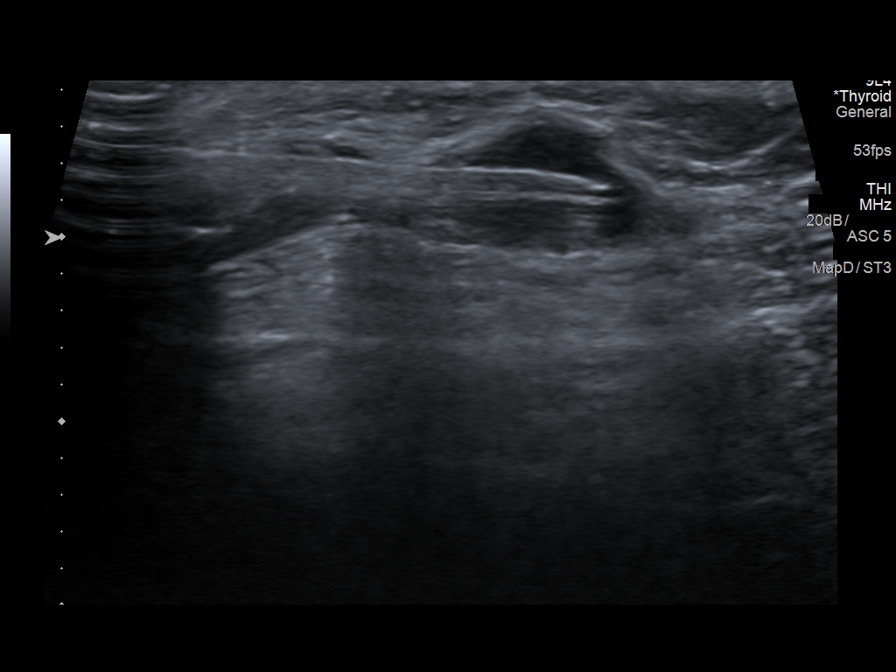
[im 7/8]
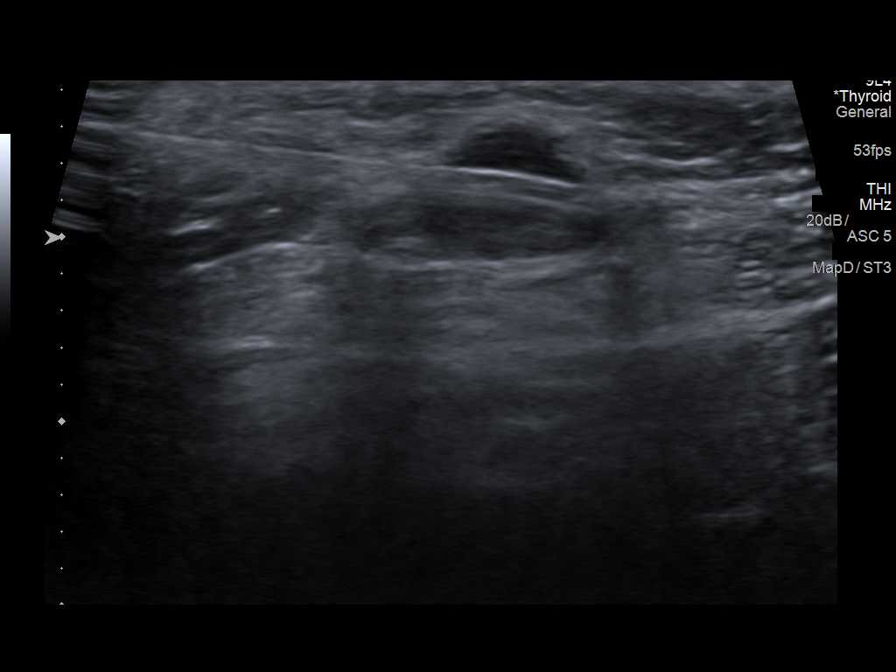
[im 8/8]
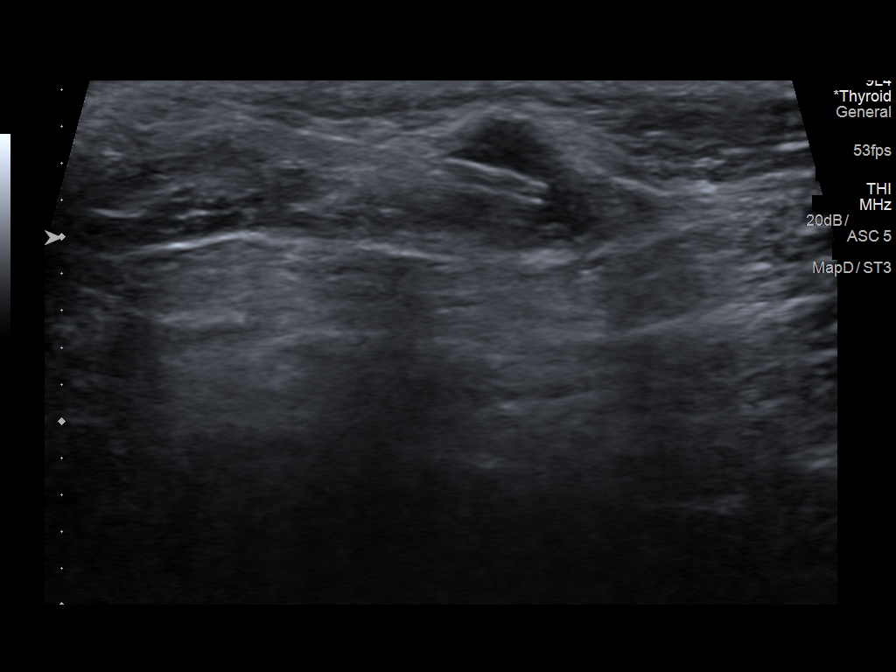

[8 of 8 positions shown; findings below may reference images not displayed]

EXAM:
ULTRASOUND GUIDED CORE BIOPSY OF LEFT POSTERIOR TRIANGLE NECK LYMPH
NODE

MEDICATIONS:
None.

ANESTHESIA/SEDATION:
Fentanyl 50 mcg IV; Versed 1.5 mg IV

Moderate Sedation Time:  10 minutes

The patient was continuously monitored during the procedure by the
interventional radiology nurse under my direct supervision.

PROCEDURE:
The procedure, risks, benefits, and alternatives were explained to
the patient. Questions regarding the procedure were encouraged and
answered. The patient understands and consents to the procedure.

The left neck was prepped with ChloraPrep in a sterile fashion, and
a sterile drape was applied covering the operative field. A sterile
gown and sterile gloves were used for the procedure. Local
anesthesia was provided with 1% Lidocaine.

Under sonographic guidance, 8 18 gauge core biopsies of the left
posterior triangle lymph node were obtained.

COMPLICATIONS:
None immediate.
FINDINGS: Images document needle placement in the left lack lymph node. Post
biopsy images demonstrate no evidence of hemorrhage.
IMPRESSION: Successful ultrasound-guided core biopsy of a left neck lymph node.

## 2018-12-11 DIAGNOSIS — E7849 Other hyperlipidemia: Secondary | ICD-10-CM | POA: Diagnosis not present

## 2018-12-11 DIAGNOSIS — E032 Hypothyroidism due to medicaments and other exogenous substances: Secondary | ICD-10-CM | POA: Diagnosis not present

## 2018-12-11 DIAGNOSIS — N182 Chronic kidney disease, stage 2 (mild): Secondary | ICD-10-CM | POA: Diagnosis not present

## 2018-12-11 DIAGNOSIS — I10 Essential (primary) hypertension: Secondary | ICD-10-CM | POA: Diagnosis not present

## 2018-12-11 DIAGNOSIS — E1169 Type 2 diabetes mellitus with other specified complication: Secondary | ICD-10-CM | POA: Diagnosis not present

## 2018-12-23 ENCOUNTER — Telehealth (INDEPENDENT_AMBULATORY_CARE_PROVIDER_SITE_OTHER): Payer: Self-pay | Admitting: Orthopedic Surgery

## 2018-12-23 MED ORDER — AMOXICILLIN 500 MG PO TABS: ORAL_TABLET | ORAL | 0 refills | Status: DC

## 2018-12-23 NOTE — Telephone Encounter (Signed)
Patient advised.

## 2018-12-23 NOTE — Telephone Encounter (Signed)
Patient called stating that she has a dental appointment this coming Wednesday and needs an Antibiotic called into her pharmacy.  [facility] [address].  CB#[phone].  Thank you.

## 2018-12-23 NOTE — Telephone Encounter (Signed)
rx sent to pharmacy

## 2019-02-10 DIAGNOSIS — Z111 Encounter for screening for respiratory tuberculosis: Secondary | ICD-10-CM | POA: Diagnosis not present

## 2019-05-14 ENCOUNTER — Telehealth: Payer: Self-pay | Admitting: Orthopedic Surgery

## 2019-05-14 MED ORDER — AMOXICILLIN 500 MG PO TABS: ORAL_TABLET | ORAL | 0 refills | Status: DC

## 2019-05-14 NOTE — Telephone Encounter (Signed)
Patient called advised she is going to the dentist tomorrow at 11:00am and will need an antibiotic. Patient uses the [facility] on [address]. The number to contact patient is [phone]

## 2019-05-14 NOTE — Telephone Encounter (Signed)
rx submitted to pharmacy.

## 2019-08-26 ENCOUNTER — Other Ambulatory Visit: Payer: Self-pay | Admitting: Internal Medicine

## 2019-08-26 DIAGNOSIS — Z1231 Encounter for screening mammogram for malignant neoplasm of breast: Secondary | ICD-10-CM

## 2019-10-10 ENCOUNTER — Other Ambulatory Visit: Payer: Self-pay

## 2019-10-10 ENCOUNTER — Ambulatory Visit
Admission: RE | Admit: 2019-10-10 | Discharge: 2019-10-10 | Disposition: A | Discharge status: Home / Self Care | Payer: Medicare Other | Source: Ambulatory Visit | Attending: Internal Medicine | Admitting: Internal Medicine

## 2019-10-10 DIAGNOSIS — Z1231 Encounter for screening mammogram for malignant neoplasm of breast: Secondary | ICD-10-CM

## 2019-10-10 IMAGING — MG DIGITAL SCREENING BILAT W/ TOMO W/ CAD
8 series · 8 of 24 positions shown · non-contrast
Comparison: Previous exam(s).

CLINICAL DATA: Screening.

EXAM:
DIGITAL SCREENING BILATERAL MAMMOGRAM WITH TOMO AND CAD

[L CC synth-2D]
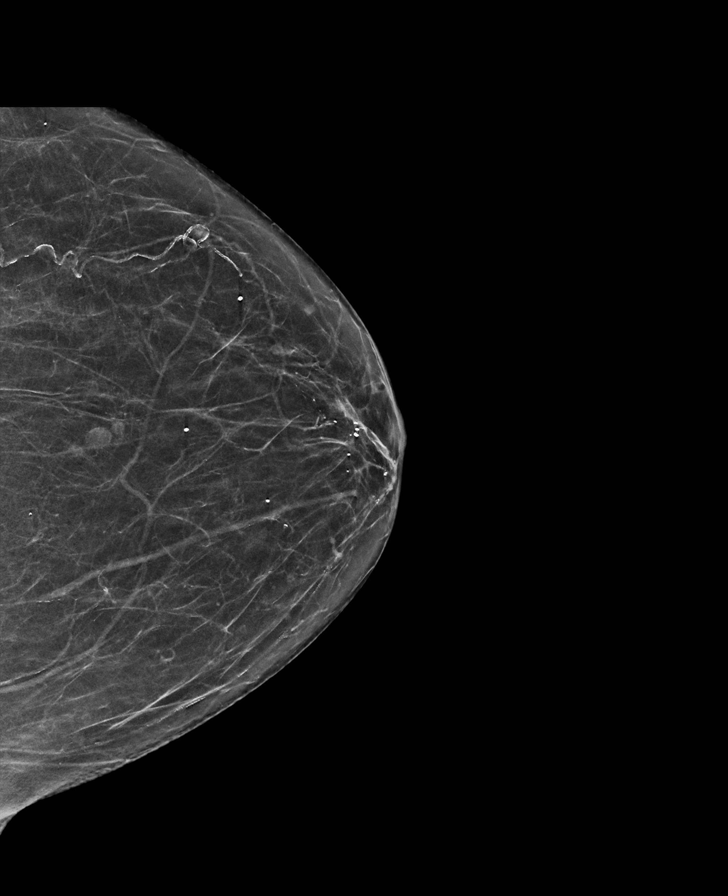

[L MLO synth-2D]
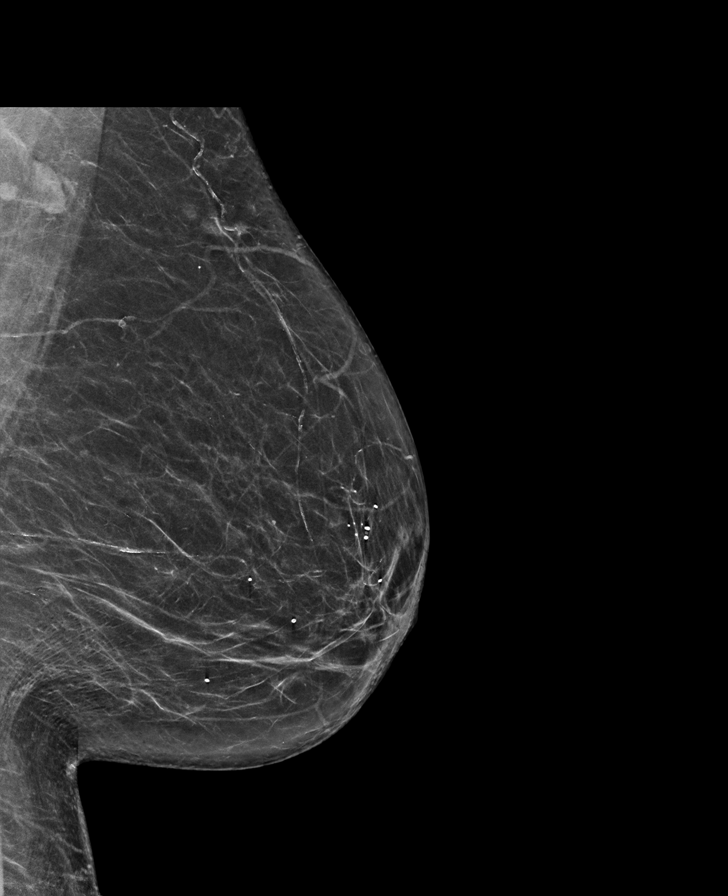

[R MLO synth-2D]
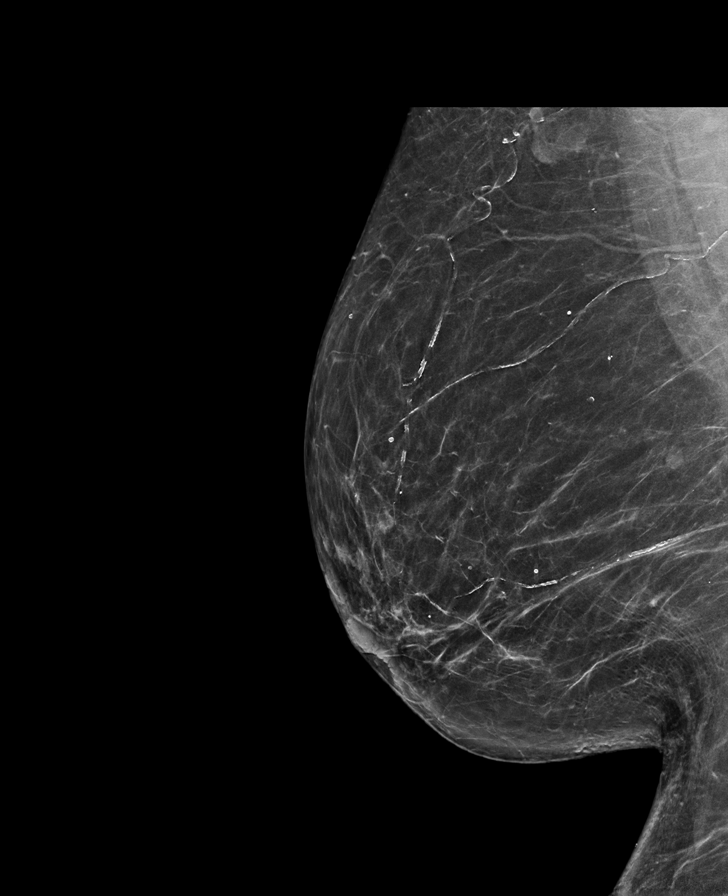

[R CC synth-2D]
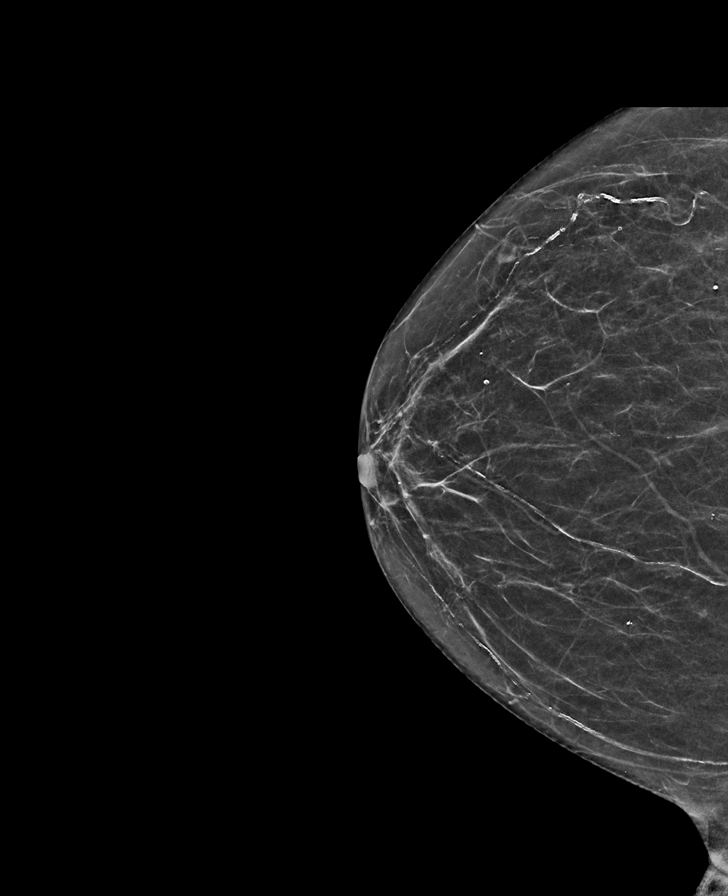

[L MLO tomo · tomo slice 34/67.0]
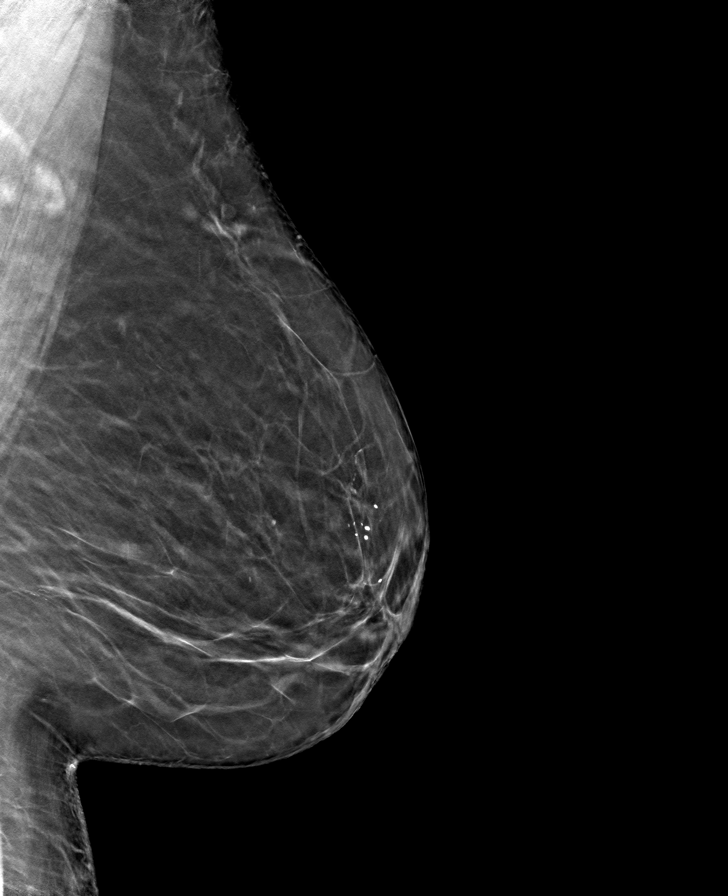

[R MLO tomo · tomo slice 37/73.0]
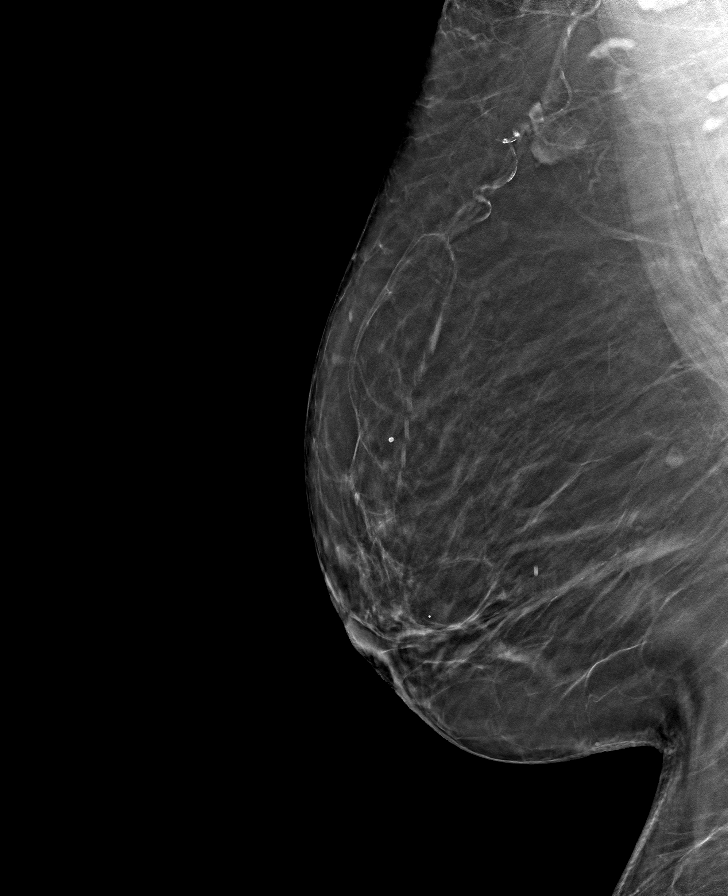

[R CC tomo · tomo slice 27/53.0]
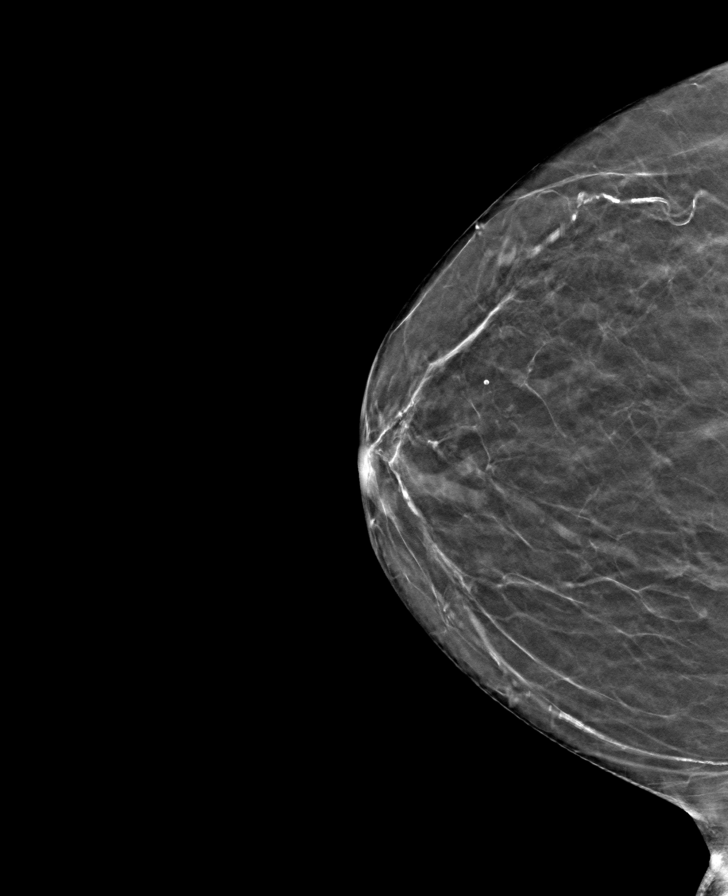

[L CC tomo · tomo slice 29/56.0]
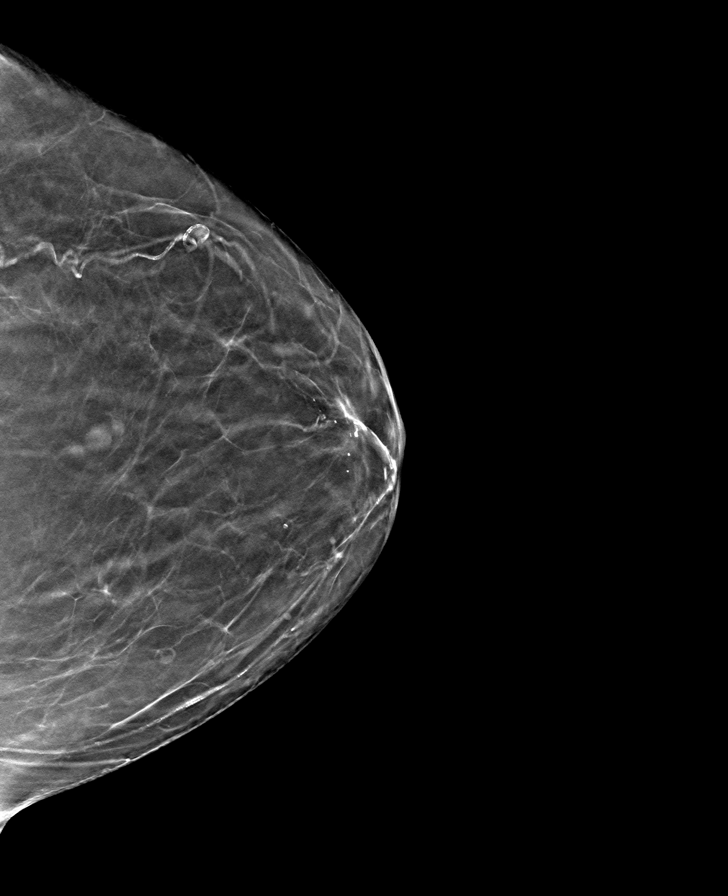

[8 of 24 positions shown; findings below may reference images not displayed]

ACR Breast Density Category b: There are scattered areas of
fibroglandular density.
FINDINGS: There are no findings suspicious for malignancy. Images were
processed with CAD.
IMPRESSION: No mammographic evidence of malignancy. A result letter of this
screening mammogram will be mailed directly to the patient.

RECOMMENDATION:
Screening mammogram in one year. (Code:[TQ])

BI-RADS CATEGORY  1: Negative.

## 2019-12-18 ENCOUNTER — Other Ambulatory Visit: Payer: Self-pay | Admitting: Cardiology

## 2019-12-18 DIAGNOSIS — Z20822 Contact with and (suspected) exposure to covid-19: Secondary | ICD-10-CM

## 2019-12-19 LAB — NOVEL CORONAVIRUS, NAA: SARS-CoV-2, NAA: NOT DETECTED

## 2020-02-02 ENCOUNTER — Other Ambulatory Visit: Payer: Self-pay | Admitting: Surgical

## 2020-02-02 ENCOUNTER — Telehealth: Payer: Self-pay | Admitting: Orthopedic Surgery

## 2020-02-02 MED ORDER — AMOXICILLIN 500 MG PO TABS: ORAL_TABLET | ORAL | 0 refills | Status: DC

## 2020-02-02 NOTE — Telephone Encounter (Signed)
Patient called.   She has a dentist appointment coming up and needs an antibiotic called in   Call back number: [phone]

## 2020-02-02 NOTE — Telephone Encounter (Signed)
Please advise thanks.

## 2020-02-02 NOTE — Telephone Encounter (Signed)
IC s/w patient advised done.

## 2020-09-13 ENCOUNTER — Other Ambulatory Visit: Payer: Self-pay

## 2020-09-13 ENCOUNTER — Other Ambulatory Visit: Payer: Self-pay | Admitting: Podiatry

## 2020-09-13 ENCOUNTER — Ambulatory Visit (INDEPENDENT_AMBULATORY_CARE_PROVIDER_SITE_OTHER): Payer: Medicare Other | Admitting: Podiatry

## 2020-09-13 ENCOUNTER — Ambulatory Visit (INDEPENDENT_AMBULATORY_CARE_PROVIDER_SITE_OTHER): Payer: Medicare Other

## 2020-09-13 DIAGNOSIS — M79672 Pain in left foot: Secondary | ICD-10-CM | POA: Diagnosis not present

## 2020-09-13 DIAGNOSIS — M76822 Posterior tibial tendinitis, left leg: Secondary | ICD-10-CM

## 2020-09-13 DIAGNOSIS — M2142 Flat foot [pes planus] (acquired), left foot: Secondary | ICD-10-CM | POA: Diagnosis not present

## 2020-09-13 DIAGNOSIS — M109 Gout, unspecified: Secondary | ICD-10-CM | POA: Diagnosis not present

## 2020-09-13 DIAGNOSIS — M1990 Unspecified osteoarthritis, unspecified site: Secondary | ICD-10-CM

## 2020-09-13 NOTE — Progress Notes (Signed)
    Subjective:  Patient ID: Mary Dalton, female    DOB: [birth year],  MRN: [id number]  Chief Complaint  Patient presents with  . Foot Pain    Left foot pain for about a month now. I cant walk good on it     64 y.o. female presents with the above complaint. History confirmed with patient. Hurts on the inside of the left ankle. 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 Has been advised to avoid NSAIDs  Objective:  Physical Exam: warm, good capillary refill, no trophic changes or ulcerative lesions, normal DP and PT pulses and normal sensory exam. Left Foot: pain on palpation over the PT tendon, with resisted eversion, and at the navicular. Localized edema in this location   Radiographs: X-ray of the left foot: moderate to severe HAV with gouty arthritic changes, TMT arthritis, and pes planus noted Assessment:   1. Posterior tibial tendinitis of left lower extremity   2. Foot pain, left   3. Acquired pes planus, left   4. Gouty arthritis of left foot      Plan:  Patient was evaluated and treated and all questions answered.   -XR reviewed with patient -Educated on stretching and icing of the affected limb.  -Trilock ankle brace and compression gauntlet sleeve dispensed -Recommended voltaren gel use as she cannot use oral NSAIDs -RICE protocol reviewed, recommend she rest for 2 weeks and begin exercises -If not improving by next visit will consider formal PT  Return in about 1 month (around 10/14/2020).

## 2020-09-13 NOTE — Patient Instructions (Signed)
Look for Voltaren gel at the pharmacy over the counter or online (also known as diclofenac 1% gel). Apply to the painful areas 3-4x daily with the supplied dosing card. Allow to dry for 10 minutes before going into socks/shoes    Posterior Tibial Tendinitis  Posterior tibial tendinitis is irritation of a tendon called the posterior tibial tendon. Your posterior tibial tendon is a cord-like tissue that connects bones of your lower leg and foot to a muscle that: 1. Supports your arch. 2. Helps you raise up on your toes. 3. Helps you turn your foot down and in. This condition causes foot and ankle pain. It can also lead to a flat foot. What are the causes? This condition is most often caused by repeated stress to the tendon (overuse injury). It can also be caused by a sudden injury that stresses the tendon, such as landing on your foot after jumping or falling. What increases the risk? This condition is more likely to develop in: 1. People who play a sport that involves putting a lot of pressure on the feet, such as: 1. Basketball. 2. Tennis. 3. Soccer. 4. Hockey. 2. Runners. 3. Females who are older than 66 years of age and are overweight. 4. People with diabetes. 5. People with decreased foot stability. 6. People with flat feet. What are the signs or symptoms? Symptoms include: 1. Pain in the inner ankle. 2. Pain at the arch of your foot. 3. Pain that gets worse with running, walking, or standing. 4. Swelling on the inside of your ankle and foot. 5. Weakness in your ankle or foot. 6. Inability to stand up on tiptoe. 7. Flattening of the arch of your foot. How is this diagnosed? This condition may be diagnosed based on: 1. Your symptoms. 2. Your medical history. 3. A physical exam. 4. Tests, such as: 1. X-ray. 2. MRI. 3. Ultrasound. How is this treated? This condition may be treated by: 1. Putting ice to the injured area. 2. Taking NSAIDs, such as ibuprofen, to reduce pain  and swelling. 3. Wearing a special shoe or shoe insert to support your arch (orthotic). 4. Having physical therapy. 5. Replacing high-impact exercise with low-impact exercise, such as swimming or cycling. If your symptoms do not improve with these treatments, you may need to wear a splint, removable walking boot, or short leg cast for 6-8 weeks to keep your foot and ankle still (immobilized). Follow these instructions at home: If you have a cast, splint, or boot:  Keep it clean and dry.  Check the skin around it every day. Tell your health care provider about any concerns. If you have a cast:  Do not stick anything inside it to scratch your skin. Doing that increases your risk of infection.  You may put lotion on dry skin around the edges of the cast. Do not put lotion on the skin underneath the cast. If you have a splint or boot:  Wear it as told by your health care provider. Remove it only as told by your health care provider.  Loosen it if your toes tingle, become numb, or turn cold and blue. Bathing 1. Do not take baths, swim, or use a hot tub until your health care provider approves. Ask your health care provider if you may take showers. 2. If your cast, splint, or boot is not waterproof: ? Do not let it get wet. ? Cover it with a waterproof covering while you take a bath or a shower. Managing pain and   swelling   1. If directed, put ice on the injured area. ? If you have a removable splint or boot, remove it as told by your health care provider. ? Put ice in a plastic bag. ? Place a towel between your skin and the bag or between your cast and the bag. ? Leave the ice on for 20 minutes, 2-3 times a day. 2. Move your toes often to reduce stiffness and swelling. 3. Raise (elevate) the injured area above the level of your heart while you are sitting or lying down. Activity  Do not use the injured foot to support your body weight until your health care provider says that you  can. Use crutches as told by your health care provider.  Do not do activities that make pain or swelling worse.  Ask your health care provider when it is safe to drive if you have a cast, splint, or boot on your foot.  Return to your normal activities as told by your health care provider. Ask your health care provider what activities are safe for you.  Do exercises as told by your health care provider. General instructions  Take over-the-counter and prescription medicines only as told by your health care provider.  If you have an orthotic, use it as told by your health care provider.  Keep all follow-up visits as told by your health care provider. This is important. How is this prevented?  Wear footwear that is appropriate to your athletic activity.  Avoid athletic activities that cause pain or swelling in your ankle or foot.  Before being active, do range-of-motion and stretching exercises.  If you develop pain or swelling while training, stop training.  If you have pain or swelling that does not improve after a few days of rest, see your health care provider.  If you start a new athletic activity, start gradually so you can build up your strength and flexibility. Contact a health care provider if:  Your symptoms get worse.  Your symptoms do not improve in 6-8 weeks.  You develop new, unexplained symptoms.  Your splint, boot, or cast gets damaged. Summary  Posterior tibial tendinitis is irritation of a tendon called the posterior tibial tendon.  This condition is most often caused by repeated stress to the tendon (overuse injury).  This condition causes foot pain and ankle pain. It can also lead to a flat foot.  This condition may be treated by not doing high-impact activities, applying ice, having physical therapy, wearing orthotics, and wearing a cast, splint, or boot if needed. This information is not intended to replace advice given to you by your health care  provider. Make sure you discuss any questions you have with your health care provider. Document Revised: 03/18/2019 Document Reviewed: 01/23/2019 [employer] Patient Education  2020 [employer]  Posterior Tibial Tendinitis Rehab Ask your health care provider which exercises are safe for you. Do exercises exactly as told by your health care provider and adjust them as directed. It is normal to feel mild stretching, pulling, tightness, or discomfort as you do these exercises. Stop right away if you feel sudden pain or your pain gets worse. Do not begin these exercises until told by your health care provider. Stretching and range-of-motion exercises These exercises warm up your muscles and joints and improve the movement and flexibility in your ankle and foot. These exercises may also help to relieve pain. Standing wall calf stretch, knee straight   4. Stand with your hands against a wall.   5. Extend your left / right leg behind you, and bend your front knee slightly. If directed, place a folded washcloth under the arch of your foot for support. 6. Point the toes of your back foot slightly inward. 7. Keeping your heels on the floor and your back knee straight, shift your weight toward the wall. Do not allow your back to arch. You should feel a gentle stretch in your upper left / right calf. 8. Hold this position for 10 seconds. Repeat 10 times. Complete this exercise 2 times a day. Standing wall calf stretch, knee bent 7. Stand with your hands against a wall. 8. Extend your left / right leg behind you, and bend your front knee slightly. If directed, place a folded washcloth under the arch of your foot for support. 9. Point the toes of your back foot slightly inward. 10. Unlock your back knee so it is bent. Keep your heels on the floor. You should feel a gentle stretch deep in your lower left / right calf. 11. Hold this position for 10 seconds. Repeat 10 times. Complete this exercise 2 times a  day. Strengthening exercises These exercises build strength and endurance in your ankle and foot. Endurance is the ability to use your muscles for a long time, even after they get tired. Ankle inversion with band 8. Secure one end of a rubber exercise band or tubing to a fixed object, such as a table leg or a pole, that will stay still when the band is pulled. 9. Loop the other end of the band around the middle of your left / right foot. 10. Sit on the floor facing the object with your left / right leg extended. The band or tube should be slightly tense when your foot is relaxed. 11. Leading with your big toe, slowly bring your left / right foot and ankle inward, toward your other foot (inversion). 12. Hold this position for 10 seconds. 13. Slowly return your foot to the starting position. Repeat 10 times. Complete this exercise 2 times a day. Towel curls   5. Sit in a chair on a non-carpeted surface, and put your feet on the floor. 6. Place a towel in front of your feet. 7. Keeping your heel on the floor, put your left / right foot on the towel. 8. Pull the towel toward you by grabbing the towel with your toes and curling them under. Keep your heel on the floor while you do this. 9. Let your toes relax. 10. Grab the towel with your toes again. Keep going until the towel is completely underneath your foot. Repeat 10 times. Complete this exercise 2 times a day. Balance exercise This exercise improves or maintains your balance. Balance is important in preventing falls. Single leg stand 6. Without wearing shoes, stand near a railing or in a doorway. You may hold on to the railing or door frame as needed for balance. 7. Stand on your left / right foot. Keep your big toe down on the floor and try to keep your arch lifted. ? If balancing in this position is too easy, try the exercise with your eyes closed or while standing on a pillow. 8. Hold this position for 10 seconds. Repeat 10 times.  Complete this exercise 2 times a day. This information is not intended to replace advice given to you by your health care provider. Make sure you discuss any questions you have with your health care provider.  

## 2020-10-04 ENCOUNTER — Other Ambulatory Visit: Payer: Self-pay | Admitting: Internal Medicine

## 2020-10-04 DIAGNOSIS — Z1231 Encounter for screening mammogram for malignant neoplasm of breast: Secondary | ICD-10-CM

## 2020-10-07 ENCOUNTER — Ambulatory Visit: Payer: Medicare Other

## 2020-10-11 ENCOUNTER — Ambulatory Visit: Payer: Medicare Other | Admitting: Podiatry

## 2020-12-18 DIAGNOSIS — Z20822 Contact with and (suspected) exposure to covid-19: Secondary | ICD-10-CM | POA: Diagnosis not present

## 2021-01-10 ENCOUNTER — Other Ambulatory Visit: Payer: Self-pay

## 2021-01-10 ENCOUNTER — Ambulatory Visit
Admission: RE | Admit: 2021-01-10 | Discharge: 2021-01-10 | Disposition: A | Discharge status: Home / Self Care | Payer: Medicare HMO | Source: Ambulatory Visit | Attending: Internal Medicine | Admitting: Internal Medicine

## 2021-01-10 DIAGNOSIS — Z1231 Encounter for screening mammogram for malignant neoplasm of breast: Secondary | ICD-10-CM | POA: Diagnosis not present

## 2021-01-10 IMAGING — MG MM DIGITAL SCREENING BILAT W/ TOMO AND CAD
6 of 10 series · 6 of 30 positions shown · non-contrast
Comparison: Previous exam(s).

CLINICAL DATA: Screening.

EXAM:
DIGITAL SCREENING BILATERAL MAMMOGRAM WITH TOMOSYNTHESIS AND CAD
TECHNIQUE: Bilateral screening digital craniocaudal and mediolateral oblique
mammograms were obtained. Bilateral screening digital breast
tomosynthesis was performed. The images were evaluated with
computer-aided detection.

[R CC synth-2D]
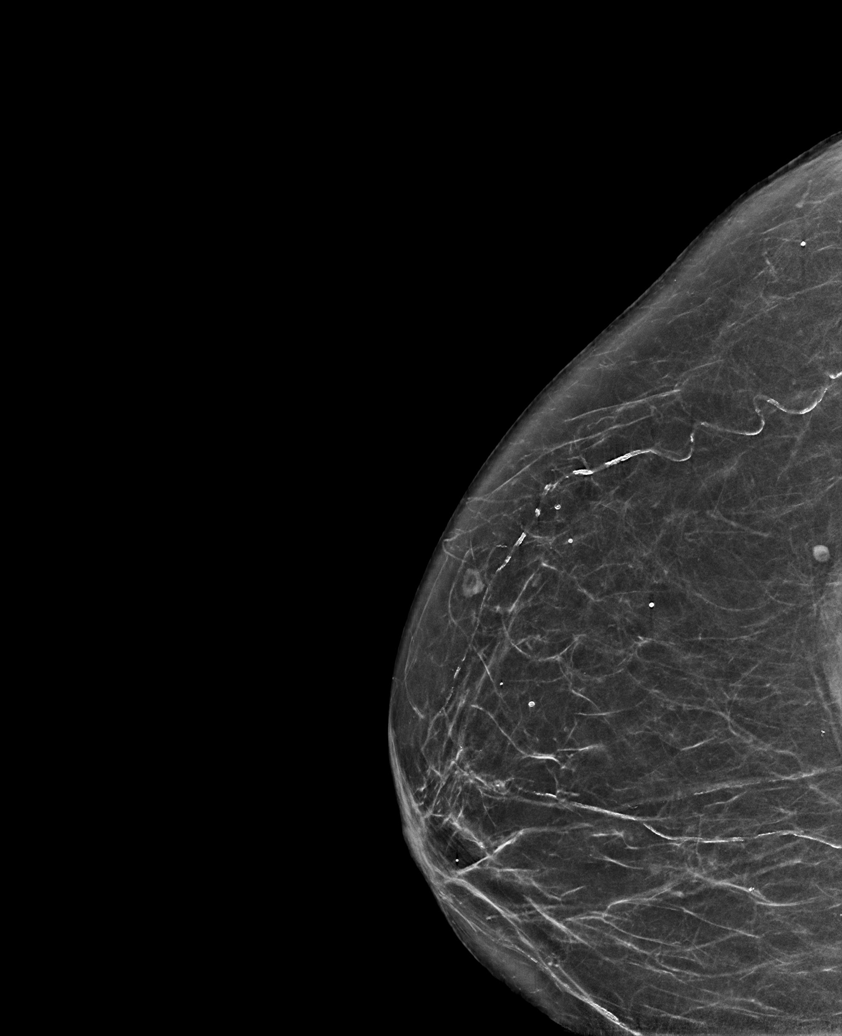

[L MLO synth-2D]
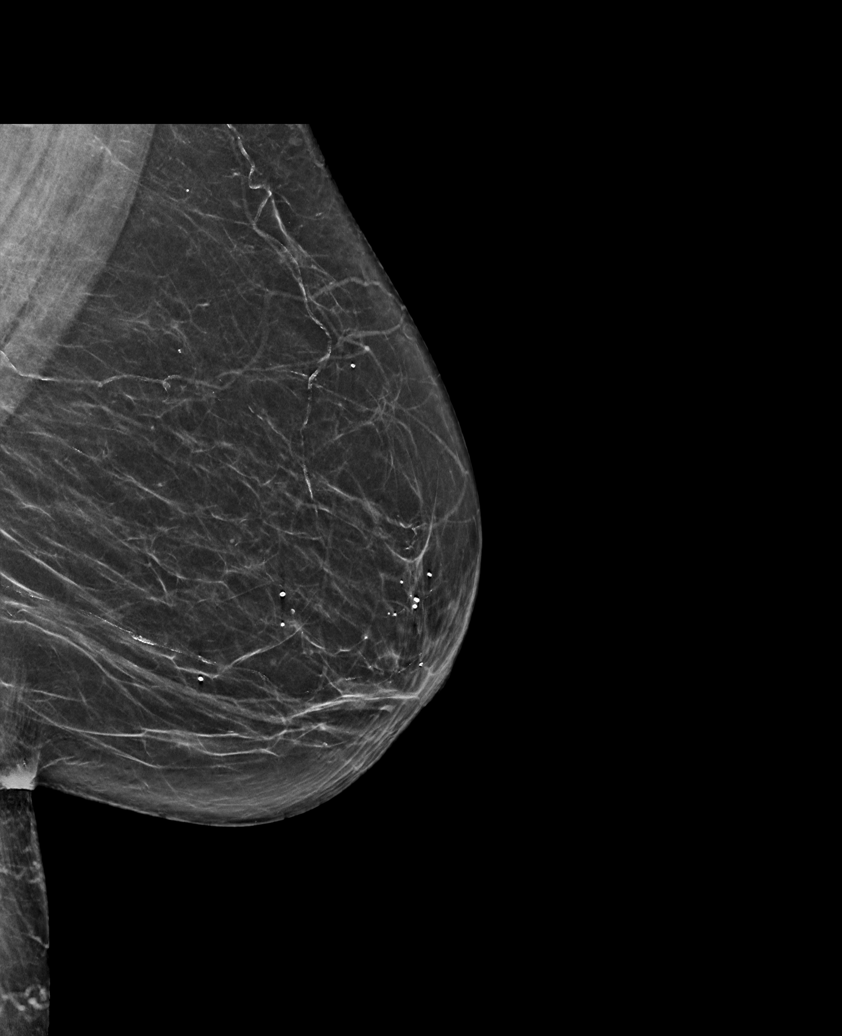

[R MLO synth-2D]
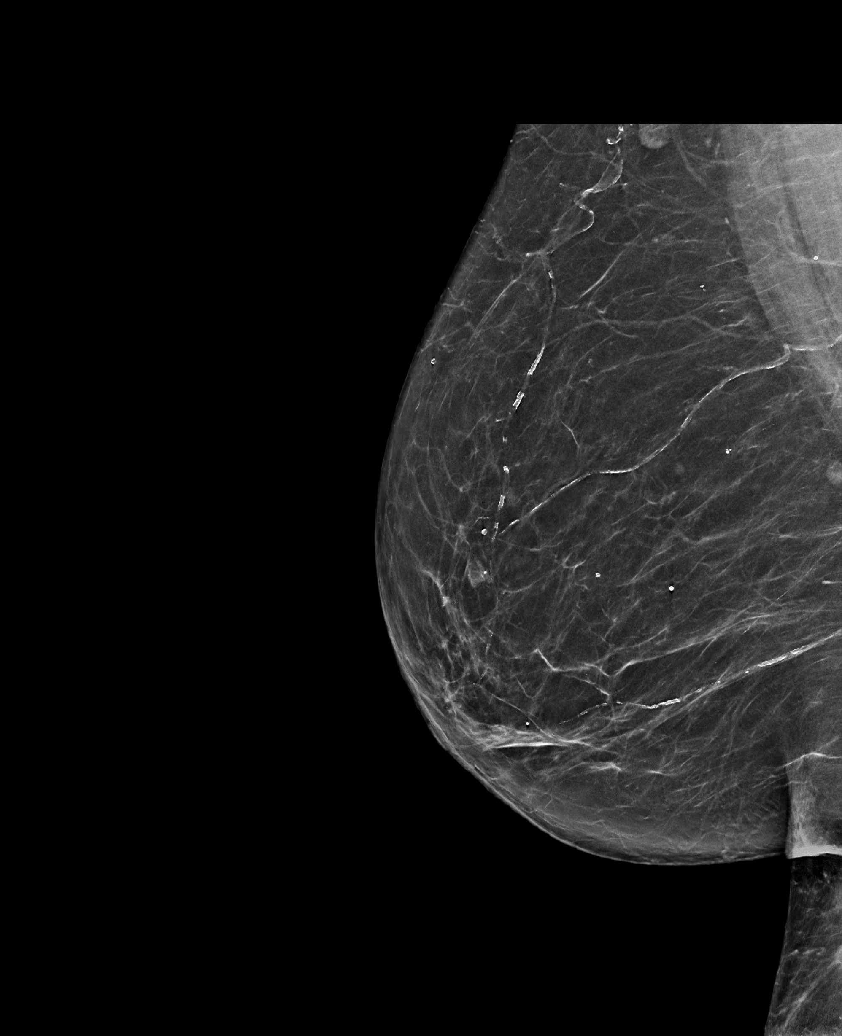

[L CC synth-2D]
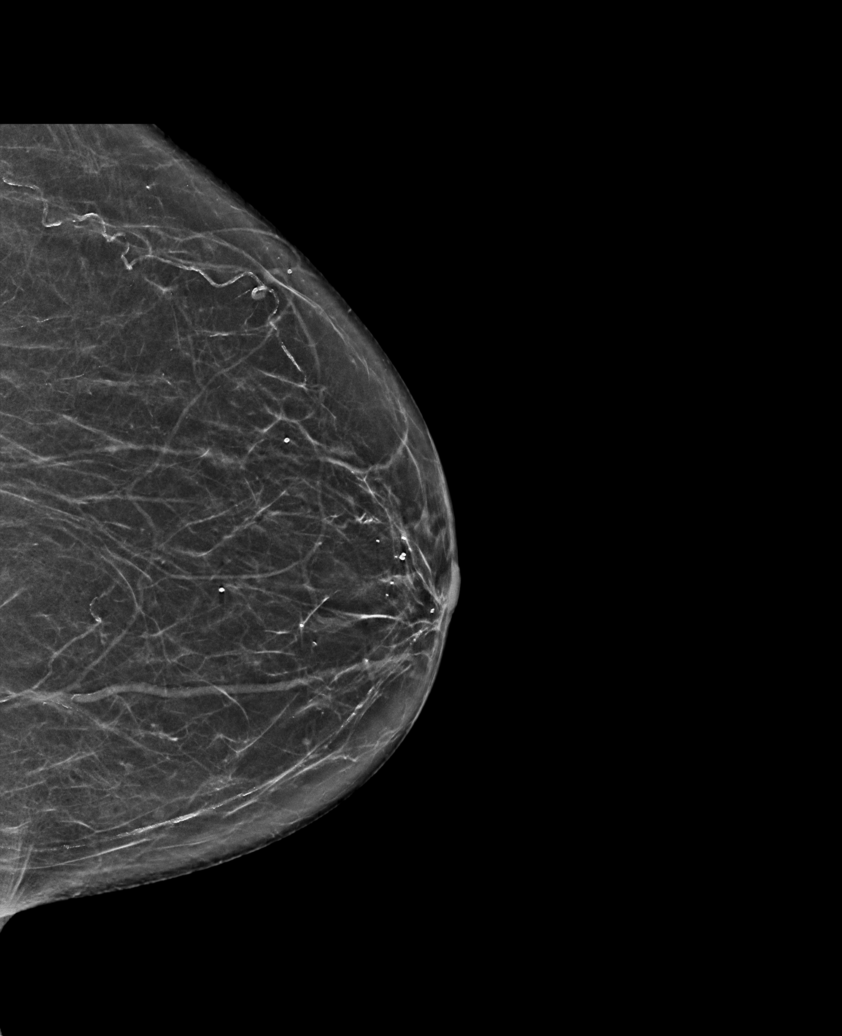

[R CV synth-2D]
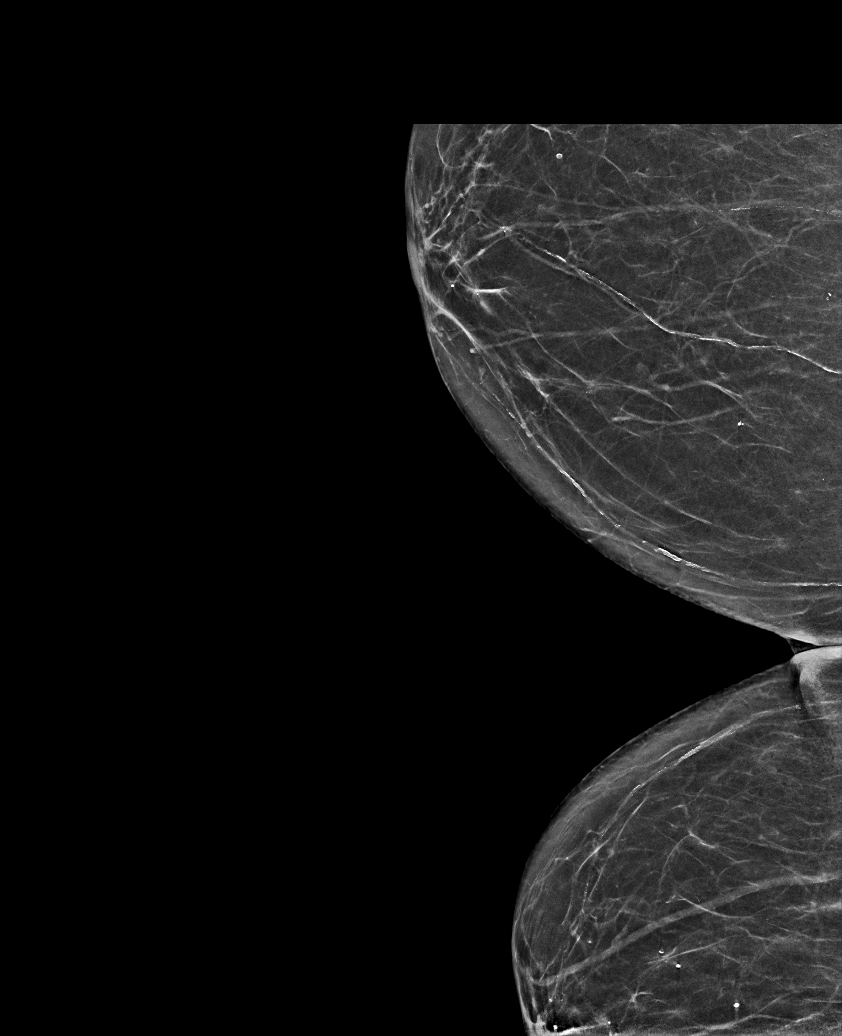

[L CC tomo · tomo slice 30/59.0]
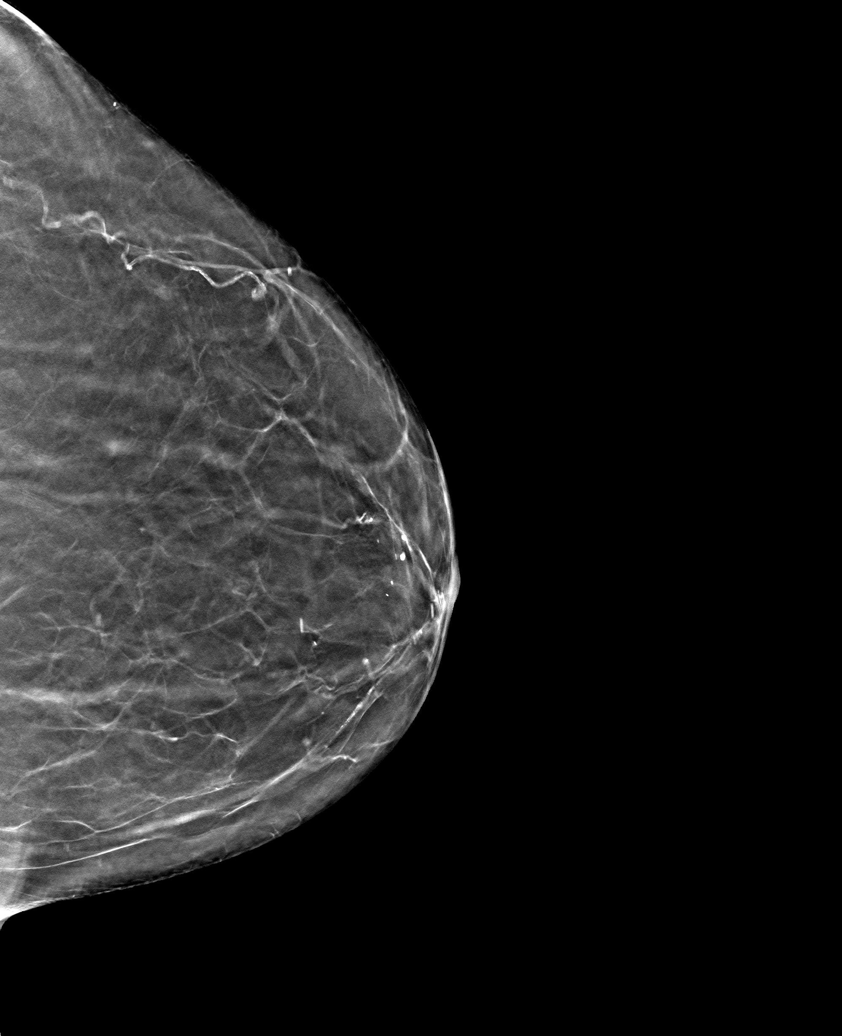

[6 of 30 positions shown; findings below may reference images not displayed]

ACR Breast Density Category b: There are scattered areas of
fibroglandular density.
FINDINGS: There are no findings suspicious for malignancy.
IMPRESSION: No mammographic evidence of malignancy. A result letter of this
screening mammogram will be mailed directly to the patient.

RECOMMENDATION:
Screening mammogram in one year. (Code:[BY])

BI-RADS CATEGORY  1: Negative.

## 2021-01-14 DIAGNOSIS — E1169 Type 2 diabetes mellitus with other specified complication: Secondary | ICD-10-CM | POA: Diagnosis not present

## 2021-01-14 DIAGNOSIS — L309 Dermatitis, unspecified: Secondary | ICD-10-CM | POA: Diagnosis not present

## 2021-05-11 DIAGNOSIS — Z0100 Encounter for examination of eyes and vision without abnormal findings: Secondary | ICD-10-CM | POA: Diagnosis not present

## 2021-05-11 DIAGNOSIS — H5200 Hypermetropia, unspecified eye: Secondary | ICD-10-CM | POA: Diagnosis not present

## 2021-08-23 DIAGNOSIS — E039 Hypothyroidism, unspecified: Secondary | ICD-10-CM | POA: Diagnosis not present

## 2021-08-23 DIAGNOSIS — E785 Hyperlipidemia, unspecified: Secondary | ICD-10-CM | POA: Diagnosis not present

## 2021-08-23 DIAGNOSIS — M109 Gout, unspecified: Secondary | ICD-10-CM | POA: Diagnosis not present

## 2021-08-23 DIAGNOSIS — E1169 Type 2 diabetes mellitus with other specified complication: Secondary | ICD-10-CM | POA: Diagnosis not present

## 2021-08-30 DIAGNOSIS — Z0000 Encounter for general adult medical examination without abnormal findings: Secondary | ICD-10-CM | POA: Diagnosis not present

## 2021-08-30 DIAGNOSIS — N951 Menopausal and female climacteric states: Secondary | ICD-10-CM | POA: Diagnosis not present

## 2021-08-30 DIAGNOSIS — Z780 Asymptomatic menopausal state: Secondary | ICD-10-CM | POA: Diagnosis not present

## 2021-08-30 DIAGNOSIS — Z1331 Encounter for screening for depression: Secondary | ICD-10-CM | POA: Diagnosis not present

## 2021-08-30 DIAGNOSIS — I2510 Atherosclerotic heart disease of native coronary artery without angina pectoris: Secondary | ICD-10-CM | POA: Diagnosis not present

## 2021-08-30 DIAGNOSIS — E1169 Type 2 diabetes mellitus with other specified complication: Secondary | ICD-10-CM | POA: Diagnosis not present

## 2021-08-30 DIAGNOSIS — I10 Essential (primary) hypertension: Secondary | ICD-10-CM | POA: Diagnosis not present

## 2021-08-30 DIAGNOSIS — E785 Hyperlipidemia, unspecified: Secondary | ICD-10-CM | POA: Diagnosis not present

## 2021-08-30 DIAGNOSIS — Z1339 Encounter for screening examination for other mental health and behavioral disorders: Secondary | ICD-10-CM | POA: Diagnosis not present

## 2021-08-30 DIAGNOSIS — E039 Hypothyroidism, unspecified: Secondary | ICD-10-CM | POA: Diagnosis not present

## 2021-09-22 ENCOUNTER — Other Ambulatory Visit: Payer: Self-pay

## 2021-09-22 ENCOUNTER — Ambulatory Visit: Payer: Medicare HMO | Admitting: Podiatry

## 2021-09-22 ENCOUNTER — Ambulatory Visit (INDEPENDENT_AMBULATORY_CARE_PROVIDER_SITE_OTHER): Payer: Medicare HMO

## 2021-09-22 DIAGNOSIS — M21611 Bunion of right foot: Secondary | ICD-10-CM

## 2021-09-22 DIAGNOSIS — M19079 Primary osteoarthritis, unspecified ankle and foot: Secondary | ICD-10-CM | POA: Diagnosis not present

## 2021-09-22 DIAGNOSIS — M2042 Other hammer toe(s) (acquired), left foot: Secondary | ICD-10-CM

## 2021-09-22 DIAGNOSIS — M21612 Bunion of left foot: Secondary | ICD-10-CM

## 2021-09-22 DIAGNOSIS — M2041 Other hammer toe(s) (acquired), right foot: Secondary | ICD-10-CM | POA: Diagnosis not present

## 2021-09-22 DIAGNOSIS — M19072 Primary osteoarthritis, left ankle and foot: Secondary | ICD-10-CM

## 2021-09-22 NOTE — Progress Notes (Signed)
  Subjective:  Patient ID: Mary Dalton, female    DOB: 05/04/1956,  MRN: [id number]  Chief Complaint  Patient presents with   Bunions      Painful bilateral bunions    65 y.o. female presents with the above complaint. History confirmed with patient.  Returns with a new complaint of increasing pain in her bunion deformity.  She is obviously had these for many years but they are becoming increasingly bothersome.  Worst in shoe gears but starting to pain in the joint as well as the midfoot on the left side as well.  There is a large bone spur growing as well.  Objective:  Physical Exam: warm, good capillary refill, no trophic changes or ulcerative lesions, normal DP and PT pulses, and normal sensory exam.  Bilateral she has severe hallux valgus deformity with the hallux abutting the second toe and rubbing a callus on the second hammertoe as well.  Palpable dorsal spurring at the MTPJ on the left foot and TMT J on the left foot second as well.  Radiographs: Multiple views x-ray of both feet: Moderate to severe hallux valgus with arthritic changes within the metatarsophalangeal joint bilateral, looks like previous Lisfranc fracture and osteoarthritis of second TMT J on the left foot Assessment:   1. Bilateral bunions   2. Osteoarthritis of first metatarsophalangeal joint   3. Arthritis of midtarsal joint of left foot      Plan:  Patient was evaluated and treated and all questions answered.  Reviewed etiology and treatment options of bunion deformity and hammertoes as well.  Discussed with her multiple surgical options.  She is beginning to get arthritis developing in the metatarsophalangeal joint.  I think likely she will need a first metatarsophalangeal joint arthrodesis.  Complicating this is posttraumatic arthritis of the left midfoot I am ordering a CT scan of the foot to evaluate this for surgical planning she may require arthrodesis of the second and third tarsometatarsal joints as  well.  She will follow with me after the CT scan for evaluation.  Likely will need second hammertoe correction as well con commitment with the bunion correction.  We discussed the postoperative course risk benefits and position complication of surgery and we will discuss this further at her next visit as well before informed consent is signed.  Return for after CT scan .

## 2021-10-07 ENCOUNTER — Ambulatory Visit
Admission: RE | Admit: 2021-10-07 | Discharge: 2021-10-07 | Disposition: A | Discharge status: Home / Self Care | Payer: Medicare HMO | Source: Ambulatory Visit | Attending: Podiatry | Admitting: Podiatry

## 2021-10-07 ENCOUNTER — Other Ambulatory Visit: Payer: Self-pay

## 2021-10-07 DIAGNOSIS — M2012 Hallux valgus (acquired), left foot: Secondary | ICD-10-CM | POA: Diagnosis not present

## 2021-10-07 DIAGNOSIS — M7732 Calcaneal spur, left foot: Secondary | ICD-10-CM | POA: Diagnosis not present

## 2021-10-07 DIAGNOSIS — G8929 Other chronic pain: Secondary | ICD-10-CM | POA: Diagnosis not present

## 2021-10-07 DIAGNOSIS — M19072 Primary osteoarthritis, left ankle and foot: Secondary | ICD-10-CM | POA: Diagnosis not present

## 2021-10-07 DIAGNOSIS — M19079 Primary osteoarthritis, unspecified ankle and foot: Secondary | ICD-10-CM

## 2021-10-07 IMAGING — CT CT FOOT*L* W/O CM
3 series · 9 of 33 positions shown, 10 images · non-contrast
Comparison: Radiographs [DATE]

CLINICAL DATA: Chronic foot pain

EXAM:
CT OF THE LEFT FOOT WITHOUT CONTRAST
TECHNIQUE: Multidetector CT imaging of the left foot was performed according to
the standard protocol. Multiplanar CT image reconstructions were
also generated.

[Series 4: soft tissue lower extremity · axial · 0.57mm/px · z∈[-842,-842]mm · 1 of 81 slices shown, 2 images]
[im 44/81  soft-tissue]
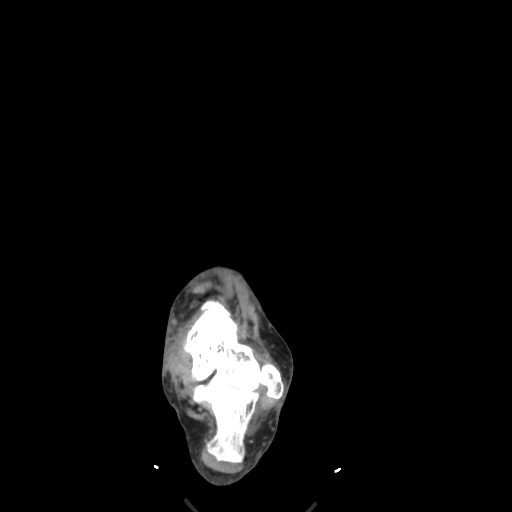
[im 44/81  bone]
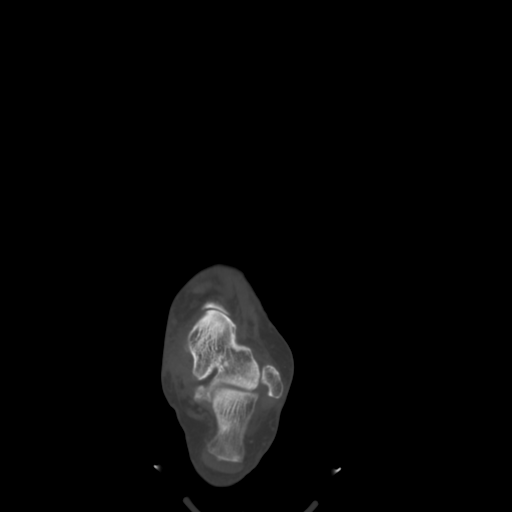

[Series 9: cor soft tissue · coronal · 0.23mm/px · 3 of 134 slices shown]
[im 27/134  bone]
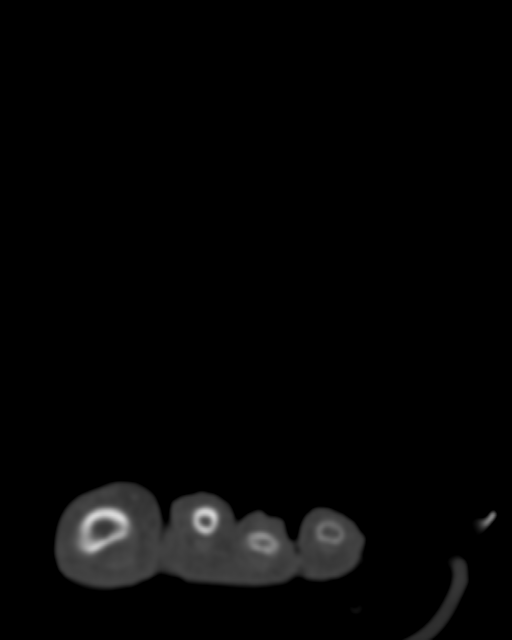
[im 54/134  bone]
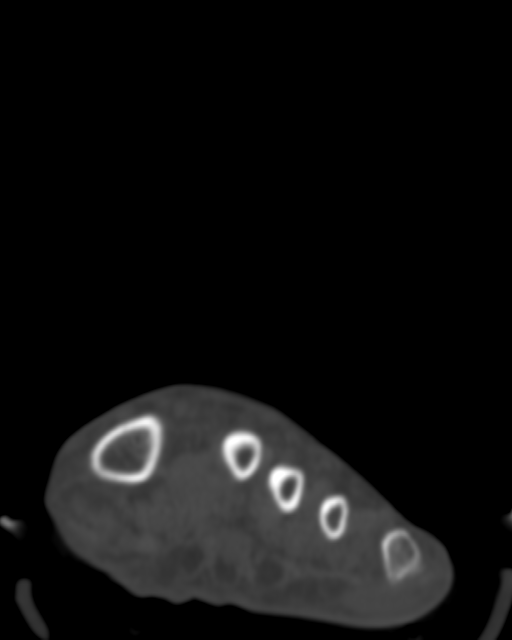
[im 80/134  bone]
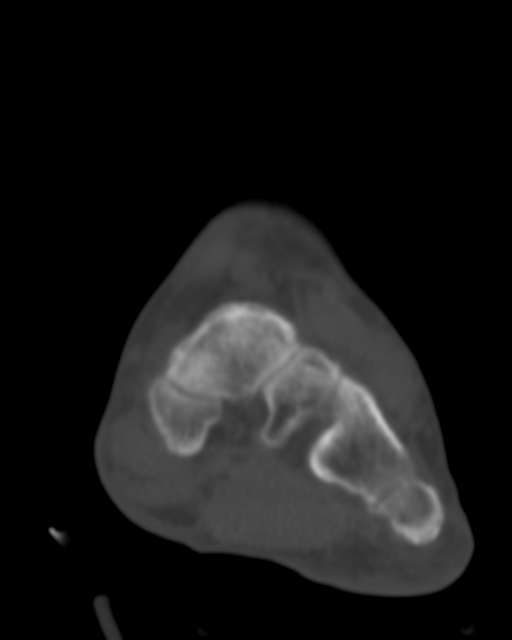

[Series 10: sagsoft tissue · sagittal · 0.23mm/px · 5 of 63 slices shown]
[im 21/63  bone]
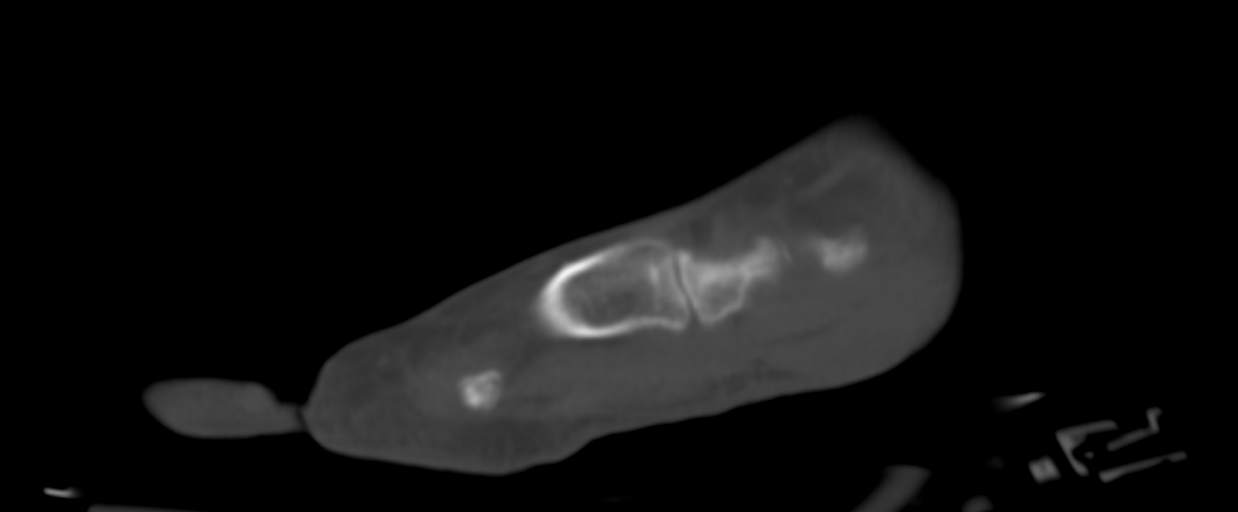
[im 26/63  bone]
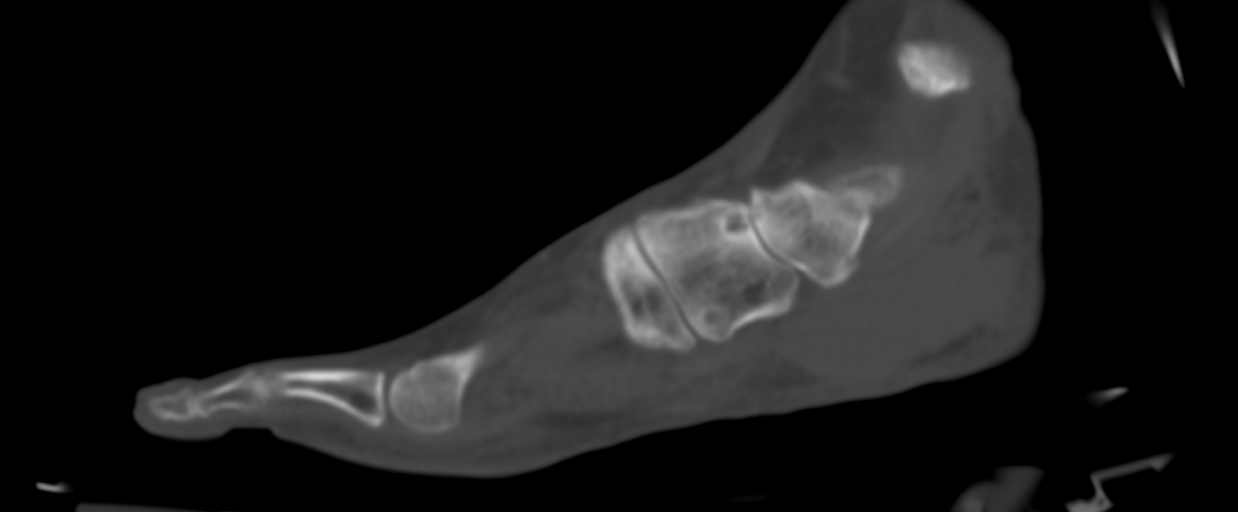
[im 32/63  bone]
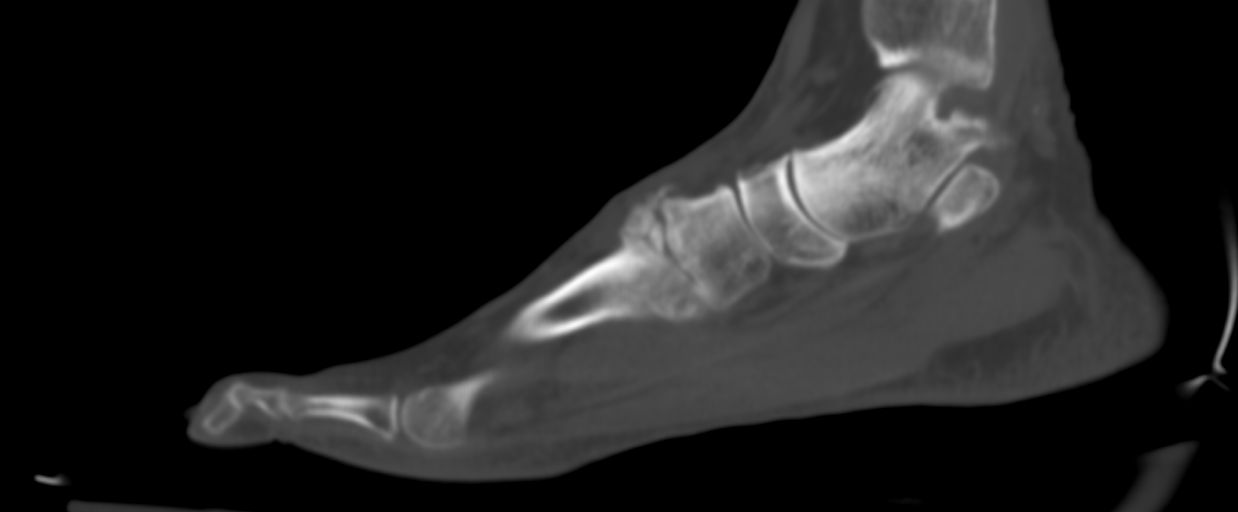
[im 37/63  bone]
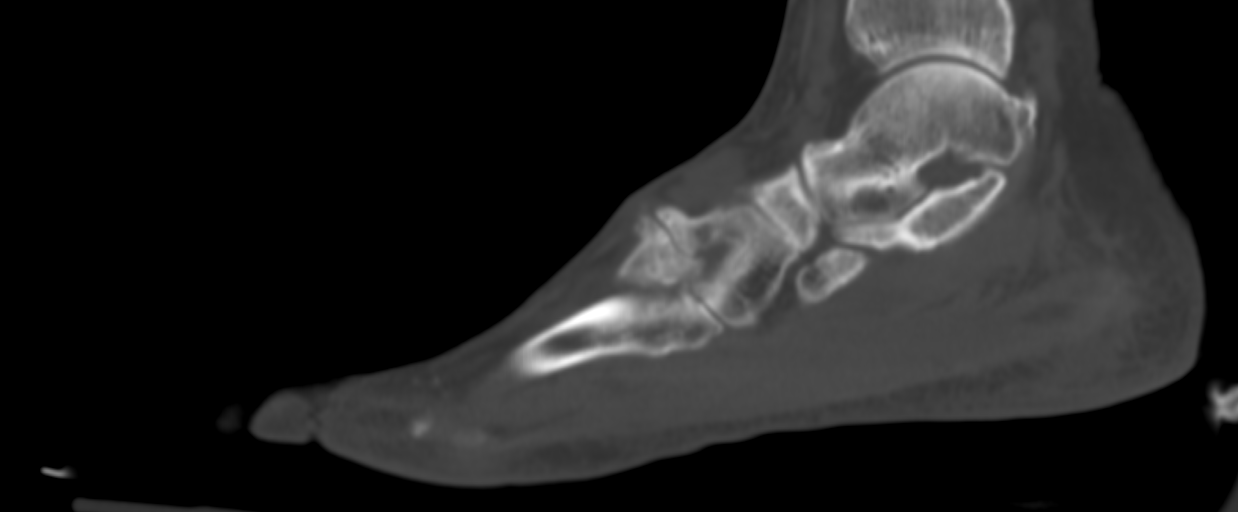
[im 42/63  bone]
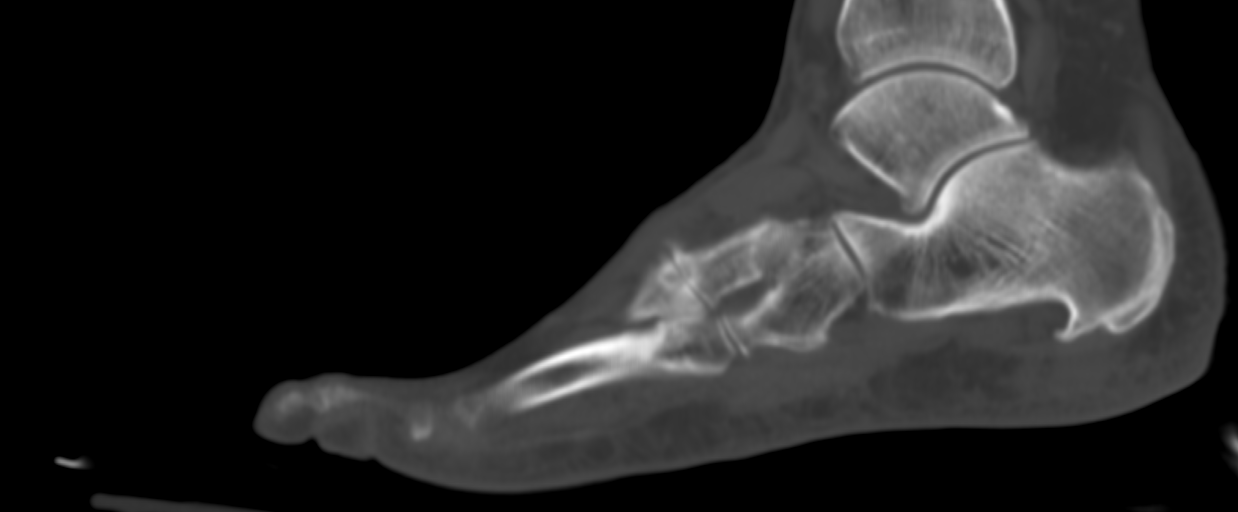

[9 of 33 positions shown; findings below may reference images not displayed]

FINDINGS: Bones/Joint/Cartilage

Corticated fragments along the volar-medial margin of the base of
the second metatarsal noted, potentially representing part of the
attachment of the Lisfranc ligament. There is expected alignment at
the Lisfranc joint. Substantial osteoarthritis with prominent
articular space narrowing and confluent degenerative subcortical
cyst formation as well as marginal osteophytes between the middle
cuneiform and the base of the second metatarsal. Moderate to
prominent osteoarthritis between the lateral cuneiform and the base
of the third metatarsal. Mild to moderate osteoarthritis with loss
of articular space between the cuboid and fourth and fifth
metatarsal bases, with degenerative subcortical cyst formation in
the distal cuboid adjacent to the fourth metatarsal articulation.
There is some mild loss of articular space at the articulation
between the medial cuneiform and the base of the first metatarsal.
Given the lack of malalignment, I am skeptical of overt Lisfranc
joint instability.

Moderate degenerative arthropathy at the first MTP joint with more
striking osteoarthritis between the first metatarsal head in the
sesamoids, with associated spurring and loss of articular cartilage.
Mild hallux valgus with bony bunion. Medial cortical irregularity
along the head of the first metatarsal, cannot exclude a small
tophus in the setting of gout arthropathy.

Degenerative subcortical cyst proximally in the medial cuneiform
with mild degenerative midfoot chondral thinning.

Plantar calcaneal spur.

Loss of height of the normal longitudinal arch of the foot raising
concern for pes planus.

Ligaments

Suboptimally assessed by CT. Suspected thickening of the
superomedial portion of the spring ligament for example on image 39
series 4.

Muscles and Tendons

Expanded and indistinct distal tibialis posterior tendon, possible
tibialis posterior tendinopathy. Mild fusiform prominence of the
distal Achilles tendon suggesting Achilles tendinopathy.

Soft tissues

Nonspecific edema in the subcutaneous tissues plantar to the fifth
MTP joint.
IMPRESSION: 1. Varying degrees of osteoarthritis at the Lisfranc joint, most
severe between the middle cuneiform and the base of the second
metatarsal. Small chronic well corticated bony fragments along the
dorsal-medial margin of the base of the second metatarsal may
partially correspond to attachment sites of the Lisfranc ligament
but most of the Lisfranc ligament attachment is along the main
shaft, and there is no malalignment at the Lisfranc joint to suggest
Lisfranc joint instability.
2. Moderate degenerative arthropathy of the first MTP joint and in
particular between the head of the first metatarsal and the
sesamoids. There is mild hallux valgus and bony bunion along with
some mild cortical irregularity along the medial head of the first
metatarsal, cannot exclude a small tophus in the setting of gout
arthropathy.
3. Loss of height of the normal longitudinal arch of the foot
concerning for pes planus. There is expansion of the distal tibialis
posterior tendon suspicious for tendinopathy, as well as some
thickening of the superomedial portion of the spring ligament.
4. Mild distal Achilles tendinopathy.
5. Nonspecific edema in the subcutaneous tissues plantar to the
fifth MTP joint.
6. Mild degenerative midfoot arthropathy.
7. Plantar calcaneal spur.

## 2021-10-13 ENCOUNTER — Ambulatory Visit: Payer: Medicare HMO | Admitting: Podiatry

## 2021-10-13 ENCOUNTER — Other Ambulatory Visit: Payer: Self-pay

## 2021-10-13 DIAGNOSIS — M19079 Primary osteoarthritis, unspecified ankle and foot: Secondary | ICD-10-CM

## 2021-10-13 DIAGNOSIS — M8580 Other specified disorders of bone density and structure, unspecified site: Secondary | ICD-10-CM | POA: Diagnosis not present

## 2021-10-13 DIAGNOSIS — M21611 Bunion of right foot: Secondary | ICD-10-CM | POA: Diagnosis not present

## 2021-10-13 DIAGNOSIS — M21612 Bunion of left foot: Secondary | ICD-10-CM | POA: Diagnosis not present

## 2021-10-13 DIAGNOSIS — M19072 Primary osteoarthritis, left ankle and foot: Secondary | ICD-10-CM | POA: Diagnosis not present

## 2021-10-13 NOTE — Patient Instructions (Signed)
Procedures we talked about:  Fusion of joints of the midfoot (arthrodesis, Lapidus procedure)  Great toe joint replacement

## 2021-10-17 NOTE — Progress Notes (Signed)
Subjective:  Patient ID: Mary Dalton, female    DOB: October 04, 1956,  MRN: [id number]  Chief Complaint  Patient presents with   Tendonitis    Left - CT results    65 y.o. female presents with the above complaint. History confirmed with patient.  Returns with a new complaint of increasing pain in her bunion deformity.  She is obviously had these for many years but they are becoming increasingly bothersome.  Worst in shoe gears but starting to pain in the joint as well as the midfoot on the left side as well.  There is a large bone spur growing as well.   Interval history: Completed CT scan since last visit  Objective:  Physical Exam: warm, good capillary refill, no trophic changes or ulcerative lesions, normal DP and PT pulses, and normal sensory exam.  Bilateral she has severe hallux valgus deformity with the hallux abutting the second toe and rubbing a callus on the second hammertoe as well.  Palpable dorsal spurring at the MTPJ on the left foot and TMT J on the left foot second as well.  Radiographs: Multiple views x-ray of both feet: Moderate to severe hallux valgus with arthritic changes within the metatarsophalangeal joint bilateral, looks like previous Lisfranc fracture and osteoarthritis of second TMT J on the left foot  Study Result  Narrative & Impression  CLINICAL DATA:  Chronic foot pain   EXAM: CT OF THE LEFT FOOT WITHOUT CONTRAST   TECHNIQUE: Multidetector CT imaging of the left foot was performed according to the standard protocol. Multiplanar CT image reconstructions were also generated.   COMPARISON:  Radiographs 09/22/2021   FINDINGS: Bones/Joint/Cartilage   Corticated fragments along the volar-medial margin of the base of the second metatarsal noted, potentially representing part of the attachment of the Lisfranc ligament. There is expected alignment at the Lisfranc joint. Substantial osteoarthritis with prominent articular space narrowing and  confluent degenerative subcortical cyst formation as well as marginal osteophytes between the middle cuneiform and the base of the second metatarsal. Moderate to prominent osteoarthritis between the lateral cuneiform and the base of the third metatarsal. Mild to moderate osteoarthritis with loss of articular space between the cuboid and fourth and fifth metatarsal bases, with degenerative subcortical cyst formation in the distal cuboid adjacent to the fourth metatarsal articulation. There is some mild loss of articular space at the articulation between the medial cuneiform and the base of the first metatarsal. Given the lack of malalignment, I am skeptical of overt Lisfranc joint instability.   Moderate degenerative arthropathy at the first MTP joint with more striking osteoarthritis between the first metatarsal head in the sesamoids, with associated spurring and loss of articular cartilage. Mild hallux valgus with bony bunion. Medial cortical irregularity along the head of the first metatarsal, cannot exclude a small tophus in the setting of gout arthropathy.   Degenerative subcortical cyst proximally in the medial cuneiform with mild degenerative midfoot chondral thinning.   Plantar calcaneal spur.   Loss of height of the normal longitudinal arch of the foot raising concern for pes planus.   Ligaments   Suboptimally assessed by CT. Suspected thickening of the superomedial portion of the spring ligament for example on image 39 series 4.   Muscles and Tendons   Expanded and indistinct distal tibialis posterior tendon, possible tibialis posterior tendinopathy. Mild fusiform prominence of the distal Achilles tendon suggesting Achilles tendinopathy.   Soft tissues   Nonspecific edema in the subcutaneous tissues plantar to the fifth MTP joint.  IMPRESSION: 1. Varying degrees of osteoarthritis at the Lisfranc joint, most severe between the middle cuneiform and the base of  the second metatarsal. Small chronic well corticated bony fragments along the dorsal-medial margin of the base of the second metatarsal may partially correspond to attachment sites of the Lisfranc ligament but most of the Lisfranc ligament attachment is along the main shaft, and there is no malalignment at the Lisfranc joint to suggest Lisfranc joint instability. 2. Moderate degenerative arthropathy of the first MTP joint and in particular between the head of the first metatarsal and the sesamoids. There is mild hallux valgus and bony bunion along with some mild cortical irregularity along the medial head of the first metatarsal, cannot exclude a small tophus in the setting of gout arthropathy. 3. Loss of height of the normal longitudinal arch of the foot concerning for pes planus. There is expansion of the distal tibialis posterior tendon suspicious for tendinopathy, as well as some thickening of the superomedial portion of the spring ligament. 4. Mild distal Achilles tendinopathy. 5. Nonspecific edema in the subcutaneous tissues plantar to the fifth MTP joint. 6. Mild degenerative midfoot arthropathy. 7. Plantar calcaneal spur.     Electronically Signed   By: Van Clines M.D.   On: 10/09/2021 11:32   Assessment:   1. Osteopenia determined by x-ray   2. Osteoarthritis of first metatarsophalangeal joint   3. Arthritis of midtarsal joint of left foot   4. Bilateral bunions      Plan:  Patient was evaluated and treated and all questions answered.  I reviewed the findings of the CT scan with her.  She has multiple issues including midfoot arthritis a large bunion and arthritis of the first MTPJ.  She return to see me in a few weeks for surgical planning visit for the end of January.  I think likely she will require for second and third tarsometatarsal joint arthrodesis with cheilectomy of the first MTPJ, if she continues to have arthritic pain in the joint later she may  require additional first MTPJ arthrodesis versus implant arthroplasty if not improving.  I discussed the rationale behind each of these procedures with her.  She is hesitant to proceed with a first MPJ arthrodesis.  We will discuss further at her next visit  Return in about 5 weeks (around 11/17/2021) for surgery planning visit for January 27.

## 2021-11-01 ENCOUNTER — Telehealth: Payer: Self-pay | Admitting: *Deleted

## 2021-11-01 NOTE — Telephone Encounter (Signed)
Patient is calling to request another lab paperwork, misplaced one given.

## 2021-11-10 DIAGNOSIS — M8580 Other specified disorders of bone density and structure, unspecified site: Secondary | ICD-10-CM | POA: Diagnosis not present

## 2021-11-11 LAB — CALCIUM: Calcium: 8.8 mg/dL (ref 8.7–10.3)

## 2021-11-11 LAB — VITAMIN D 25 HYDROXY (VIT D DEFICIENCY, FRACTURES): Vit D, 25-Hydroxy: 17.3 ng/mL — ABNORMAL LOW (ref 30.0–100.0)

## 2021-11-21 ENCOUNTER — Other Ambulatory Visit: Payer: Self-pay

## 2021-11-21 ENCOUNTER — Ambulatory Visit: Payer: Medicare HMO | Admitting: Podiatry

## 2021-11-21 DIAGNOSIS — M19079 Primary osteoarthritis, unspecified ankle and foot: Secondary | ICD-10-CM

## 2021-11-21 DIAGNOSIS — M21612 Bunion of left foot: Secondary | ICD-10-CM | POA: Diagnosis not present

## 2021-11-21 DIAGNOSIS — M19072 Primary osteoarthritis, left ankle and foot: Secondary | ICD-10-CM

## 2021-11-21 DIAGNOSIS — E559 Vitamin D deficiency, unspecified: Secondary | ICD-10-CM

## 2021-11-21 DIAGNOSIS — M21611 Bunion of right foot: Secondary | ICD-10-CM | POA: Diagnosis not present

## 2021-11-21 MED ORDER — VITAMIN D (ERGOCALCIFEROL) 1.25 MG (50000 UNIT) PO CAPS: 50000.0000 [IU] | ORAL_CAPSULE | ORAL | 0 refills | Status: DC

## 2021-11-23 NOTE — Progress Notes (Signed)
Subjective:  Patient ID: Mary Dalton, female    DOB: 11/13/1956,  MRN: [id number]  Chief Complaint  Patient presents with   Foot Pain    6 week follow up left foot    65 y.o. female presents with the above complaint. History confirmed with patient.  Returns with a new complaint of increasing pain in her bunion deformity.  She is obviously had these for many years but they are becoming increasingly bothersome.  Worst in shoe gears but starting to pain in the joint as well as the midfoot on the left side as well.  There is a large bone spur growing as well.   Interval history: Completed her lab work since last visit  Objective:  Physical Exam: warm, good capillary refill, no trophic changes or ulcerative lesions, normal DP and PT pulses, and normal sensory exam.  Bilateral she has severe hallux valgus deformity with the hallux abutting the second toe and rubbing a callus on the second hammertoe as well.  Palpable dorsal spurring at the MTPJ on the left foot and TMT J on the left foot second as well.  Radiographs: Multiple views x-ray of both feet: Moderate to severe hallux valgus with arthritic changes within the metatarsophalangeal joint bilateral, looks like previous Lisfranc fracture and osteoarthritis of second TMT J on the left foot  Study Result  Narrative & Impression  CLINICAL DATA:  Chronic foot pain   EXAM: CT OF THE LEFT FOOT WITHOUT CONTRAST   TECHNIQUE: Multidetector CT imaging of the left foot was performed according to the standard protocol. Multiplanar CT image reconstructions were also generated.   COMPARISON:  Radiographs 09/22/2021   FINDINGS: Bones/Joint/Cartilage   Corticated fragments along the volar-medial margin of the base of the second metatarsal noted, potentially representing part of the attachment of the Lisfranc ligament. There is expected alignment at the Lisfranc joint. Substantial osteoarthritis with prominent articular space narrowing  and confluent degenerative subcortical cyst formation as well as marginal osteophytes between the middle cuneiform and the base of the second metatarsal. Moderate to prominent osteoarthritis between the lateral cuneiform and the base of the third metatarsal. Mild to moderate osteoarthritis with loss of articular space between the cuboid and fourth and fifth metatarsal bases, with degenerative subcortical cyst formation in the distal cuboid adjacent to the fourth metatarsal articulation. There is some mild loss of articular space at the articulation between the medial cuneiform and the base of the first metatarsal. Given the lack of malalignment, I am skeptical of overt Lisfranc joint instability.   Moderate degenerative arthropathy at the first MTP joint with more striking osteoarthritis between the first metatarsal head in the sesamoids, with associated spurring and loss of articular cartilage. Mild hallux valgus with bony bunion. Medial cortical irregularity along the head of the first metatarsal, cannot exclude a small tophus in the setting of gout arthropathy.   Degenerative subcortical cyst proximally in the medial cuneiform with mild degenerative midfoot chondral thinning.   Plantar calcaneal spur.   Loss of height of the normal longitudinal arch of the foot raising concern for pes planus.   Ligaments   Suboptimally assessed by CT. Suspected thickening of the superomedial portion of the spring ligament for example on image 39 series 4.   Muscles and Tendons   Expanded and indistinct distal tibialis posterior tendon, possible tibialis posterior tendinopathy. Mild fusiform prominence of the distal Achilles tendon suggesting Achilles tendinopathy.   Soft tissues   Nonspecific edema in the subcutaneous tissues plantar to the  fifth MTP joint.   IMPRESSION: 1. Varying degrees of osteoarthritis at the Lisfranc joint, most severe between the middle cuneiform and the base  of the second metatarsal. Small chronic well corticated bony fragments along the dorsal-medial margin of the base of the second metatarsal may partially correspond to attachment sites of the Lisfranc ligament but most of the Lisfranc ligament attachment is along the main shaft, and there is no malalignment at the Lisfranc joint to suggest Lisfranc joint instability. 2. Moderate degenerative arthropathy of the first MTP joint and in particular between the head of the first metatarsal and the sesamoids. There is mild hallux valgus and bony bunion along with some mild cortical irregularity along the medial head of the first metatarsal, cannot exclude a small tophus in the setting of gout arthropathy. 3. Loss of height of the normal longitudinal arch of the foot concerning for pes planus. There is expansion of the distal tibialis posterior tendon suspicious for tendinopathy, as well as some thickening of the superomedial portion of the spring ligament. 4. Mild distal Achilles tendinopathy. 5. Nonspecific edema in the subcutaneous tissues plantar to the fifth MTP joint. 6. Mild degenerative midfoot arthropathy. 7. Plantar calcaneal spur.     Electronically Signed   By: Van Clines M.D.   On: 10/09/2021 11:32    Vitamin D 25 hydroxy 11/10/2021: Vit D, 25-Hydroxy 30.0 - 100.0 ng/mL 17.3 Low     Assessment:   1. Hypovitaminosis D   2. Osteoarthritis of first metatarsophalangeal joint   3. Arthritis of midtarsal joint of left foot   4. Bilateral bunions      Plan:  Patient was evaluated and treated and all questions answered.  We again discussed surgical correction of her foot deformities.  I reviewed her lab work with her that her vitamin D level is quite low.  I recommend supplementation with 50,000 units weekly for 8 weeks.  I gave her a new order to have this rechecked.  Would like this to be higher prior to proceeding with surgery.  I have also sent her a referral to  endocrinology for this to evaluate for other sources of hypovitaminosis D   Return in about 8 weeks (around 01/16/2022) for after lab work to review.

## 2022-01-16 ENCOUNTER — Other Ambulatory Visit: Payer: Self-pay

## 2022-01-16 ENCOUNTER — Ambulatory Visit (INDEPENDENT_AMBULATORY_CARE_PROVIDER_SITE_OTHER): Payer: Medicare HMO | Admitting: Podiatry

## 2022-01-16 DIAGNOSIS — M19072 Primary osteoarthritis, left ankle and foot: Secondary | ICD-10-CM | POA: Diagnosis not present

## 2022-01-16 DIAGNOSIS — M19079 Primary osteoarthritis, unspecified ankle and foot: Secondary | ICD-10-CM

## 2022-01-16 DIAGNOSIS — E559 Vitamin D deficiency, unspecified: Secondary | ICD-10-CM | POA: Diagnosis not present

## 2022-01-17 LAB — VITAMIN D 25 HYDROXY (VIT D DEFICIENCY, FRACTURES): Vit D, 25-Hydroxy: 31 ng/mL (ref 30.0–100.0)

## 2022-01-22 ENCOUNTER — Encounter: Payer: Self-pay | Admitting: Podiatry

## 2022-01-22 MED ORDER — VITAMIN D (ERGOCALCIFEROL) 1.25 MG (50000 UNIT) PO CAPS: 50000.0000 [IU] | ORAL_CAPSULE | ORAL | 0 refills | Status: DC

## 2022-01-22 NOTE — Progress Notes (Signed)
Subjective:  Patient ID: Mary Dalton, female    DOB: Aug 26, 1956,  MRN: [id number]  Chief Complaint  Patient presents with   Tendonitis        8 week  left foot follow up    66 y.o. female presents with the above complaint. History confirmed with patient.  Returns with a new complaint of increasing pain in her bunion deformity.  She is obviously had these for many years but they are becoming increasingly bothersome.  Worst in shoe gears but starting to pain in the joint as well as the midfoot on the left side as well.  There is a large bone spur growing as well.   Interval history: She is doing well.  She would like to plan for surgery later in the spring.  She did not get the new lab value checked yet.  Objective:  Physical Exam: warm, good capillary refill, no trophic changes or ulcerative lesions, normal DP and PT pulses, and normal sensory exam.  Bilateral she has severe hallux valgus deformity with the hallux abutting the second toe and rubbing a callus on the second hammertoe as well.  Palpable dorsal spurring at the MTPJ on the left foot and TMT J on the left foot second as well.  Radiographs: Multiple views x-ray of both feet: Moderate to severe hallux valgus with arthritic changes within the metatarsophalangeal joint bilateral, looks like previous Lisfranc fracture and osteoarthritis of second TMT J on the left foot  Study Result  Narrative & Impression  CLINICAL DATA:  Chronic foot pain   EXAM: CT OF THE LEFT FOOT WITHOUT CONTRAST   TECHNIQUE: Multidetector CT imaging of the left foot was performed according to the standard protocol. Multiplanar CT image reconstructions were also generated.   COMPARISON:  Radiographs 09/22/2021   FINDINGS: Bones/Joint/Cartilage   Corticated fragments along the volar-medial margin of the base of the second metatarsal noted, potentially representing part of the attachment of the Lisfranc ligament. There is expected alignment  at the Lisfranc joint. Substantial osteoarthritis with prominent articular space narrowing and confluent degenerative subcortical cyst formation as well as marginal osteophytes between the middle cuneiform and the base of the second metatarsal. Moderate to prominent osteoarthritis between the lateral cuneiform and the base of the third metatarsal. Mild to moderate osteoarthritis with loss of articular space between the cuboid and fourth and fifth metatarsal bases, with degenerative subcortical cyst formation in the distal cuboid adjacent to the fourth metatarsal articulation. There is some mild loss of articular space at the articulation between the medial cuneiform and the base of the first metatarsal. Given the lack of malalignment, I am skeptical of overt Lisfranc joint instability.   Moderate degenerative arthropathy at the first MTP joint with more striking osteoarthritis between the first metatarsal head in the sesamoids, with associated spurring and loss of articular cartilage. Mild hallux valgus with bony bunion. Medial cortical irregularity along the head of the first metatarsal, cannot exclude a small tophus in the setting of gout arthropathy.   Degenerative subcortical cyst proximally in the medial cuneiform with mild degenerative midfoot chondral thinning.   Plantar calcaneal spur.   Loss of height of the normal longitudinal arch of the foot raising concern for pes planus.   Ligaments   Suboptimally assessed by CT. Suspected thickening of the superomedial portion of the spring ligament for example on image 39 series 4.   Muscles and Tendons   Expanded and indistinct distal tibialis posterior tendon, possible tibialis posterior tendinopathy. Mild fusiform  prominence of the distal Achilles tendon suggesting Achilles tendinopathy.   Soft tissues   Nonspecific edema in the subcutaneous tissues plantar to the fifth MTP joint.   IMPRESSION: 1. Varying degrees of  osteoarthritis at the Lisfranc joint, most severe between the middle cuneiform and the base of the second metatarsal. Small chronic well corticated bony fragments along the dorsal-medial margin of the base of the second metatarsal may partially correspond to attachment sites of the Lisfranc ligament but most of the Lisfranc ligament attachment is along the main shaft, and there is no malalignment at the Lisfranc joint to suggest Lisfranc joint instability. 2. Moderate degenerative arthropathy of the first MTP joint and in particular between the head of the first metatarsal and the sesamoids. There is mild hallux valgus and bony bunion along with some mild cortical irregularity along the medial head of the first metatarsal, cannot exclude a small tophus in the setting of gout arthropathy. 3. Loss of height of the normal longitudinal arch of the foot concerning for pes planus. There is expansion of the distal tibialis posterior tendon suspicious for tendinopathy, as well as some thickening of the superomedial portion of the spring ligament. 4. Mild distal Achilles tendinopathy. 5. Nonspecific edema in the subcutaneous tissues plantar to the fifth MTP joint. 6. Mild degenerative midfoot arthropathy. 7. Plantar calcaneal spur.     Electronically Signed   By: Van Clines M.D.   On: 10/09/2021 11:32    Vitamin D 25 hydroxy 11/10/2021: Vit D, 25-Hydroxy 30.0 - 100.0 ng/mL 17.3 Low     Assessment:   1. Hypovitaminosis D   2. Osteoarthritis of first metatarsophalangeal joint   3. Arthritis of midtarsal joint of left foot       Plan:  Patient was evaluated and treated and all questions answered.  She is doing well she completed her first course of vitamin D supplementation.  I rechecked her level and it is at 54 and I recommended we supplementing again prior to surgery.  I will see her back in 1 month for a surgery planning visit.  Return in about 1 month (around 02/13/2022)  for surgery planning visit .

## 2022-02-13 ENCOUNTER — Emergency Department (HOSPITAL_COMMUNITY): Payer: Medicare HMO

## 2022-02-13 ENCOUNTER — Telehealth: Payer: Self-pay | Admitting: Podiatry

## 2022-02-13 ENCOUNTER — Other Ambulatory Visit: Payer: Self-pay

## 2022-02-13 ENCOUNTER — Emergency Department (HOSPITAL_COMMUNITY)
Admission: EM | Admit: 2022-02-13 | Discharge: 2022-02-14 | Disposition: A | Discharge status: Home / Self Care | Payer: Medicare HMO | Attending: Emergency Medicine | Admitting: Emergency Medicine

## 2022-02-13 ENCOUNTER — Ambulatory Visit: Payer: Medicare HMO | Admitting: Podiatry

## 2022-02-13 ENCOUNTER — Encounter (HOSPITAL_COMMUNITY): Payer: Self-pay | Admitting: Emergency Medicine

## 2022-02-13 DIAGNOSIS — R0602 Shortness of breath: Secondary | ICD-10-CM | POA: Insufficient documentation

## 2022-02-13 DIAGNOSIS — R519 Headache, unspecified: Secondary | ICD-10-CM | POA: Insufficient documentation

## 2022-02-13 DIAGNOSIS — E039 Hypothyroidism, unspecified: Secondary | ICD-10-CM | POA: Diagnosis not present

## 2022-02-13 DIAGNOSIS — Z79899 Other long term (current) drug therapy: Secondary | ICD-10-CM | POA: Insufficient documentation

## 2022-02-13 DIAGNOSIS — R29818 Other symptoms and signs involving the nervous system: Secondary | ICD-10-CM | POA: Diagnosis not present

## 2022-02-13 DIAGNOSIS — I2510 Atherosclerotic heart disease of native coronary artery without angina pectoris: Secondary | ICD-10-CM | POA: Diagnosis not present

## 2022-02-13 DIAGNOSIS — R42 Dizziness and giddiness: Secondary | ICD-10-CM | POA: Insufficient documentation

## 2022-02-13 DIAGNOSIS — M542 Cervicalgia: Secondary | ICD-10-CM | POA: Insufficient documentation

## 2022-02-13 DIAGNOSIS — I10 Essential (primary) hypertension: Secondary | ICD-10-CM | POA: Diagnosis not present

## 2022-02-13 DIAGNOSIS — H538 Other visual disturbances: Secondary | ICD-10-CM | POA: Diagnosis not present

## 2022-02-13 DIAGNOSIS — Z20822 Contact with and (suspected) exposure to covid-19: Secondary | ICD-10-CM | POA: Diagnosis not present

## 2022-02-13 DIAGNOSIS — R930 Abnormal findings on diagnostic imaging of skull and head, not elsewhere classified: Secondary | ICD-10-CM | POA: Insufficient documentation

## 2022-02-13 LAB — COMPREHENSIVE METABOLIC PANEL
ALT: 18 U/L (ref 0–44)
AST: 21 U/L (ref 15–41)
Albumin: 3.5 g/dL (ref 3.5–5.0)
Alkaline Phosphatase: 91 U/L (ref 38–126)
Anion gap: 10 (ref 5–15)
BUN: 13 mg/dL (ref 8–23)
CO2: 25 mmol/L (ref 22–32)
Calcium: 9.1 mg/dL (ref 8.9–10.3)
Chloride: 106 mmol/L (ref 98–111)
Creatinine, Ser: 0.74 mg/dL (ref 0.44–1.00)
GFR, Estimated: >60 mL/min (ref >60)
Glucose, Bld: 102 mg/dL — ABNORMAL HIGH (ref 70–99)
Potassium: 4.1 mmol/L (ref 3.5–5.1)
Sodium: 141 mmol/L (ref 135–145)
Total Bilirubin: 0.4 mg/dL (ref 0.3–1.2)
Total Protein: 7.3 g/dL (ref 6.5–8.1)

## 2022-02-13 LAB — URINALYSIS, ROUTINE W REFLEX MICROSCOPIC
Bilirubin Urine: NEGATIVE
Glucose, UA: NEGATIVE mg/dL
Ketones, ur: 5 mg/dL — AB
Leukocytes,Ua: NEGATIVE
Nitrite: NEGATIVE
Protein, ur: NEGATIVE mg/dL
Specific Gravity, Urine: 1.016 (ref 1.005–1.030)
pH: 6 (ref 5.0–8.0)

## 2022-02-13 LAB — CBC WITH DIFFERENTIAL/PLATELET
Abs Immature Granulocytes: 0.02 10*3/uL (ref 0.00–0.07)
Basophils Absolute: 0.1 10*3/uL (ref 0.0–0.1)
Basophils Relative: 1 %
Eosinophils Absolute: 0.3 10*3/uL (ref 0.0–0.5)
Eosinophils Relative: 4 %
HCT: 39.2 % (ref 36.0–46.0)
Hemoglobin: 13 g/dL (ref 12.0–15.0)
Immature Granulocytes: 0 %
Lymphocytes Relative: 27 %
Lymphs Abs: 1.8 10*3/uL (ref 0.7–4.0)
MCH: 31.5 pg (ref 26.0–34.0)
MCHC: 33.2 g/dL (ref 30.0–36.0)
MCV: 94.9 fL (ref 80.0–100.0)
Monocytes Absolute: 0.5 10*3/uL (ref 0.1–1.0)
Monocytes Relative: 7 %
Neutro Abs: 4.1 10*3/uL (ref 1.7–7.7)
Neutrophils Relative %: 61 %
Platelets: 321 10*3/uL (ref 150–400)
RBC: 4.13 MIL/uL (ref 3.87–5.11)
RDW: 13.3 % (ref 11.5–15.5)
WBC: 6.7 10*3/uL (ref 4.0–10.5)
nRBC: 0 % (ref 0.0–0.2)

## 2022-02-13 LAB — I-STAT CHEM 8, ED
BUN: 14 mg/dL (ref 8–23)
Calcium, Ion: 1.12 mmol/L — ABNORMAL LOW (ref 1.15–1.40)
Chloride: 105 mmol/L (ref 98–111)
Creatinine, Ser: 0.7 mg/dL (ref 0.44–1.00)
Glucose, Bld: 101 mg/dL — ABNORMAL HIGH (ref 70–99)
HCT: 39 % (ref 36.0–46.0)
Hemoglobin: 13.3 g/dL (ref 12.0–15.0)
Potassium: 4.1 mmol/L (ref 3.5–5.1)
Sodium: 141 mmol/L (ref 135–145)
TCO2: 27 mmol/L (ref 22–32)

## 2022-02-13 LAB — RESP PANEL BY RT-PCR (FLU A&B, COVID) ARPGX2
Influenza A by PCR: NEGATIVE
Influenza B by PCR: NEGATIVE
SARS Coronavirus 2 by RT PCR: NEGATIVE

## 2022-02-13 LAB — TROPONIN I (HIGH SENSITIVITY)
Troponin I (High Sensitivity): 8 ng/L (ref <18)
Troponin I (High Sensitivity): 9 ng/L (ref <18)

## 2022-02-13 LAB — RAPID URINE DRUG SCREEN, HOSP PERFORMED
Amphetamines: NOT DETECTED
Barbiturates: NOT DETECTED
Benzodiazepines: NOT DETECTED
Cocaine: NOT DETECTED
Opiates: NOT DETECTED
Tetrahydrocannabinol: NOT DETECTED

## 2022-02-13 LAB — APTT: aPTT: 29 seconds (ref 24–36)

## 2022-02-13 LAB — PROTIME-INR
INR: 1.1 (ref 0.8–1.2)
Prothrombin Time: 13.7 seconds (ref 11.4–15.2)

## 2022-02-13 IMAGING — MR MR MRA NECK W/O CM
1 of 2 series · 29 of 48 positions shown · non-contrast
Comparison: No prior MRI, correlation is made with CT head
[DATE]

CLINICAL DATA: Stroke suspected, neuro deficit

EXAM:
MRI HEAD WITHOUT CONTRAST
MRA HEAD WITHOUT CONTRAST
MRA NECK WITHOUT CONTRAST
TECHNIQUE: Multiplanar, multi-echo pulse sequences of the brain and surrounding
structures were acquired without intravenous contrast. Angiographic
images of the Circle of Willis were acquired using MRA technique
without intravenous contrast. Angiographic images of the neck were
acquired using MRA technique without intravenous contrast. Carotid
stenosis measurements (when applicable) are obtained utilizing
NASCET criteria, using the distal internal carotid diameter as the
denominator.

[Series 18: [id number] · axial · 0.6mm · 0.52mm/px · z∈[-242,-80]mm · 29 of 292 slices shown]
[im 1/292]
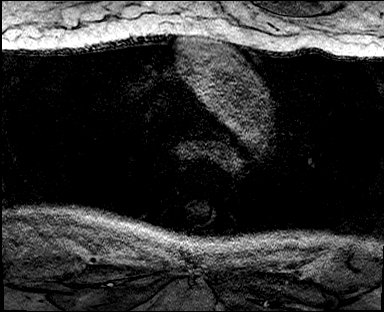
[im 11/292]
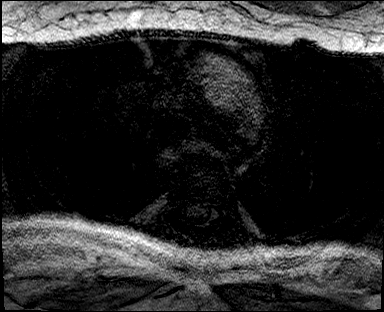
[im 21/292]
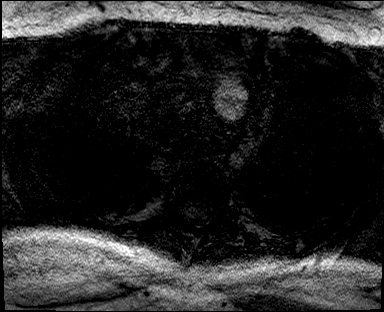
[im 32/292]
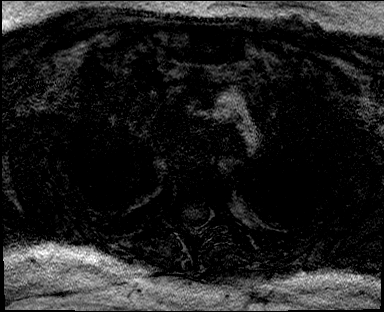
[im 42/292]
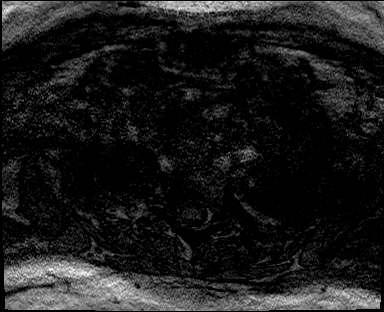
[im 52/292]
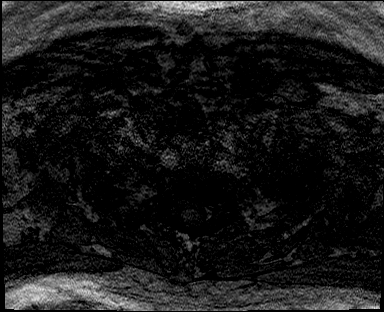
[im 63/292]
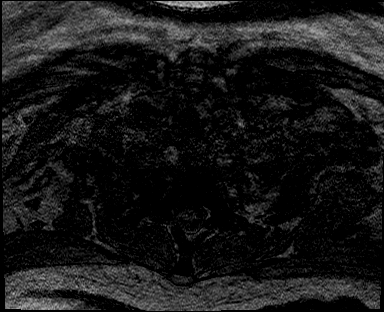
[im 73/292]
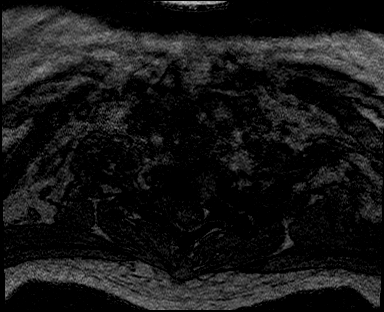
[im 84/292]
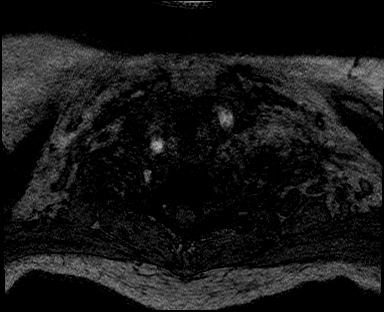
[im 94/292]
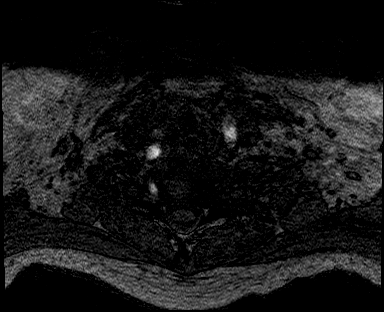
[im 104/292]
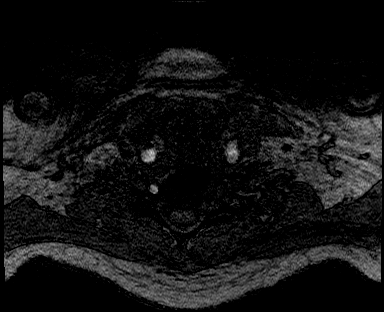
[im 115/292]
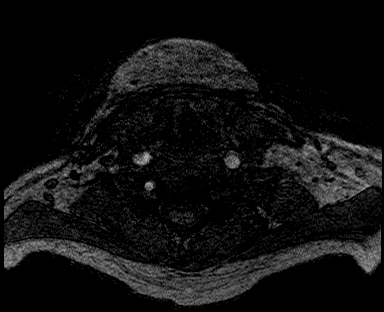
[im 125/292]
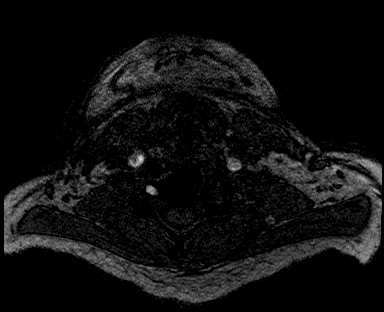
[im 136/292]
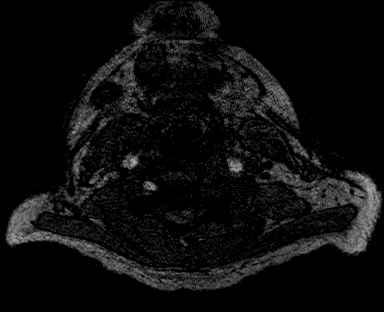
[im 146/292]
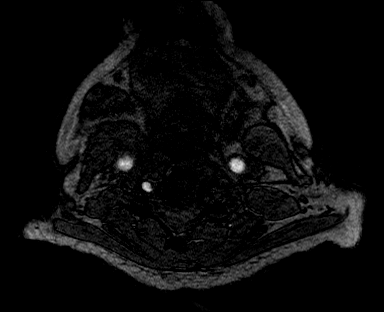
[im 156/292]
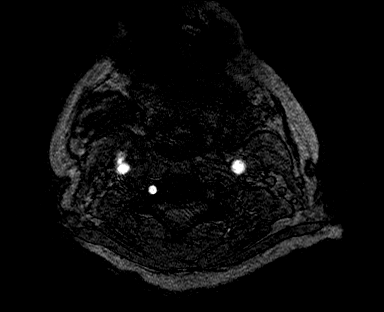
[im 167/292]
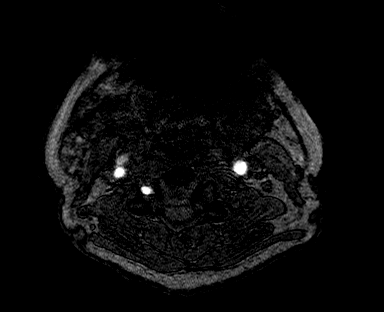
[im 177/292]
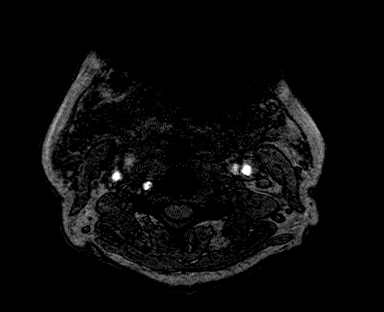
[im 188/292]
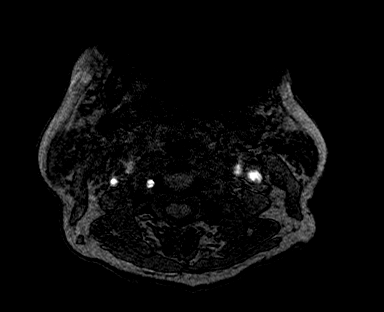
[im 198/292]
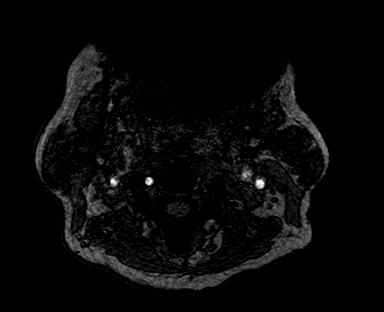
[im 208/292]
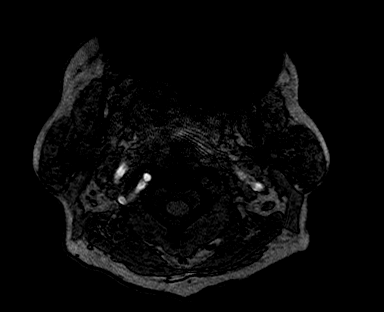
[im 219/292]
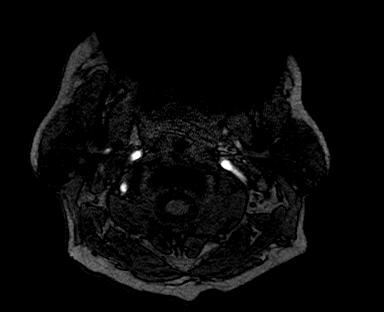
[im 229/292]
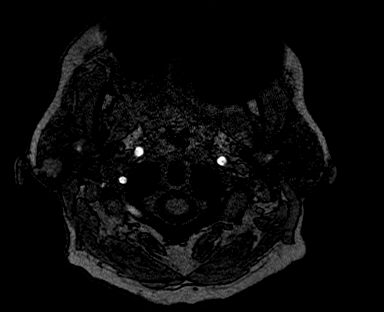
[im 240/292]
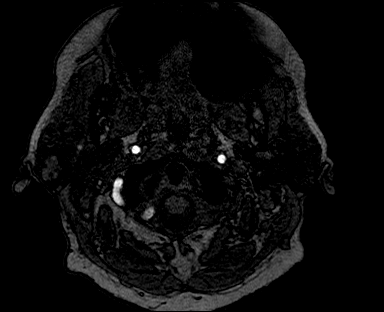
[im 250/292]
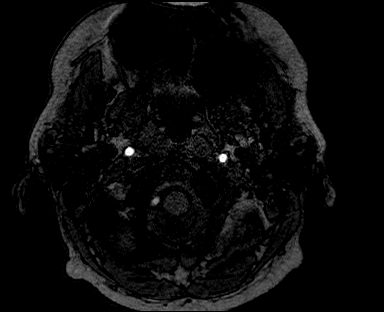
[im 260/292]
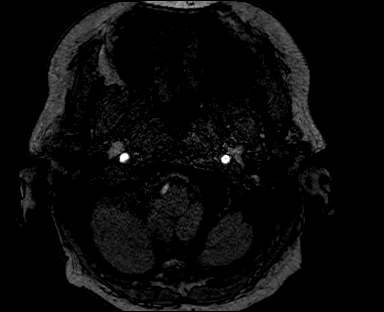
[im 271/292]
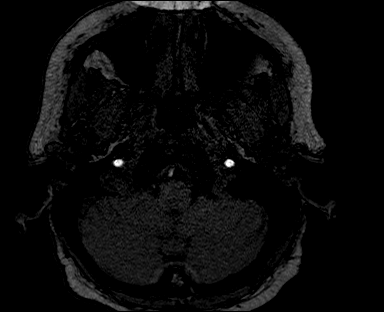
[im 281/292]
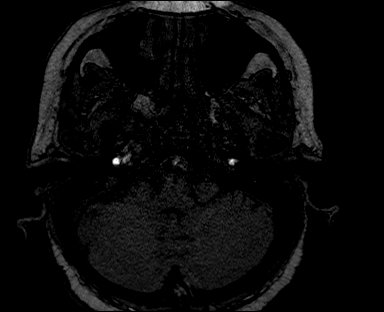
[im 292/292]
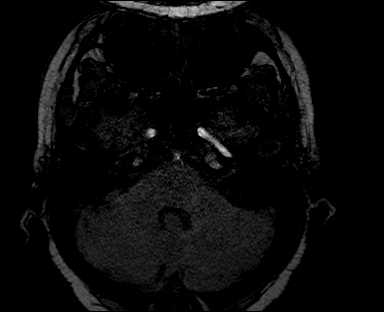

[29 of 48 positions shown; findings below may reference images not displayed]

FINDINGS: MRI HEAD FINDINGS

Brain: No restricted diffusion to suggest acute or subacute infarct.
No acute hemorrhage, mass, mass effect, or midline shift. No
hydrocephalus or extra-axial collection. Mild T2 hyperintense signal
in the periventricular white matter, likely the sequela of chronic
small vessel ischemic disease.

Vascular: Please see MRA findings below.

Skull and upper cervical spine: Normal marrow signal. Degenerative
changes in the cervical spine.

Sinuses/Orbits: Mucous retention cysts in the right maxillary sinus.
The orbits are unremarkable.

Other: Trace fluid in left mastoid air cells.  Asymmetry

MRA HEAD FINDINGS

Anterior circulation: Both internal carotid arteries are patent to
the termini, without significant stenosis. A1 segments patent.
Normal anterior communicating artery. Anterior cerebral arteries are
patent to their distal aspects. No M1 stenosis or occlusion. Normal
MCA bifurcations. Distal MCA branches perfused and symmetric.

Posterior circulation: The left V4 segment is poorly visualized
proximally but appears to be patent distally. The right V4 is patent
to the vertebrobasilar junction without stenosis. Posterior inferior
cerebral arteries patent bilaterally. Basilar patent to its distal
aspect. Superior cerebellar arteries patent bilaterally. Patent P1
segments. PCAs perfused to their distal aspects without stenosis.
The left posterior communicating artery is visualized. The right
posterior communicating artery is not visualized.

Anatomic variants: None significant

MRA NECK FINDINGS

Aortic arch: Poorly imaged due to artifact. Within this limitation,
no evidence of dissection or aneurysm.

Right carotid system: Poor visualization of the right common carotid
artery. The right ICA is patent, without significant stenosis. No
dissection or aneurysm.

Left carotid system: The left common carotid artery is poorly
visualized. The left ICA is patent, without significant stenosis. No
dissection or aneurysm.

Vertebral arteries: The left vertebral artery is not visualized
throughout its entire course. The right vertebral artery appears to
be patent from its origin to the skull base, although the origin is
poorly visualized.

Other: None
IMPRESSION: 1. No acute intracranial process. No evidence of acute or subacute
infarct.
2. Nonvisualization of the left V1, V2, and V3 segments; some signal
is seen in the distal V4 segment, possibly retrograde flow. This
suggests occlusion, but is of indeterminate acuity. Consider CTA for
further evaluation.
3. No other hemodynamically significant stenosis in the neck.
4. No other intracranial stenosis or large vessel occlusion.

## 2022-02-13 IMAGING — CT CT HEAD CODE STROKE
4 series · 17 of 47 positions shown, 19 images · non-contrast
Comparison: None.

CLINICAL DATA: Code stroke.  Neuro deficit, acute, stroke suspected



[Series 2: head wo · axial · 0.43mm/px · z∈[+1100,+1214]mm · 7 of 31 slices shown, 9 images]
[im 4/31  brain]
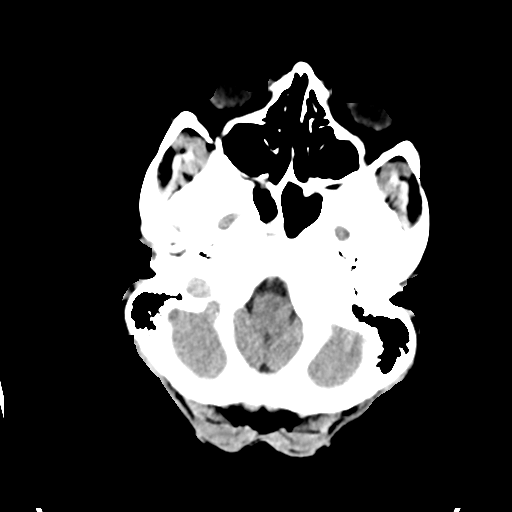
[im 4/31  bone]
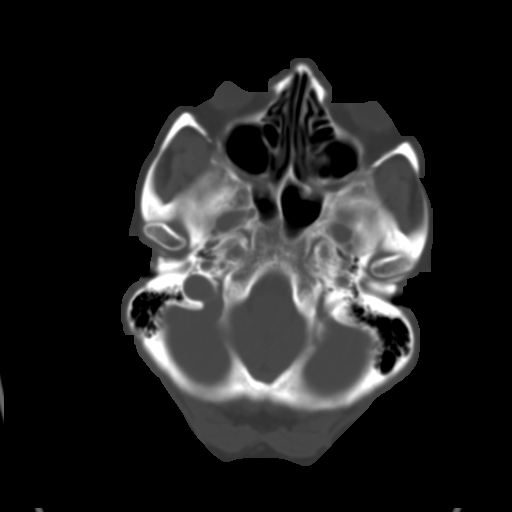
[im 8/31  brain]
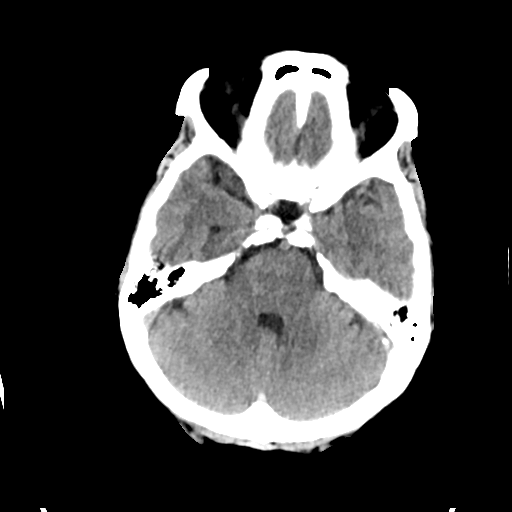
[im 12/31  brain]
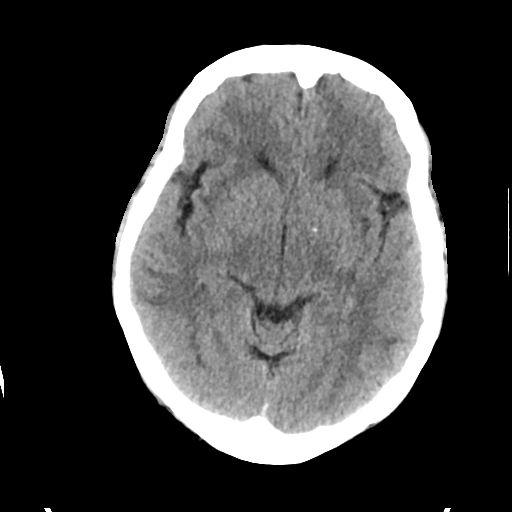
[im 16/31  brain]
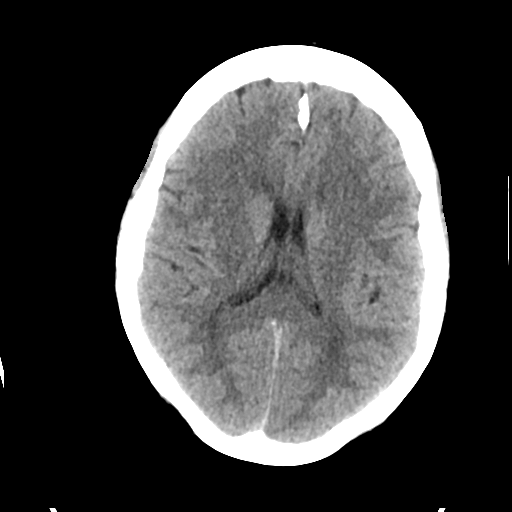
[im 19/31  brain]
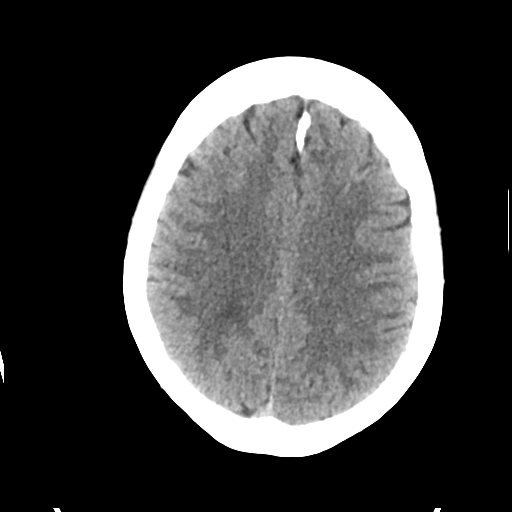
[im 19/31  bone]
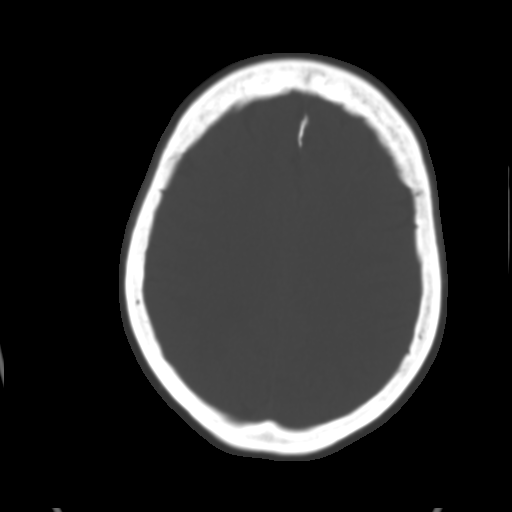
[im 23/31  brain]
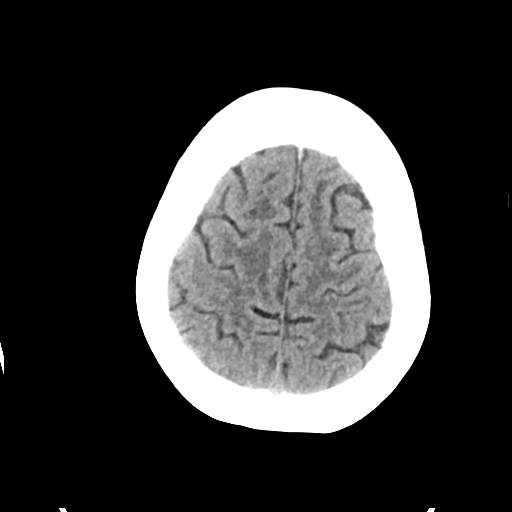
[im 27/31  brain]
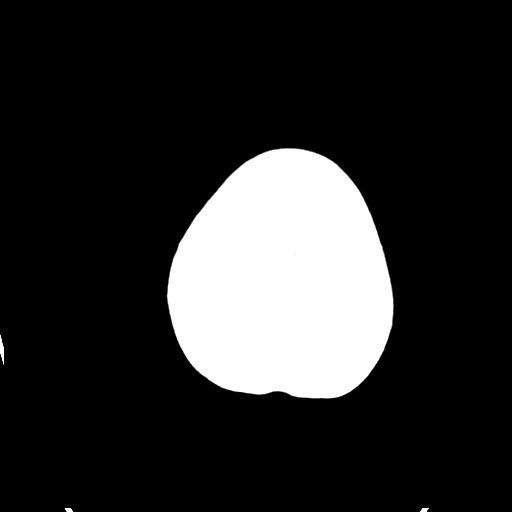

[Series 4: head bone · axial · 0.43mm/px · z∈[+1098,+1152]mm · 4 of 77 slices shown]
[im 8/77  bone]
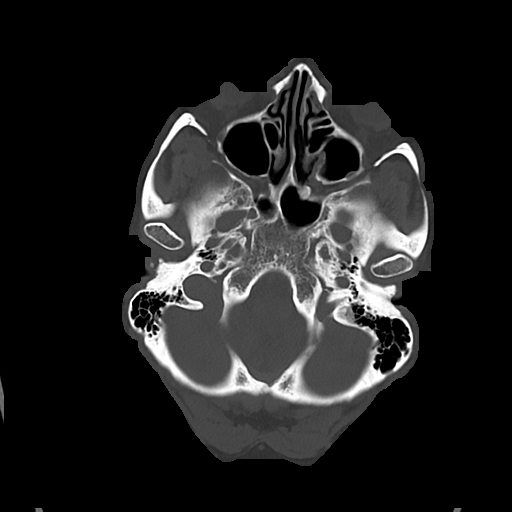
[im 16/77  bone]
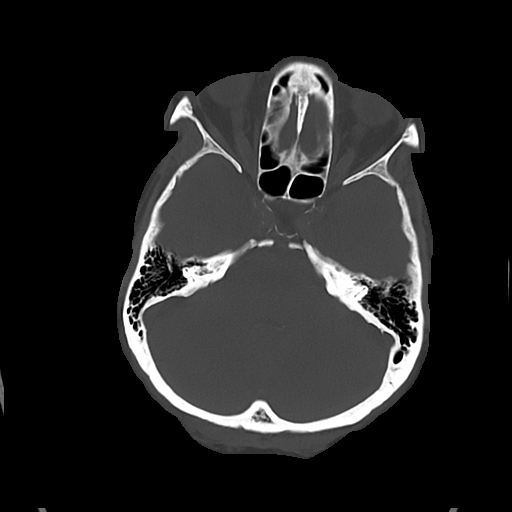
[im 23/77  bone]
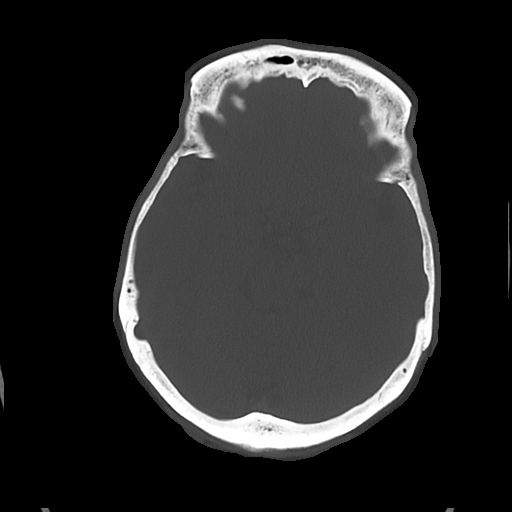
[im 35/77  bone]
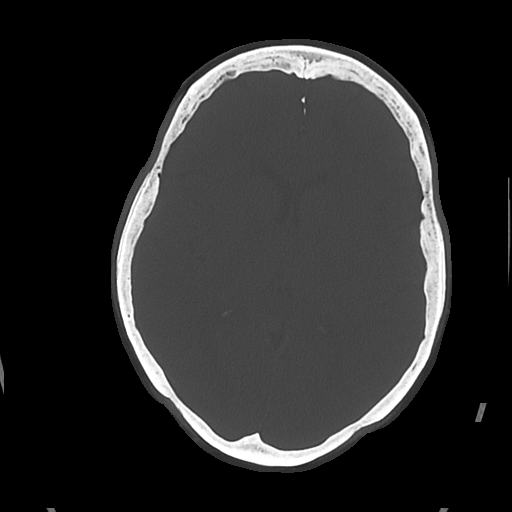

[Series 5: cor soft · coronal · 0.33mm/px · 3 of 66 slices shown]
[im 22/66  brain]
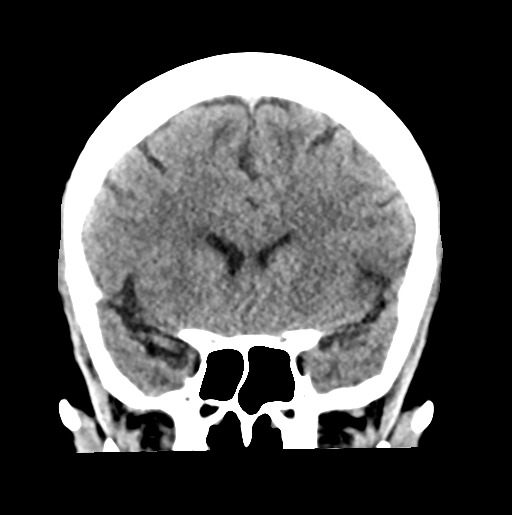
[im 29/66  brain]
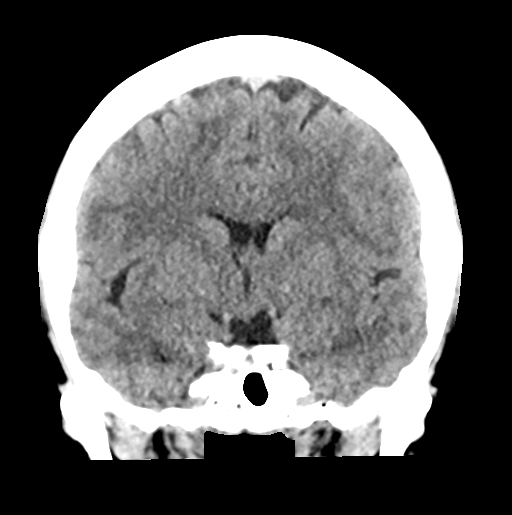
[im 37/66  brain]
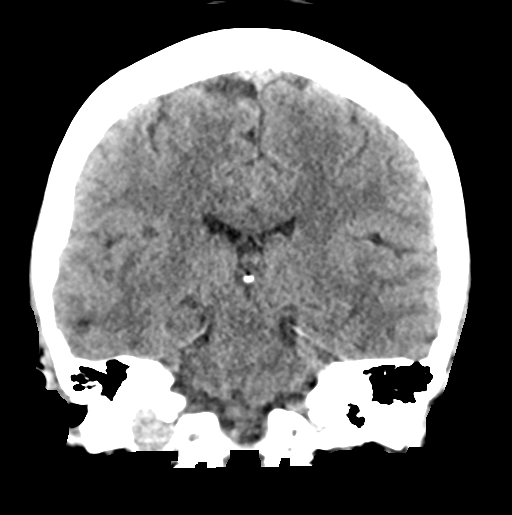

[Series 6: sag soft · sagittal · 0.33mm/px · 3 of 57 slices shown]
[im 19/57  brain]
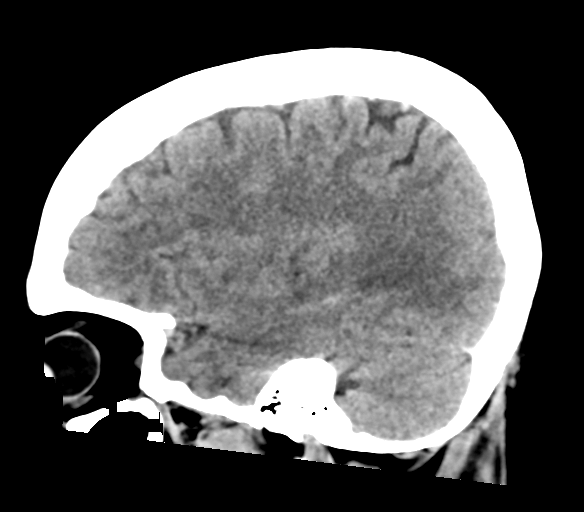
[im 29/57  brain]
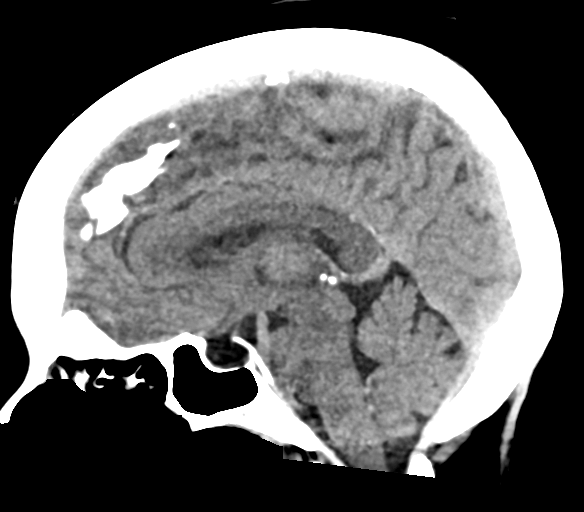
[im 38/57  brain]
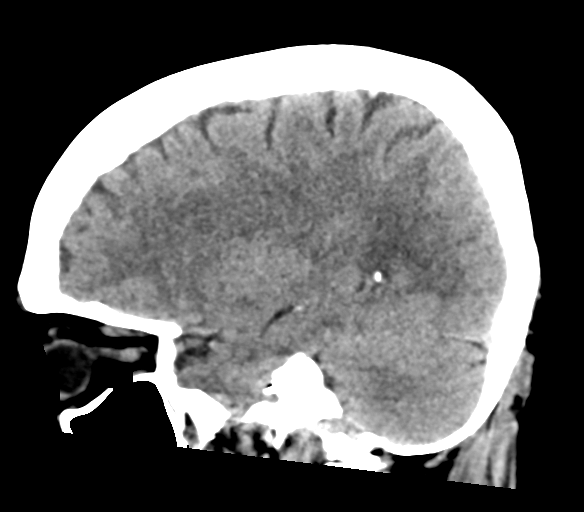

[17 of 47 positions shown; findings below may reference images not displayed]

FINDINGS: Brain: No evidence of acute large vascular territory infarction,
hemorrhage, hydrocephalus, extra-axial collection or mass
lesion/mass effect. Patchy white matter hypoattenuation, nonspecific
but compatible with chronic microvascular ischemic disease.

Vascular: No hyperdense vessel identified. Calcific intracranial
atherosclerosis.

Skull: No acute fracture.

Sinuses/Orbits: Largely clear sinuses.  No acute orbital findings.

Other: No mastoid effusions.

ASPECTS ([place] Stroke Program Early CT Score) total score (0-10
with 10 being normal): 10.
IMPRESSION: 1. No evidence of acute intracranial abnormality. ASPECTS is 10.
2. Presumed chronic microvascular ischemic disease. MRI could
further evaluate if clinically indicated.
3. Partially empty sella, which is often a normal anatomic variant
but can be associated with idiopathic intracranial hypertension.

Code stroke imaging results were communicated on [DATE] at [DATE] to provider Dr. KRISTINAWATI via secure text paging.

## 2022-02-13 IMAGING — CT CT ANGIO HEAD-NECK (W OR W/O PERF)
2 of 7 series · 8 of 33 positions shown · IV contrast (OMNI 350)
Comparison: [DATE] CT head, correlation is also made with MRA
head and neck [DATE]

CLINICAL DATA: Stroke suspected, dizziness

EXAM:
CT ANGIOGRAPHY HEAD AND NECK
TECHNIQUE: Multidetector CT imaging of the head and neck was performed using
the standard protocol during bolus administration of intravenous
contrast. Multiplanar CT image reconstructions and MIPs were
obtained to evaluate the vascular anatomy. Carotid stenosis
measurements (when applicable) are obtained utilizing NASCET
criteria, using the distal internal carotid diameter as the
denominator.

[Series 6: cta neck · axial · 0.46mm/px · z∈[-244,-130]mm · 2 of 171 slices shown]
[im 57/171  soft-tissue]
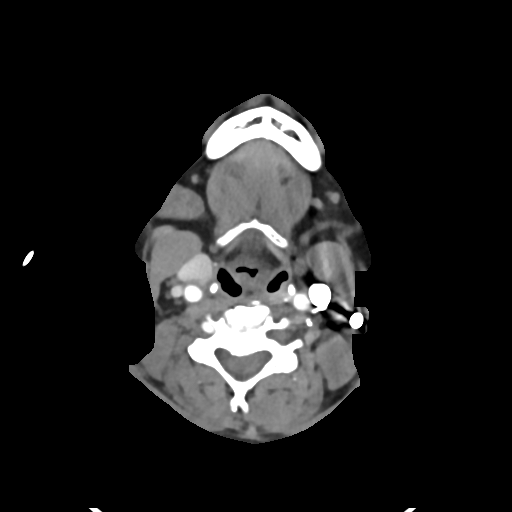
[im 114/171  soft-tissue]
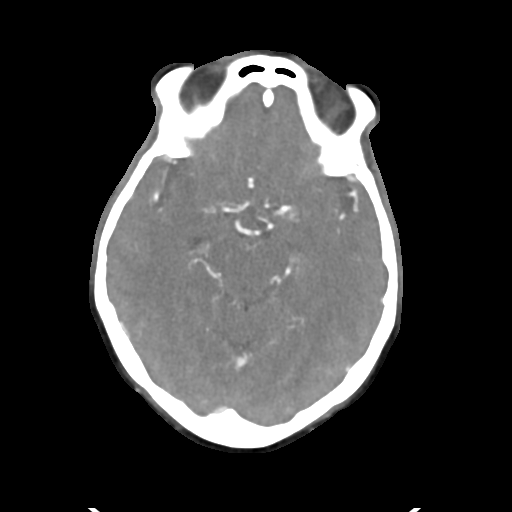

[Series 8: cta neck axial · axial · 0.39mm/px · z∈[-319,-78]mm · 6 of 340 slices shown]
[im 49/340  soft-tissue]
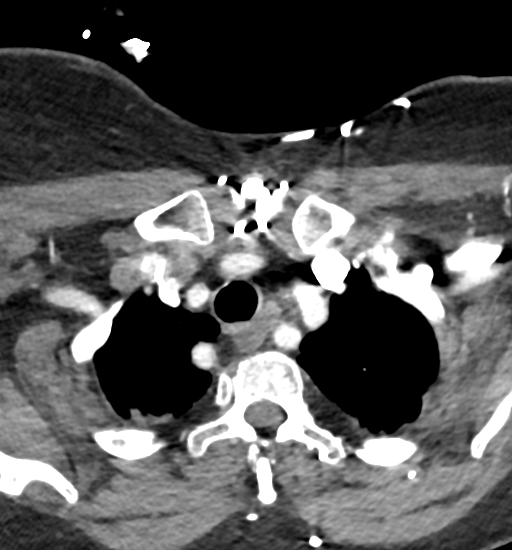
[im 97/340  bone]
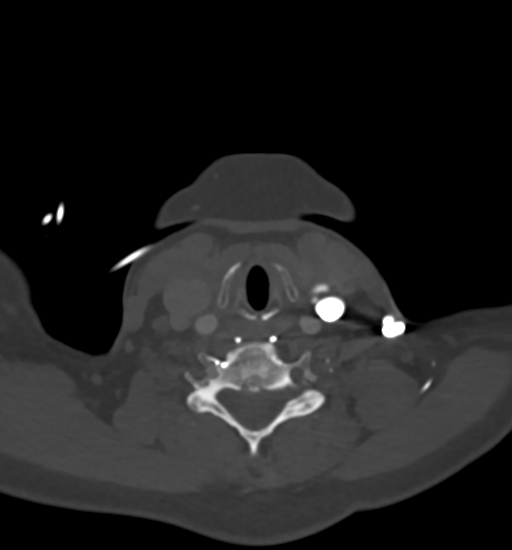
[im 146/340  soft-tissue]
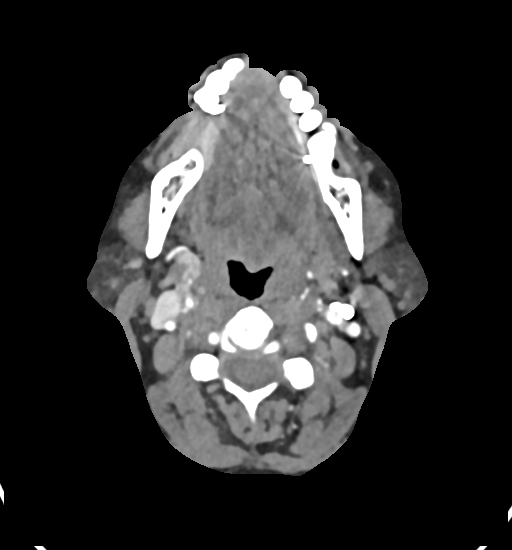
[im 194/340  bone]
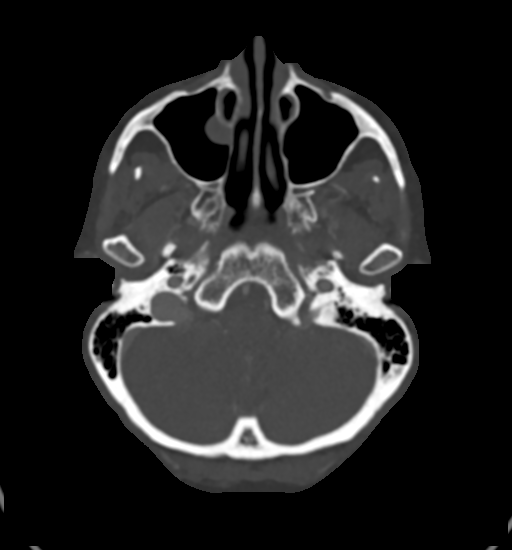
[im 243/340  soft-tissue]
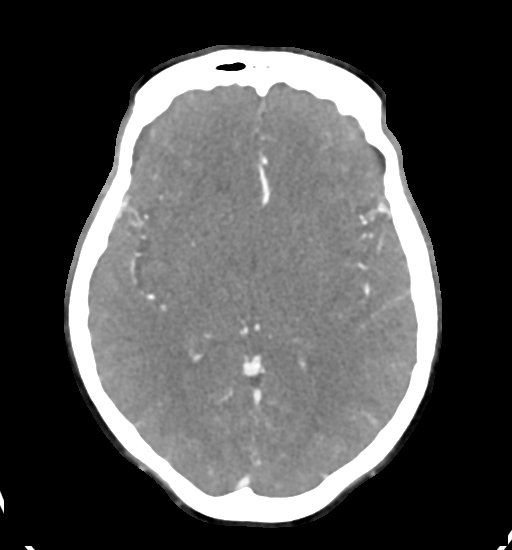
[im 291/340  bone]
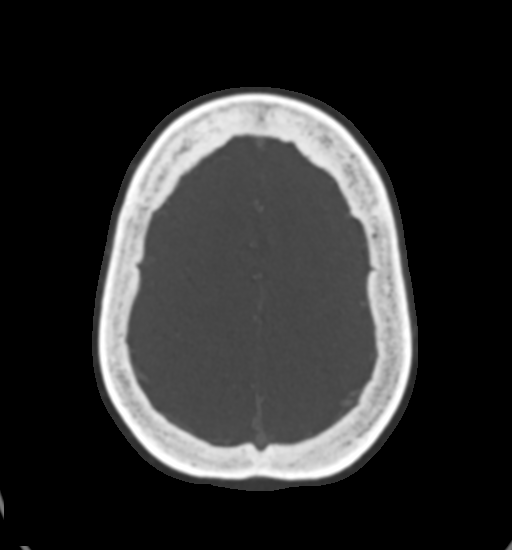

[8 of 33 positions shown; findings below may reference images not displayed]

RADIATION DOSE REDUCTION: This exam was performed according to the
departmental dose-optimization program which includes automated
exposure control, adjustment of the mA and/or kV according to
patient size and/or use of iterative reconstruction technique.

CONTRAST:  75mL OMNIPAQUE IOHEXOL 350 MG/ML SOLN
FINDINGS: CT HEAD FINDINGS

For noncontrast findings, please see same day CT head.

CTA NECK FINDINGS

Aortic arch: 3 vessel arch, with a common origin of the left and
right common carotid arteries, with an aberrant origin of the right
subclavian, which passes posterior to the esophagus. Imaged portion
shows no evidence of aneurysm or dissection. No significant stenosis
of the major arch vessel origins.

Right carotid system: No evidence of dissection, stenosis (50% or
greater) or occlusion.

Left carotid system: No evidence of dissection, stenosis (50% or
greater) or occlusion.

Vertebral arteries: Normal appearance of the right vertebral artery,
which is patent from its origin to the skull base. The left
vertebral artery is occluded from just distal to its origin (series
8, image 260), to the distal V2 segment (series 8, image 190) at
approximately the level of the C2 transverse foramen. Is diminutive
but patent from this level through the skull base. This may
represent retrograde flow or flow from collaterals.

Skeleton: Reversal of the normal cervical lordosis. No acute osseous
abnormality.

Other neck: Negative.

Upper chest: Scattered centrilobular nodules, which are nonspecific
but can be seen in the setting of bronchiolitis. No consolidative
pulmonary opacity or pleural effusion.

Review of the MIP images confirms the above findings

CTA HEAD FINDINGS

Anterior circulation: Both internal carotid arteries are patent to
the termini, without significant stenosis.

A1 segments patent. Normal anterior communicating artery. Anterior
cerebral arteries are patent to their distal aspects.

No M1 stenosis or occlusion. Normal MCA bifurcations. Distal MCA
branches perfused and symmetric.

Posterior circulation: Vertebral arteries patent to the
vertebrobasilar junction, with relatively diminished flow in the
left V4 segment. Posterior inferior cerebral arteries patent
bilaterally.

Basilar patent to its distal aspect. Superior cerebellar arteries
patent bilaterally.

Patent P1 segments. PCAs perfused to their distal aspects without
stenosis. The left posterior communicating artery is patent. The
right posterior communicating artery is not visualized.

Venous sinuses: As permitted by contrast timing, patent.

Anatomic variants: None significant.

Review of the MIP images confirms the above findings
IMPRESSION: 1. Non opacification of the left vertebral artery from just distal
to its origin through the V2 segment at the level of C2. Flow in the
distal V2, V3, and V4 segments may be retrograde or via collaterals.
The left PICA appears opacified. Additional flow to the posterior
circulation is supplied via a patent left posterior communicating
artery.
2. No other hemodynamically significant stenosis in the neck.
3. No intracranial large vessel occlusion. No other hemodynamically
significant stenosis.
4. Incidental note is made of variant aortic branching, with an
aberrant origin of the right subclavian and a common origin of the
left and right carotid arteries.

## 2022-02-13 IMAGING — MR MR HEAD W/O CM
12 of 13 series · 44 of 48 positions shown · non-contrast
Comparison: No prior MRI, correlation is made with CT head
[DATE]

CLINICAL DATA: Stroke suspected, neuro deficit

EXAM:
MRI HEAD WITHOUT CONTRAST
MRA HEAD WITHOUT CONTRAST
MRA NECK WITHOUT CONTRAST
TECHNIQUE: Multiplanar, multi-echo pulse sequences of the brain and surrounding
structures were acquired without intravenous contrast. Angiographic
images of the Circle of Willis were acquired using MRA technique
without intravenous contrast. Angiographic images of the neck were
acquired using MRA technique without intravenous contrast. Carotid
stenosis measurements (when applicable) are obtained utilizing
NASCET criteria, using the distal internal carotid diameter as the
denominator.

[Series 9: DWI · axial · 3.0mm · 0.88mm/px · z∈[-108,+26]mm · 8 of 96 slices shown (1 of 4)]
[im 1/96]
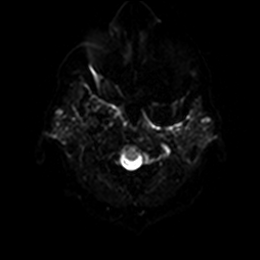
[im 14/96]
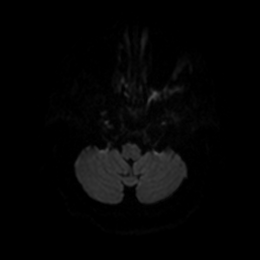
[im 28/96]
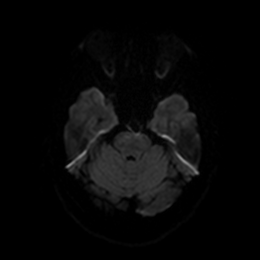
[im 41/96]
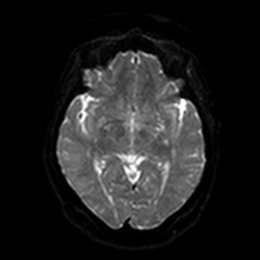
[im 55/96]
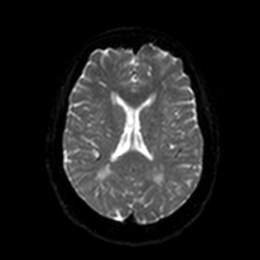
[im 68/96]
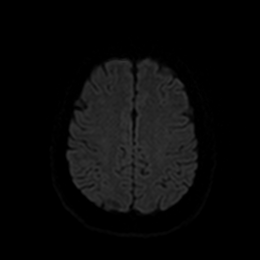
[im 82/96]
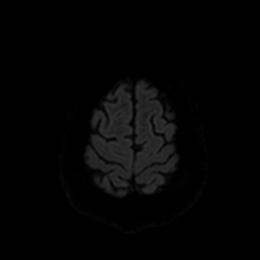
[im 96/96]
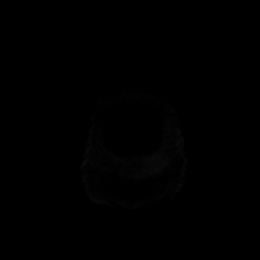

[Series 10: DWI · axial · 3.0mm · 0.88mm/px · z∈[-108,+26]mm · 4 of 48 slices shown (2 of 4)]
[im 1/48]
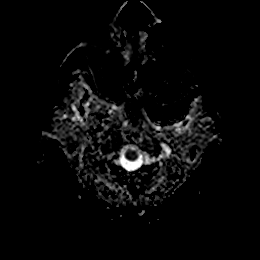
[im 16/48]
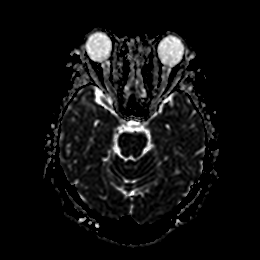
[im 32/48]
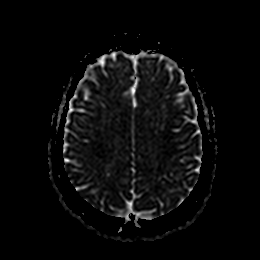
[im 48/48]
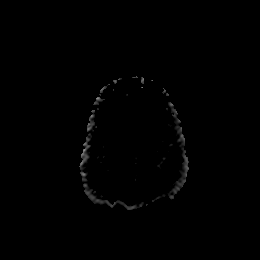

[Series 11: DWI · coronal · 4.0mm · 0.88mm/px · 5 of 70 slices shown (3 of 4)]
[im 1/70]
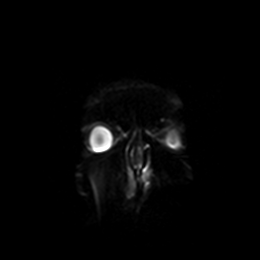
[im 18/70]
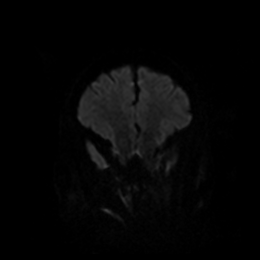
[im 35/70]
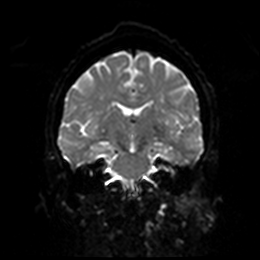
[im 52/70]
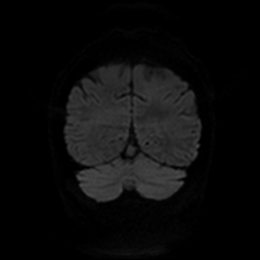
[im 70/70]
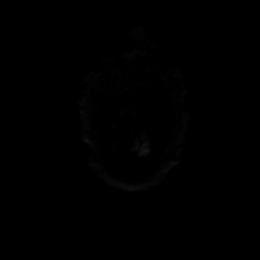

[Series 12: DWI · coronal · 4.0mm · 0.88mm/px · 3 of 35 slices shown (4 of 4)]
[im 1/35]
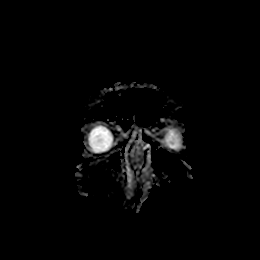
[im 18/35]
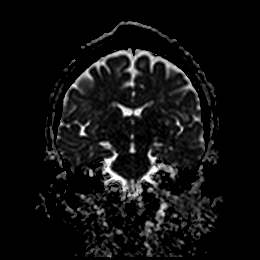
[im 35/35]
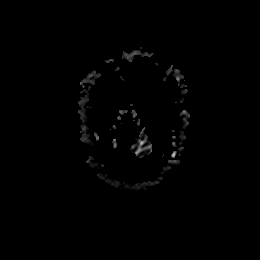

[Series 21: T1 · sagittal · 5.0mm · 0.75mm/px · 2 of 23 slices shown]
[im 1/23]
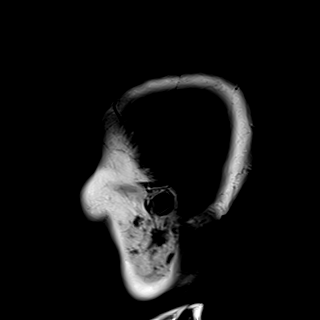
[im 23/23]
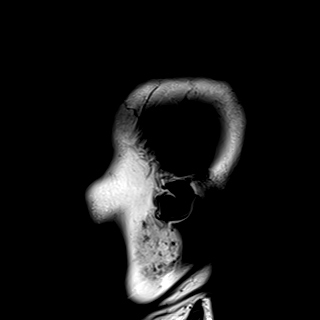

[Series 22: T2 · axial · 5.0mm · 0.72mm/px · z∈[-116,+19]mm · 2 of 25 slices shown (1 of 2)]
[im 1/25]
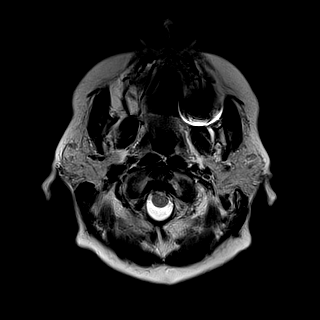
[im 25/25]
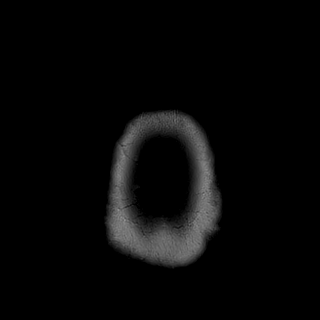

[Series 23: FLAIR · axial · 5.0mm · 0.45mm/px · z∈[-116,+19]mm · 2 of 25 slices shown]
[im 1/25]
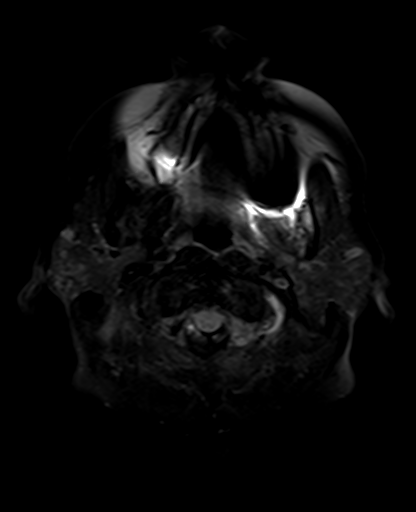
[im 25/25]
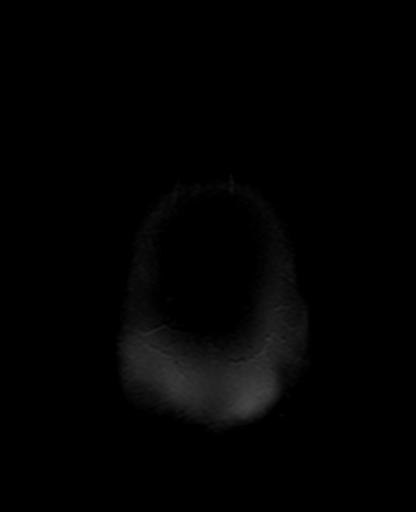

[Series 24: mag_images · axial · 3.0mm · 0.90mm/px · z∈[-131,+35]mm · 4 of 60 slices shown]
[im 1/60]
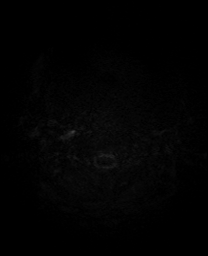
[im 20/60]
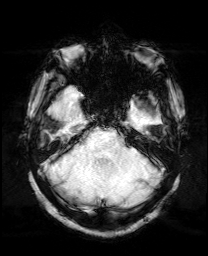
[im 40/60]
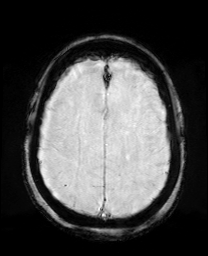
[im 60/60]
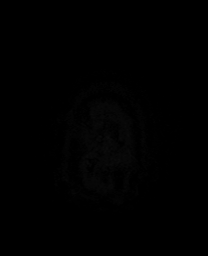

[Series 25: pha_images · axial · 3.0mm · 0.90mm/px · z∈[-131,+35]mm · 4 of 60 slices shown]
[im 1/60]
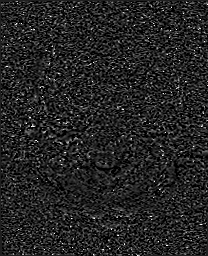
[im 20/60]
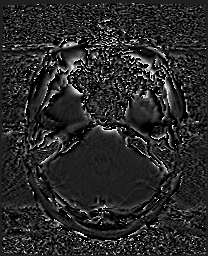
[im 40/60]
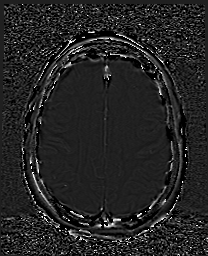
[im 60/60]
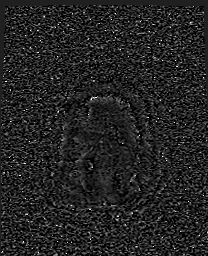

[Series 26: swi_images · axial · 3.0mm · 0.90mm/px · z∈[-131,+35]mm · 4 of 60 slices shown]
[im 1/60]
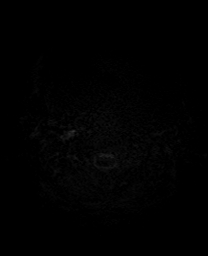
[im 20/60]
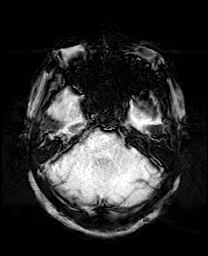
[im 40/60]
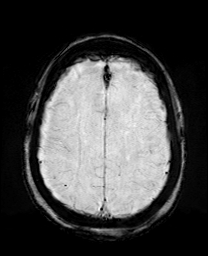
[im 60/60]
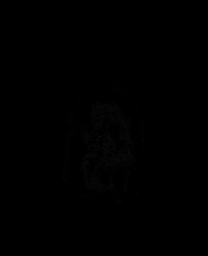

[Series 27: mip_images(sw) · axial · 24.0mm · 0.90mm/px · z∈[-121,+25]mm · 4 of 53 slices shown]
[im 1/53]
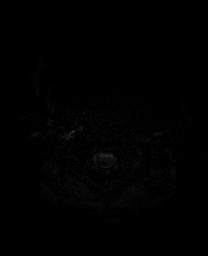
[im 18/53]
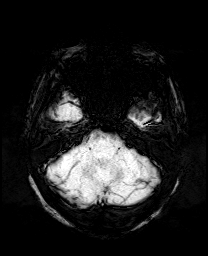
[im 35/53]
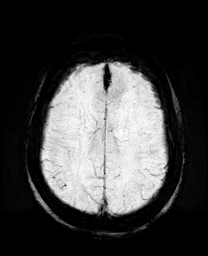
[im 53/53]
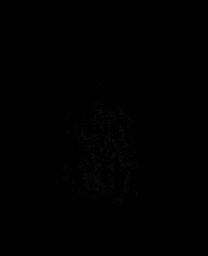

[Series 29: T2 · coronal · 5.0mm · 0.34mm/px · 2 of 29 slices shown (2 of 2)]
[im 1/29]
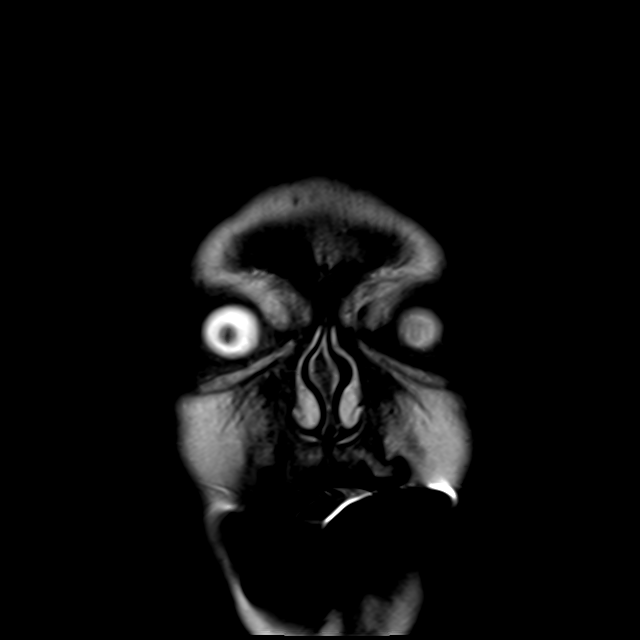
[im 29/29]
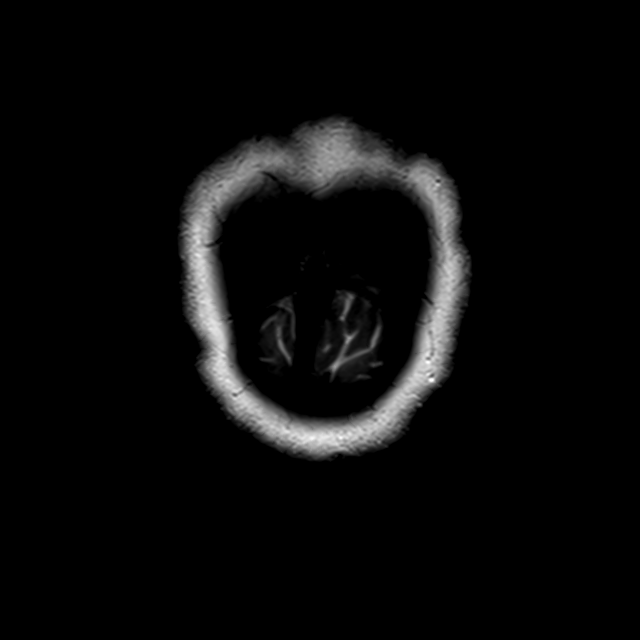

[44 of 48 positions shown; findings below may reference images not displayed]

FINDINGS: MRI HEAD FINDINGS

Brain: No restricted diffusion to suggest acute or subacute infarct.
No acute hemorrhage, mass, mass effect, or midline shift. No
hydrocephalus or extra-axial collection. Mild T2 hyperintense signal
in the periventricular white matter, likely the sequela of chronic
small vessel ischemic disease.

Vascular: Please see MRA findings below.

Skull and upper cervical spine: Normal marrow signal. Degenerative
changes in the cervical spine.

Sinuses/Orbits: Mucous retention cysts in the right maxillary sinus.
The orbits are unremarkable.

Other: Trace fluid in left mastoid air cells.  Asymmetry

MRA HEAD FINDINGS

Anterior circulation: Both internal carotid arteries are patent to
the termini, without significant stenosis. A1 segments patent.
Normal anterior communicating artery. Anterior cerebral arteries are
patent to their distal aspects. No M1 stenosis or occlusion. Normal
MCA bifurcations. Distal MCA branches perfused and symmetric.

Posterior circulation: The left V4 segment is poorly visualized
proximally but appears to be patent distally. The right V4 is patent
to the vertebrobasilar junction without stenosis. Posterior inferior
cerebral arteries patent bilaterally. Basilar patent to its distal
aspect. Superior cerebellar arteries patent bilaterally. Patent P1
segments. PCAs perfused to their distal aspects without stenosis.
The left posterior communicating artery is visualized. The right
posterior communicating artery is not visualized.

Anatomic variants: None significant

MRA NECK FINDINGS

Aortic arch: Poorly imaged due to artifact. Within this limitation,
no evidence of dissection or aneurysm.

Right carotid system: Poor visualization of the right common carotid
artery. The right ICA is patent, without significant stenosis. No
dissection or aneurysm.

Left carotid system: The left common carotid artery is poorly
visualized. The left ICA is patent, without significant stenosis. No
dissection or aneurysm.

Vertebral arteries: The left vertebral artery is not visualized
throughout its entire course. The right vertebral artery appears to
be patent from its origin to the skull base, although the origin is
poorly visualized.

Other: None
IMPRESSION: 1. No acute intracranial process. No evidence of acute or subacute
infarct.
2. Nonvisualization of the left V1, V2, and V3 segments; some signal
is seen in the distal V4 segment, possibly retrograde flow. This
suggests occlusion, but is of indeterminate acuity. Consider CTA for
further evaluation.
3. No other hemodynamically significant stenosis in the neck.
4. No other intracranial stenosis or large vessel occlusion.

## 2022-02-13 MED ORDER — SODIUM CHLORIDE 0.9 % IV BOLUS
500.0000 mL | Freq: Once | INTRAVENOUS | Status: AC
  Administered 2022-02-13: 500 mL via INTRAVENOUS

## 2022-02-13 MED ORDER — IOHEXOL 350 MG/ML SOLN
75.0000 mL | Freq: Once | INTRAVENOUS | Status: AC | PRN
  Administered 2022-02-13: 75 mL via INTRAVENOUS

## 2022-02-13 MED ORDER — METOCLOPRAMIDE HCL 5 MG/ML IJ SOLN
10.0000 mg | INTRAMUSCULAR | Status: AC
  Administered 2022-02-13: 10 mg via INTRAVENOUS
  Filled 2022-02-13: qty 2

## 2022-02-13 NOTE — Telephone Encounter (Signed)
Patient called and wanted to know if she needs to do continue taking her current medication before coming back to the office for another appointment  ?

## 2022-02-13 NOTE — ED Triage Notes (Signed)
Patient complains of pressure behind eyes and dizziness that started at 1200 today. Rotator cuff injury on left shoulder, difficulty holding left arm up. Patient alert, oriented, and in no apparent distress at this time. ? ?EMS Vitals ? BP 196/106 , hx htn, has taken her medications earlier today ?

## 2022-02-13 NOTE — ED Provider Triage Note (Addendum)
Emergency Medicine Provider Triage Evaluation Note ? ?Mary Dalton , a 66 y.o. female  was evaluated in triage.  Pt complains of sudden onset dizziness at 11-1229. Also Headache, vision changes, ataxia, and now is having some neck pain. She had some sob and htn when this started. Denies chest pain. Denies numbness, weakness. Took bp and it was 122 systolic she she took her bp meds pta ? ?Review of Systems  ?Positive: Dizziness, ha, vision changes, ataxia, neck pain ?Negative: sob ? ?Physical Exam  ?LMP  (LMP Unknown)  ?Gen:   Awake, no distress   ?Resp:  Normal effort  ?MSK:   Moves extremities without difficulty  ?Other:  Clear speech, appears tired. Perrl, eoms intact. Ataxic. No obvious asymmetric with strength testing of bue/ble. Slight drifts of rue (thought has hx rotator cuff tear on this arm) ? ?Medical Decision Making  ?Medically screening exam initiated at 3:15 PM.  Appropriate orders placed.  ZANETA LIGHTCAP was informed that the remainder of the evaluation will be completed by another provider, this initial triage assessment does not replace that evaluation, and the importance of remaining in the ED until their evaluation is complete. ? ? ?  ?Javonne Dorko S, PA-C ?02/13/22 1600 ? ?  ?Rodney Booze, PA-C ?02/13/22 4825 ? ?

## 2022-02-13 NOTE — ED Notes (Signed)
Patient transported to MRI 

## 2022-02-13 NOTE — Discharge Instructions (Addendum)
Your evaluation in the ED today has been reassuring.  We recommend that you remain compliant with your prescribed medications and continue to taking your blood pressure medicines as prescribed.  Follow-up with your primary care doctor for recheck regarding your visit to the ED today.  You may return for new or concerning symptoms. ?

## 2022-02-13 NOTE — ED Provider Notes (Incomplete)
?[facility] ?Provider Note ? ? ?CSN: [id number] ?Arrival date & time: 02/13/22  1514 ? ?An emergency department physician performed an initial assessment on this suspected stroke patient at 72. ? ?History ?{Add pertinent medical, surgical, social history, OB history to HPI:1} ?Chief Complaint  ?Patient presents with  ? Dizziness  ? ? ?Mary Dalton is a 66 y.o. female. ? ?66 year old female with a history of hypertension, CAD, ACS, hyperlipidemia, hypothyroid, esophageal reflux presents to the emergency department for evaluation of dizziness.  Code stroke activated in triage.  Patient states that she was at work when she had acute onset of dizziness characterized as the room spinning.  She is unable to attributed direction to this spinning sensation.  Symptoms began between 12-12:30PM today.  Symptoms associated with a throbbing headache behind her eyes. Reports "trouble seeing", but cannot characterize this further; however, denies ever experiencing complete vision loss. She took her BP medications upon arriving home as she forgot to take them this AM. Notes some associated nausea. No fevers, syncope, vomiting, new/worsening extremity weakness. Reports some tingling to her proximal RUE and shoulder, but no facial or other extremity paresthesias. Not chronically anticoagulated. Overall, she reports symptoms to be spontaneously improving. ? ?The history is provided by the patient. No language interpreter was used.  ?Dizziness ? ?  ? ?Home Medications ?Prior to Admission medications   ?Medication Sig Start Date End Date Taking? Authorizing Provider  ?amoxicillin (AMOXIL) 500 MG tablet Take 4 tablets one hour prior to dental procedure. 02/02/20   Magnant, Charles L, PA-C  ?cetirizine (ZYRTEC) 10 MG tablet Take 10 mg by mouth daily as needed for allergies.    [provider]  ?colchicine 0.6 MG tablet Take by mouth. 07/05/20   [provider]   ?hydrochlorothiazide (HYDRODIURIL) 12.5 MG tablet Take 12.5 mg by mouth daily. 06/08/20   [provider]  ?levothyroxine (SYNTHROID, LEVOTHROID) 112 MCG tablet Take 112 mcg by mouth daily before breakfast.    [provider]  ?metoprolol tartrate (LOPRESSOR) 25 MG tablet Take 1 tablet (25 mg total) by mouth 2 (two) times daily. 10/26/15   Thurnell Lose, MD  ?Multiple Vitamins-Minerals (MULTIVITAMIN WITH MINERALS) tablet Take 1 tablet by mouth every other day.    [provider]  ?Vitamin D, Ergocalciferol, (DRISDOL) 1.25 MG (50000 UNIT) CAPS capsule Take 1 capsule (50,000 Units total) by mouth every 7 (seven) days. 01/22/22   Criselda Peaches, DPM  ?   ? ?Allergies    ?No known allergies   ? ?Review of Systems   ?Review of Systems  ?Neurological:  Positive for dizziness.  ?Ten systems reviewed and are negative for acute change, except as noted in the HPI.  ? ? ?Physical Exam ?Updated Vital Signs ?BP (!) 187/95   Pulse 63   Temp 98.6 ?F (37 ?C) (Oral)   Resp 14   LMP  (LMP Unknown)   SpO2 100%  ? ?Physical Exam ?Vitals and nursing note reviewed.  ?Constitutional:   ?   General: She is not in acute distress. ?   Appearance: She is well-developed. She is not diaphoretic.  ?   Comments: Nontoxic appearing and in NAD. Pleasant AA female.  ?HENT:  ?   Head: Normocephalic and atraumatic.  ?Eyes:  ?   General: No scleral icterus. ?   Conjunctiva/sclera: Conjunctivae normal.  ?Neck:  ?   Comments: No meningismus  ?Pulmonary:  ?   Effort: Pulmonary effort is normal. No respiratory  distress.  ?   Comments: Respirations even and unlabored ?Musculoskeletal:     ?   General: Normal range of motion.  ?   Cervical back: Normal range of motion.  ?Skin: ?   General: Skin is warm and dry.  ?   Coloration: Skin is not pale.  ?   Findings: No erythema or rash.  ?Neurological:  ?   Mental Status: She is alert and oriented to person, place, and time.  ?   Cranial Nerves: No cranial nerve deficit.  ?    Coordination: Coordination normal.  ?   Comments: GCS 15. Speech is goal oriented. No cranial nerve deficits appreciated; symmetric eyebrow raise, no facial drooping, tongue midline. Patient moves extremities spontaneously, symmetrically. Gait not assessed.  ?Psychiatric:     ?   Behavior: Behavior normal.  ? ? ?ED Results / Procedures / Treatments   ?Labs ?(all labs ordered are listed, but only abnormal results are displayed) ?Labs Reviewed  ?COMPREHENSIVE METABOLIC PANEL - Abnormal; Notable for the following components:  ?    Result Value  ? Glucose, Bld 102 (*)   ? All other components within normal limits  ?URINALYSIS, ROUTINE W REFLEX MICROSCOPIC - Abnormal; Notable for the following components:  ? Hgb urine dipstick SMALL (*)   ? Ketones, ur 5 (*)   ? Bacteria, UA RARE (*)   ? All other components within normal limits  ?I-STAT CHEM 8, ED - Abnormal; Notable for the following components:  ? Glucose, Bld 101 (*)   ? Calcium, Ion 1.12 (*)   ? All other components within normal limits  ?RESP PANEL BY RT-PCR (FLU A&B, COVID) ARPGX2  ?CBC WITH DIFFERENTIAL/PLATELET  ?PROTIME-INR  ?APTT  ?RAPID URINE DRUG SCREEN, HOSP PERFORMED  ?ETHANOL  ?TROPONIN I (HIGH SENSITIVITY)  ?TROPONIN I (HIGH SENSITIVITY)  ? ? ?EKG ?None ? ?Radiology ?CT HEAD CODE STROKE WO CONTRAST ? ?Result Date: 02/13/2022 ?CLINICAL DATA:  Code stroke.  Neuro deficit, acute, stroke suspected EXAM: CT HEAD WITHOUT CONTRAST TECHNIQUE: Contiguous axial images were obtained from the base of the skull through the vertex without intravenous contrast. RADIATION DOSE REDUCTION: This exam was performed according to the departmental dose-optimization program which includes automated exposure control, adjustment of the mA and/or kV according to patient size and/or use of iterative reconstruction technique. COMPARISON:  None. FINDINGS: Brain: No evidence of acute large vascular territory infarction, hemorrhage, hydrocephalus, extra-axial collection or mass  lesion/mass effect. Patchy white matter hypoattenuation, nonspecific but compatible with chronic microvascular ischemic disease. Vascular: No hyperdense vessel identified. Calcific intracranial atherosclerosis. Skull: No acute fracture. Sinuses/Orbits: Largely clear sinuses.  No acute orbital findings. Other: No mastoid effusions. ASPECTS [facility] Stroke Program Early CT Score) total score (0-10 with 10 being normal): 10. IMPRESSION: 1. No evidence of acute intracranial abnormality. ASPECTS is 10. 2. Presumed chronic microvascular ischemic disease. MRI could further evaluate if clinically indicated. 3. Partially empty sella, which is often a normal anatomic variant but can be associated with idiopathic intracranial hypertension. Code stroke imaging results were communicated on 02/13/2022 at 4:21 pm to provider Dr. Leonel Ramsay via secure text paging. Electronically Signed   By: Margaretha Sheffield M.D.   On: 02/13/2022 16:23   ? ?Procedures ?Procedures  ?{Document cardiac monitor, telemetry assessment procedure when appropriate:1} ? ?Medications Ordered in ED ?Medications  ?metoCLOPramide (REGLAN) injection 10 mg (has no administration in time range)  ?sodium chloride 0.9 % bolus 500 mL (has no administration in time range)  ? ? ?ED Course/ Medical  Decision Making/ A&P ?  ?                        ?Medical Decision Making ?Risk ?Prescription drug management. ? ? ?*** ? ?{Document critical care time when appropriate:1} ?{Document review of labs and clinical decision tools ie heart score, Chads2Vasc2 etc:1}  ?{Document your independent review of radiology images, and any outside records:1} ?{Document your discussion with family members, caretakers, and with consultants:1} ?{Document social determinants of health affecting pt's care:1} ?{Document your decision making why or why not admission, treatments were needed:1} ?Final Clinical Impression(s) / ED Diagnoses ?Final diagnoses:  ?None  ? ? ?Rx / DC Orders ?ED Discharge  Orders   ? ? None  ? ?  ? ? ?

## 2022-02-13 NOTE — Code Documentation (Signed)
Stroke Response Nurse Documentation ?Code Documentation ? ?Mary Dalton is a 66 y.o. female arriving to [facility]  via [facility] on 02/13/2022 with past medical hx of hypertension, GERD, hypothyroidism, metabolic syndrome, gout, CAD, stroke, STEMI, high cholesterol, and NSTEMI. Previously instructed to take daily aspirin, but remembers to do so once a week.  ? ?Patient from home where she was LKW at 1200 while at work where she developed dizziness and elevated BP. Later at home developed a periorbital headache. Called 911. Code stroke was activated by ED. Daughter at bedside in ED.  ? ?Stroke team at the bedside on patient arrival. Labs drawn and patient cleared for CT by Dr. Francia Greaves. Patient to CT with team. NIHSS 0, see documentation for details and code stroke times. No deficits noted on exam. The following imaging was completed:  CT Head. Patient is not a candidate for IV Thrombolytic due to too mild to treat. BP 168/98. ? ?Care Plan: MRI; Q2 assessments after 1630 assessment.  ? ?Bedside handoff with ED RN.   ? ?Leverne Humbles ?Stroke Response RN ? ? ?

## 2022-02-13 NOTE — ED Notes (Signed)
Patient is back from MRI  

## 2022-02-13 NOTE — Consult Note (Signed)
Neurology Consultation  Reason for Consult: Code Stroke Referring Physician: Dr. Francia Greaves  CC: Dizziness, pain behind bilateral eyes  History is obtained from: Patient, Chart review  HPI: Mary Dalton is a 66 y.o. female with a medical history significant for nonobstructive CAD, hyperlipidemia, remote NSTEMI 2016 and STEMI 2018, essential hypertension, migraine headaches, and metabolic syndrome who presented to the ED 3/13 via EMS from home for evaluation of acute onset of dizziness with bilateral periorbital throbbing headache. Patient states that she was in her usual state of health while at work today when she had an acute onset of dizziness at 12:00 PM described as a room spinning sensation. She states that her dizziness continued and she developed a throbbing headache behind both eyes with associated photophobia while at home and activated EMS for further evaluation. Initially, she recalls that her blood pressure at work was elevated to 206/136 with some improvement to a systolic blood pressure of 967'R systolic at home. Due to ongoing dizziness, patient presented to the ED for further evaluation and was activated as a Code Stroke in triage on arrival.   LKW: 12:00 TNK given?: no, patient's symptoms are felt to be too mild to treat with an initial NIHSS of 0 IR Thrombectomy? No, patient's presentation is not consistent with an LVO Modified Rankin Scale: 0-Completely asymptomatic and back to baseline post- stroke  ROS: A complete ROS was performed and is negative except as noted in the HPI.   Past Medical History:  Diagnosis Date   Acute blood loss as cause of postoperative anemia 08/19/2017   Arthritis    "knees" (08/14/2017)   CAD (coronary artery disease), native coronary artery 10/2015   diffuse disease, but non obstructive   Elevated troponin 10/24/2015   Gastroesophageal reflux disease without esophagitis    GERD (gastroesophageal reflux disease)    Gout    High cholesterol     History of non-ST elevation myocardial infarction (NSTEMI) 10/24/2015   History of total knee arthroplasty 03/27/2017   right   Hypertension    Hypothyroidism    Metabolic syndrome    Primary osteoarthritis of right knee 01/10/2017   STEMI (ST elevation myocardial infarction) ([place]) "~ 10/2015"   Archie Endo 08/12/2017   Stroke ([place])    "dx'd in 10/2015 when I went in for my cardiac cath" (03/20/2017)   Past Surgical History:  Procedure Laterality Date   CARDIAC CATHETERIZATION N/A 10/26/2015   Procedure: Left Heart Cath and Coronary Angiography;  Surgeon: Jettie Booze, MD;  Location: [facility];  Service: Cardiovascular;  Laterality: N/A;   JOINT REPLACEMENT     LAPAROSCOPIC CHOLECYSTECTOMY     SHOULDER ARTHROSCOPY WITH OPEN ROTATOR CUFF REPAIR Bilateral    TOTAL KNEE ARTHROPLASTY Left 03/20/2017   Procedure: TOTAL KNEE ARTHROPLASTY;  Surgeon: Meredith Pel, MD;  Location: [facility];  Service: Orthopedics;  Laterality: Left;   TOTAL KNEE ARTHROPLASTY Right 08/14/2017   TOTAL KNEE ARTHROPLASTY Right 08/14/2017   Procedure: RIGHT TOTAL KNEE ARTHROPLASTY;  Surgeon: Meredith Pel, MD;  Location: [facility];  Service: Orthopedics;  Laterality: Right;   TUBAL LIGATION     Family History  Problem Relation Age of Onset   Healthy Mother    Diabetes Mother    Healthy Father    Healthy Brother    Healthy Brother    Healthy Brother    Breast cancer Neg Hx    Social History:   reports that she has never smoked. She has never used smokeless  tobacco. She reports that she does not drink alcohol and does not use drugs.  Medications No current facility-administered medications for this encounter.  Current Outpatient Medications:    amoxicillin (AMOXIL) 500 MG tablet, Take 4 tablets one hour prior to dental procedure., Disp: 12 tablet, Rfl: 0   cetirizine (ZYRTEC) 10 MG tablet, Take 10 mg by mouth daily as needed for allergies., Disp: , Rfl:    colchicine 0.6 MG tablet, Take by  mouth., Disp: , Rfl:    hydrochlorothiazide (HYDRODIURIL) 12.5 MG tablet, Take 12.5 mg by mouth daily., Disp: , Rfl:    levothyroxine (SYNTHROID, LEVOTHROID) 112 MCG tablet, Take 112 mcg by mouth daily before breakfast., Disp: , Rfl:    metoprolol tartrate (LOPRESSOR) 25 MG tablet, Take 1 tablet (25 mg total) by mouth 2 (two) times daily., Disp: 60 tablet, Rfl: 0   Multiple Vitamins-Minerals (MULTIVITAMIN WITH MINERALS) tablet, Take 1 tablet by mouth every other day., Disp: , Rfl:    Vitamin D, Ergocalciferol, (DRISDOL) 1.25 MG (50000 UNIT) CAPS capsule, Take 1 capsule (50,000 Units total) by mouth every 7 (seven) days., Disp: 8 capsule, Rfl: 0  Exam: Current vital signs: BP (!) 159/87 (BP Location: Right Arm)    Pulse 62    Temp 98.6 F (37 C) (Oral)    Resp 16    LMP  (LMP Unknown)    SpO2 99%  Vital signs in last 24 hours: Temp:  [98.6 F (37 C)] 98.6 F (37 C) (03/13 1532) Pulse Rate:  [62] 62 (03/13 1532) Resp:  [16] 16 (03/13 1532) BP: (159)/(87) 159/87 (03/13 1532) SpO2:  [99 %] 99 % (03/13 1532)  GENERAL: Awake, alert, in no acute distress. She does appear slightly uncomfortable preferring to keep her left eye closed during examination.  Psych: Affect appropriate for situation, patient is calm and cooperative with examination Head: Normocephalic and atraumatic, without obvious abnormality EENT: Normal conjunctivae, dry mucous membranes, no OP obstruction LUNGS: Normal respiratory effort. Non-labored breathing on room air CV: Regular rate and rhythm on telemetry ABDOMEN: Soft, non-tender, non-distended Extremities: Warm, well perfused, without obvious deformity  NEURO:  Mental Status: Awake, alert, and oriented to person, place, time, and situation. She is able to provide a clear and coherent history of present illness. Speech/Language: speech is intact without dysarthria.   Naming, repetition, fluency, and comprehension intact without aphasia. No neglect is noted Cranial  Nerves:  II: PERRL. Visual fields full.  III, IV, VI: EOMI without ptosis, nystagmus, or gaze preference.  V: Sensation is intact to light touch and symmetrical to face.  VII: Face is symmetric resting and smiling. VIII: Hearing is intact to voice IX, X: Palate elevation is symmetric. Phonation normal.  XI: Normal sternocleidomastoid and trapezius muscle strength XII: Tongue protrudes midline without fasciculations.   Motor: 5/5 strength is all muscle groups without vertical drift.  Tone is normal. Bulk is normal.  Sensation: Intact to light touch bilaterally in all four extremities. No extinction to DSS present.  Coordination: FTN intact bilaterally. HKS intact bilaterally. No pronator drift.  Gait: Deferred  NIHSS: 0  Labs I have reviewed labs in epic and the results pertinent to this consultation are: CBC    Component Value Date/Time   WBC 6.4 05/20/2018 1149   RBC 4.26 05/20/2018 1149   HGB 13.3 02/13/2022 1619   HCT 39.0 02/13/2022 1619   PLT 379 05/20/2018 1149   MCV 92.7 05/20/2018 1149   MCH 29.8 05/20/2018 1149   MCHC 32.2 05/20/2018 1149  RDW 14.3 05/20/2018 1149   LYMPHSABS 1.6 08/16/2007 1336   MONOABS 0.3 08/16/2007 1336   EOSABS 0.1 08/16/2007 1336   BASOSABS 0.1 08/16/2007 1336   CMP     Component Value Date/Time   NA 141 02/13/2022 1619   NA 139 03/28/2017 0000   K 4.1 02/13/2022 1619   CL 105 02/13/2022 1619   CO2 25 08/02/2017 1126   GLUCOSE 101 (H) 02/13/2022 1619   BUN 14 02/13/2022 1619   BUN 16 03/28/2017 0000   CREATININE 0.70 02/13/2022 1619   CALCIUM 8.8 11/10/2021 0918   PROT 7.5 10/24/2015 1700   ALBUMIN 3.5 10/24/2015 1700   AST 40 10/24/2015 1700   ALT 35 10/24/2015 1700   ALKPHOS 152 (H) 10/24/2015 1700   BILITOT 0.7 10/24/2015 1700   GFRNONAA >60 08/02/2017 1126   GFRAA >60 08/02/2017 1126   Lipid Panel     Component Value Date/Time   CHOL 141 10/25/2015 0247   TRIG 78 10/25/2015 0247   HDL 33 (L) 10/25/2015 0247    CHOLHDL 4.3 10/25/2015 0247   VLDL 16 10/25/2015 0247   LDLCALC 92 10/25/2015 0247   Lab Results  Component Value Date   HGBA1C 6.6 (H) 10/25/2015   Imaging I have reviewed the images obtained:  CT-scan of the brain 3/13: 1. No evidence of acute intracranial abnormality. ASPECTS is 10. 2. Presumed chronic microvascular ischemic disease. MRI could further evaluate if clinically indicated. 3. Partially empty sella, which is often a normal anatomic variant but can be associated with idiopathic intracranial hypertension.  Assessment: 66 y.o. female who presented to the ED 3/13 for evaluation of acute onset of dizziness/room spinning sensation with bilateral throbbing periorbital headache at 12:00 PM today. Patient with associated photophobia and reported blood pressure elevation to 206/136 at work.  - Examination reveals patient that appears slightly uncomfortable with complaints of headache and light sensitivity. She does report increased dizziness when changing position from laying to sitting at bedside. Patient's initial NIHSS is 0. - Imaging obtained without evidence of acute intracranial abnormality.  - Presentation is concerning for acute cerebral infarction versus hypertensive urgency versus complex migraine headache (initial blood pressure reported as 206/136 with onset of symptoms while at work, improved to a SBP of 190's at home). Will obtain MRI brain for further evaluation.  - Patient not given TNKase as her symptoms are felt to be too mild to treat with an initial NIHSS of 0.  Recommendations: - MRI brain without contrast - MRA head and neck without contrast - If MRI negative for acute infarction would recommend migraine and blood pressure management and disposition per EDP - If MRI findings concerning for acute ischemia, will expand to full stroke work up at that time  Anibal Henderson, [place] Pager: [phone]  I have seen the patient and reviewed  the above note.  Her symptoms are mild and therefore I discussed with her that I felt that the risks of tenecteplase would outweigh the benefits even if this was a mild stroke. My suspicion is for complicated migraine, but would favor further evaluation with MRI/MRA.   If this is negative, no need for further workup and would treat as complicated migraine.   Roland Rack, MD [facility] [phone]  If 7pm- 7am, please page neurology on call as listed in [place].

## 2022-02-17 DIAGNOSIS — I10 Essential (primary) hypertension: Secondary | ICD-10-CM | POA: Diagnosis not present

## 2022-02-17 DIAGNOSIS — E785 Hyperlipidemia, unspecified: Secondary | ICD-10-CM | POA: Diagnosis not present

## 2022-02-17 DIAGNOSIS — G450 Vertebro-basilar artery syndrome: Secondary | ICD-10-CM | POA: Diagnosis not present

## 2022-02-17 DIAGNOSIS — I2510 Atherosclerotic heart disease of native coronary artery without angina pectoris: Secondary | ICD-10-CM | POA: Diagnosis not present

## 2022-02-17 DIAGNOSIS — R42 Dizziness and giddiness: Secondary | ICD-10-CM | POA: Diagnosis not present

## 2022-03-15 ENCOUNTER — Other Ambulatory Visit: Payer: Self-pay | Admitting: Internal Medicine

## 2022-03-15 DIAGNOSIS — Z1231 Encounter for screening mammogram for malignant neoplasm of breast: Secondary | ICD-10-CM

## 2022-03-16 ENCOUNTER — Ambulatory Visit: Payer: Medicare HMO | Admitting: Podiatry

## 2022-03-16 DIAGNOSIS — E559 Vitamin D deficiency, unspecified: Secondary | ICD-10-CM | POA: Diagnosis not present

## 2022-03-16 DIAGNOSIS — M19072 Primary osteoarthritis, left ankle and foot: Secondary | ICD-10-CM

## 2022-03-16 DIAGNOSIS — R2689 Other abnormalities of gait and mobility: Secondary | ICD-10-CM

## 2022-03-16 DIAGNOSIS — M19079 Primary osteoarthritis, unspecified ankle and foot: Secondary | ICD-10-CM | POA: Diagnosis not present

## 2022-03-16 MED ORDER — VITAMIN D (ERGOCALCIFEROL) 1.25 MG (50000 UNIT) PO CAPS: 50000.0000 [IU] | ORAL_CAPSULE | ORAL | 0 refills | Status: DC

## 2022-03-20 NOTE — Progress Notes (Signed)
?Subjective:  ?Patient ID: Mary Dalton, female    DOB: 1956/10/04,  MRN: [id number] ? ?Chief Complaint  ?Patient presents with  ? consult  ? ? ?66 y.o. female presents with the above complaint. History confirmed with patient.  Returns with a new complaint of increasing pain in her bunion deformity.  She is obviously had these for many years but they are becoming increasingly bothersome.  Worst in shoe gears but starting to pain in the joint as well as the midfoot on the left side as well.  There is a large bone spur growing as well. ? ? ?Interval history: ?She is doing okay the foot is still quite painful.  She is ready to plan for her surgery in the fall she has been taking the vitamin D supplement as directed. ? ?Objective:  ?Physical Exam: ?warm, good capillary refill, no trophic changes or ulcerative lesions, normal DP and PT pulses, and normal sensory exam.  Bilateral she has severe hallux valgus deformity with the hallux abutting the second toe and rubbing a callus on the second hammertoe as well.  Palpable dorsal spurring at the MTPJ on the left foot and TMT J on the left foot second as well. ? ?Radiographs: ?Multiple views x-ray of both feet: Moderate to severe hallux valgus with arthritic changes within the metatarsophalangeal joint bilateral, looks like previous Lisfranc fracture and osteoarthritis of second TMT J on the left foot ? ?Study Result ? ?Narrative & Impression  ?CLINICAL DATA:  Chronic foot pain ?  ?EXAM: ?CT OF THE LEFT FOOT WITHOUT CONTRAST ?  ?TECHNIQUE: ?Multidetector CT imaging of the left foot was performed according to ?the standard protocol. Multiplanar CT image reconstructions were ?also generated. ?  ?COMPARISON:  Radiographs 09/22/2021 ?  ?FINDINGS: ?Bones/Joint/Cartilage ?  ?Corticated fragments along the volar-medial margin of the base of ?the second metatarsal noted, potentially representing part of the ?attachment of the Lisfranc ligament. There is expected alignment  at ?the Lisfranc joint. Substantial osteoarthritis with prominent ?articular space narrowing and confluent degenerative subcortical ?cyst formation as well as marginal osteophytes between the middle ?cuneiform and the base of the second metatarsal. Moderate to ?prominent osteoarthritis between the lateral cuneiform and the base ?of the third metatarsal. Mild to moderate osteoarthritis with loss ?of articular space between the cuboid and fourth and fifth ?metatarsal bases, with degenerative subcortical cyst formation in ?the distal cuboid adjacent to the fourth metatarsal articulation. ?There is some mild loss of articular space at the articulation ?between the medial cuneiform and the base of the first metatarsal. ?Given the lack of malalignment, I am skeptical of overt Lisfranc ?joint instability. ?  ?Moderate degenerative arthropathy at the first MTP joint with more ?striking osteoarthritis between the first metatarsal head in the ?sesamoids, with associated spurring and loss of articular cartilage. ?Mild hallux valgus with bony bunion. Medial cortical irregularity ?along the head of the first metatarsal, cannot exclude a small ?tophus in the setting of gout arthropathy. ?  ?Degenerative subcortical cyst proximally in the medial cuneiform ?with mild degenerative midfoot chondral thinning. ?  ?Plantar calcaneal spur. ?  ?Loss of height of the normal longitudinal arch of the foot raising ?concern for pes planus. ?  ?Ligaments ?  ?Suboptimally assessed by CT. Suspected thickening of the ?superomedial portion of the spring ligament for example on image 39 ?series 4. ?  ?Muscles and Tendons ?  ?Expanded and indistinct distal tibialis posterior tendon, possible ?tibialis posterior tendinopathy. Mild fusiform prominence of the ?distal Achilles tendon suggesting Achilles tendinopathy. ?  ?  Soft tissues ?  ?Nonspecific edema in the subcutaneous tissues plantar to the fifth ?MTP joint. ?  ?IMPRESSION: ?1. Varying degrees of  osteoarthritis at the Lisfranc joint, most ?severe between the middle cuneiform and the base of the second ?metatarsal. Small chronic well corticated bony fragments along the ?dorsal-medial margin of the base of the second metatarsal may ?partially correspond to attachment sites of the Lisfranc ligament ?but most of the Lisfranc ligament attachment is along the main ?shaft, and there is no malalignment at the Lisfranc joint to suggest ?Lisfranc joint instability. ?2. Moderate degenerative arthropathy of the first MTP joint and in ?particular between the head of the first metatarsal and the ?sesamoids. There is mild hallux valgus and bony bunion along with ?some mild cortical irregularity along the medial head of the first ?metatarsal, cannot exclude a small tophus in the setting of gout ?arthropathy. ?3. Loss of height of the normal longitudinal arch of the foot ?concerning for pes planus. There is expansion of the distal tibialis ?posterior tendon suspicious for tendinopathy, as well as some ?thickening of the superomedial portion of the spring ligament. ?4. Mild distal Achilles tendinopathy. ?5. Nonspecific edema in the subcutaneous tissues plantar to the ?fifth MTP joint. ?6. Mild degenerative midfoot arthropathy. ?7. Plantar calcaneal spur. ?  ?  ?Electronically Signed ?  By: Van Clines M.D. ?  On: 10/09/2021 11:32  ? ? ?Vitamin D 25 hydroxy 11/10/2021: ?Vit D, 25-Hydroxy 30.0 - 100.0 ng/mL 17.3 Low    ? ?Assessment:  ? ?1. Osteoarthritis of first metatarsophalangeal joint   ?2. Arthritis of midtarsal joint of left foot   ?3. Hypovitaminosis D   ? ? ? ? ?Plan:  ?Patient was evaluated and treated and all questions answered. ? ?Today we again discussed surgical treatment of her left and right foot deformities.  Both feet are quite painful.  She is ready to schedule surgery for August of this year for her left foot.  Surgical we discussed arthrodesis of the first metatarsal phalangeal joint as well as the  second and third tarsometatarsal joints as well as bone graft from the heel to treat the arthritic areas of the midtarsal joint as well as the hallux valgus deformity that is quite severe.  We discussed the risk benefits and potential complications of this including but not limited to pain, swelling, infection, scar, numbness which may be temporary or permanent, chronic pain, stiffness, nerve pain or damage, wound healing problems, bone healing problems including delayed or non-union.  We discussed her low vitamin D can put her at high risk of bone healing issues.  All questions were addressed.  No guarantees as the outcome of surgery were able to be made.  Informed consent was signed and reviewed. ? ? ?Surgical plan: ? ?Procedure: ?-Left foot first metatarsophalangeal joint and second and third tarsometatarsal joint arthrodeses.  Calcaneal autograft from ipsilateral heel ? ?Location: ?-[facility] ? ?Anesthesia plan: ?-IV sedation with regional block ? ?Postoperative pain plan: ?- Tylenol 1000 mg every 6 hours, gabapentin 300 mg every 8 hours x5 days, oxycodone 5 mg 1-2 tabs every 6 hours only as needed ? ?DVT prophylaxis: ?-Xarelto 10 mg nightly ? ?WB Restrictions / DME needs: ?-NWB in CAM boot ? ? ?Return in about 3 months (around 06/15/2022) for pre surgery visit .  ? ? ?

## 2022-03-24 ENCOUNTER — Ambulatory Visit
Admission: RE | Admit: 2022-03-24 | Discharge: 2022-03-24 | Disposition: A | Discharge status: Home / Self Care | Payer: Medicare HMO | Source: Ambulatory Visit | Attending: Internal Medicine | Admitting: Internal Medicine

## 2022-03-24 DIAGNOSIS — Z1231 Encounter for screening mammogram for malignant neoplasm of breast: Secondary | ICD-10-CM

## 2022-03-24 IMAGING — MG MM DIGITAL SCREENING BILAT W/ TOMO AND CAD
6 of 10 series · 6 of 30 positions shown · non-contrast
Comparison: Previous exam(s).

CLINICAL DATA: Screening.

EXAM:
DIGITAL SCREENING BILATERAL MAMMOGRAM WITH TOMOSYNTHESIS AND CAD
TECHNIQUE: Bilateral screening digital craniocaudal and mediolateral oblique
mammograms were obtained. Bilateral screening digital breast
tomosynthesis was performed. The images were evaluated with
computer-aided detection.

[R CC synth-2D (1 of 2)]
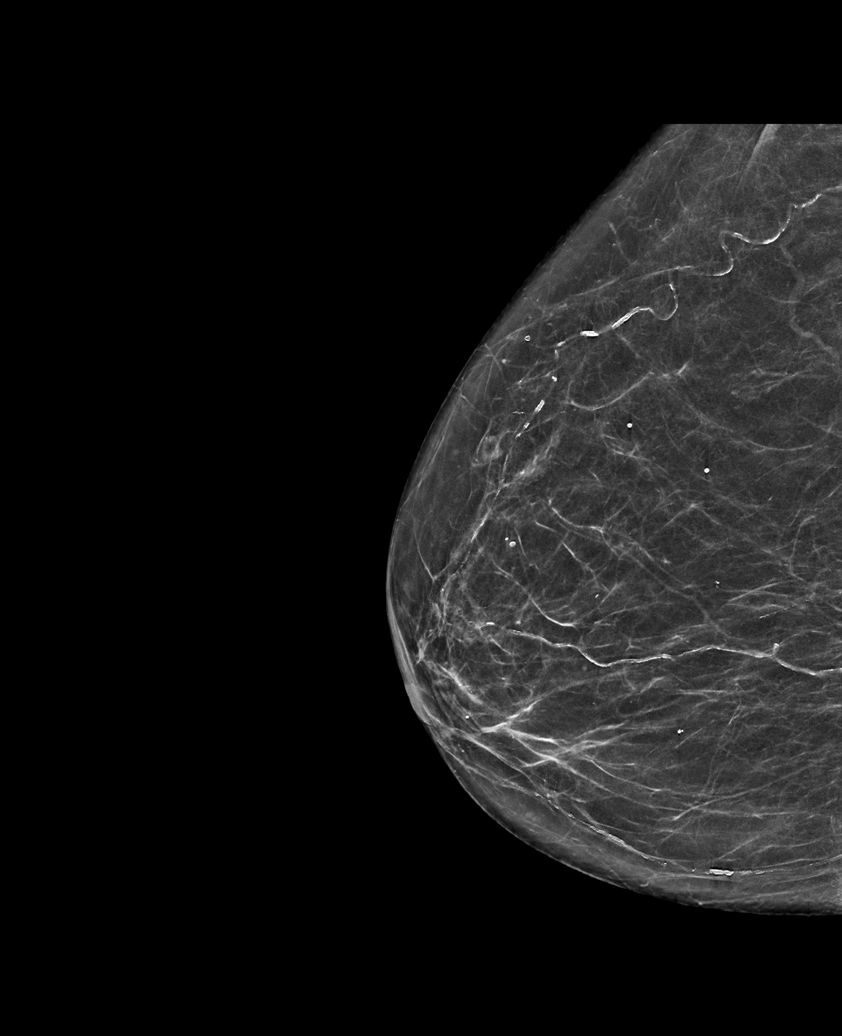

[L CC synth-2D]
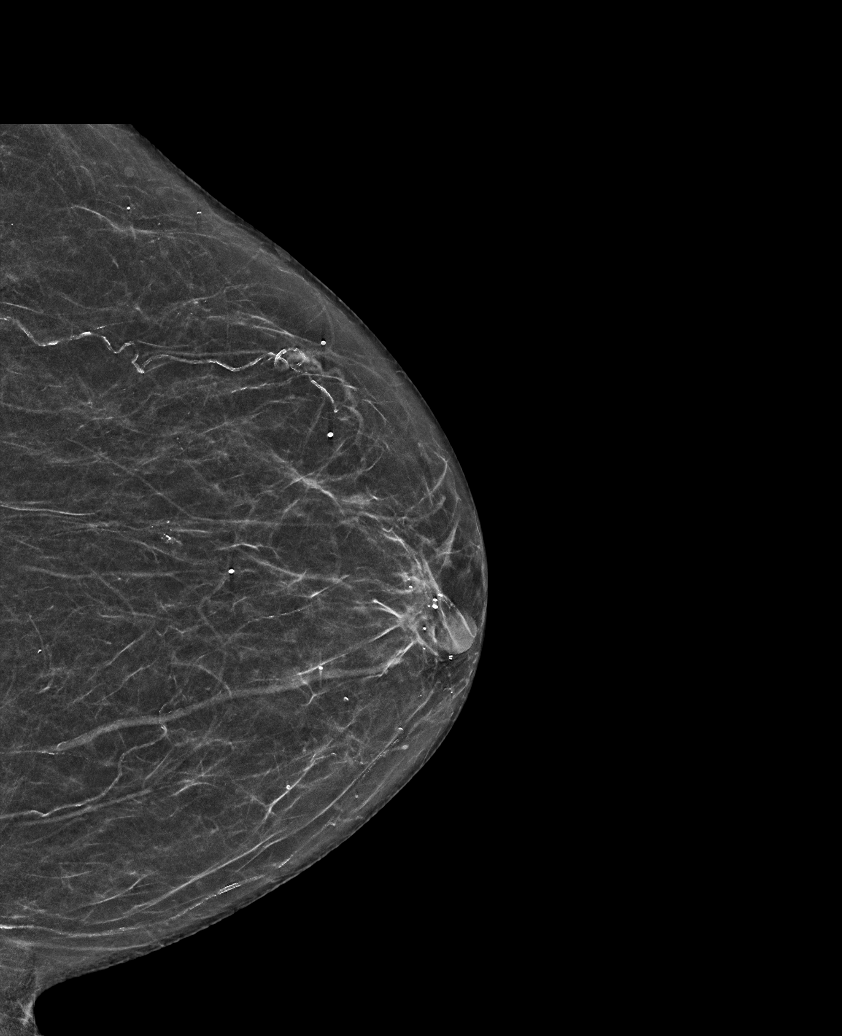

[R MLO synth-2D]
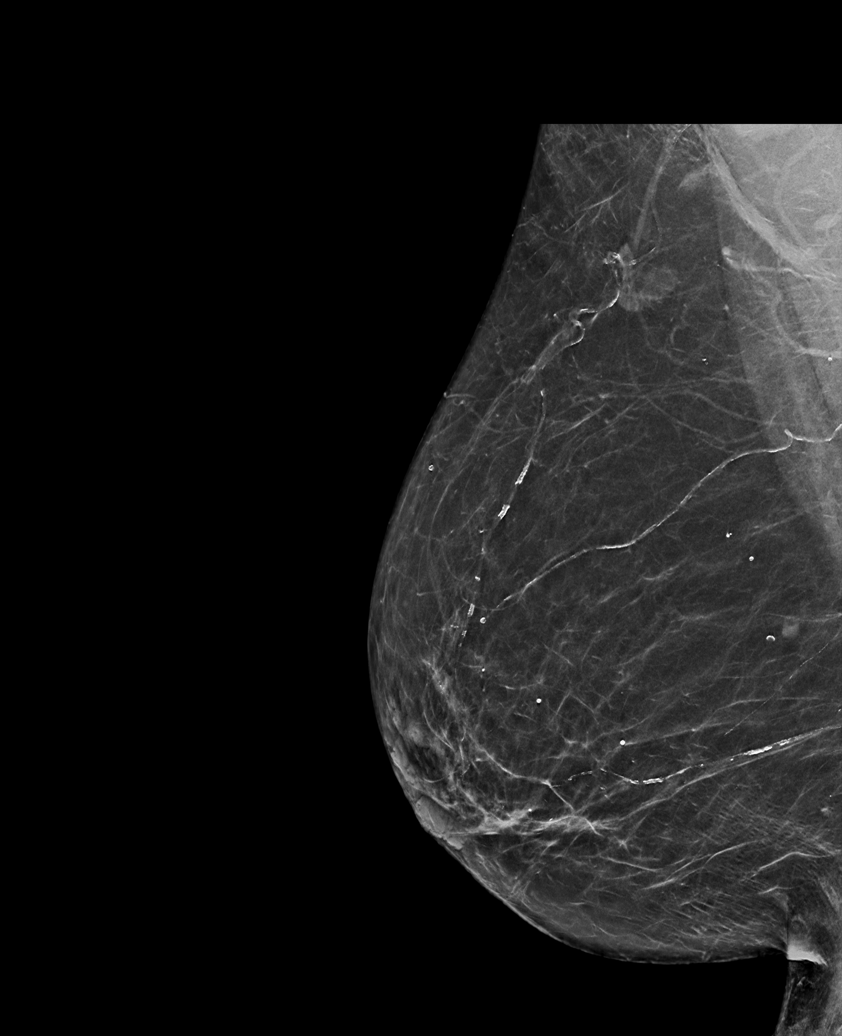

[L MLO synth-2D]
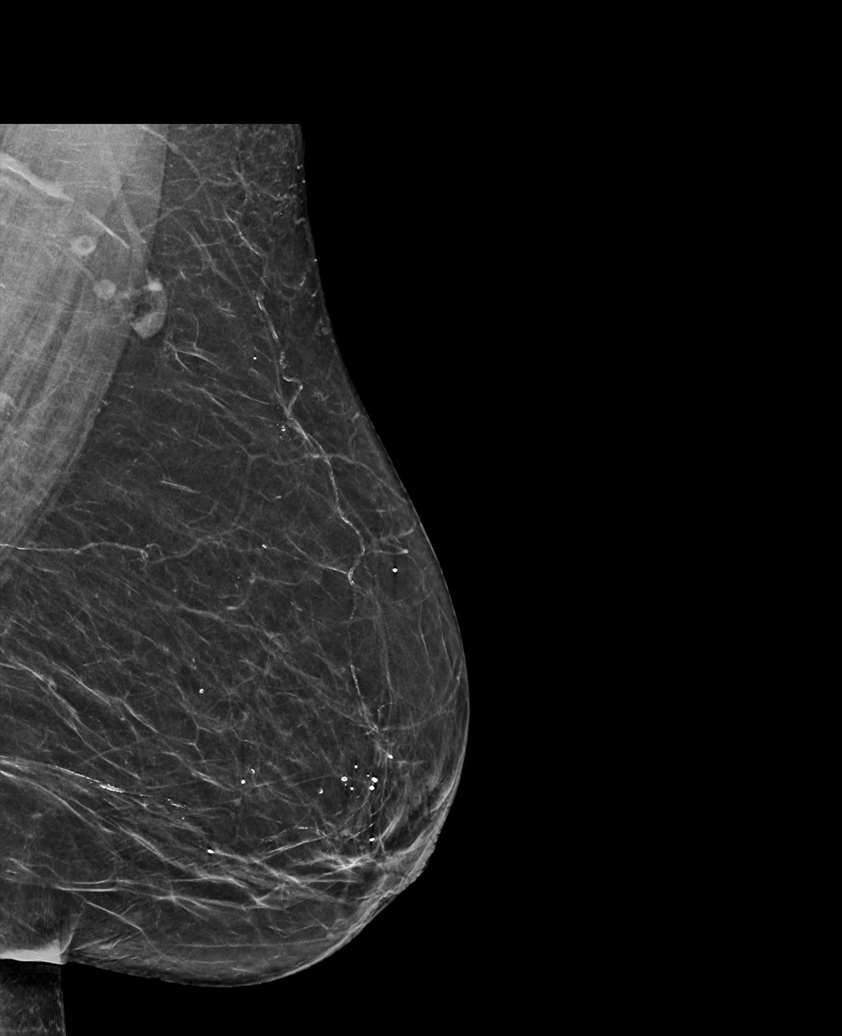

[R CC synth-2D (2 of 2)]
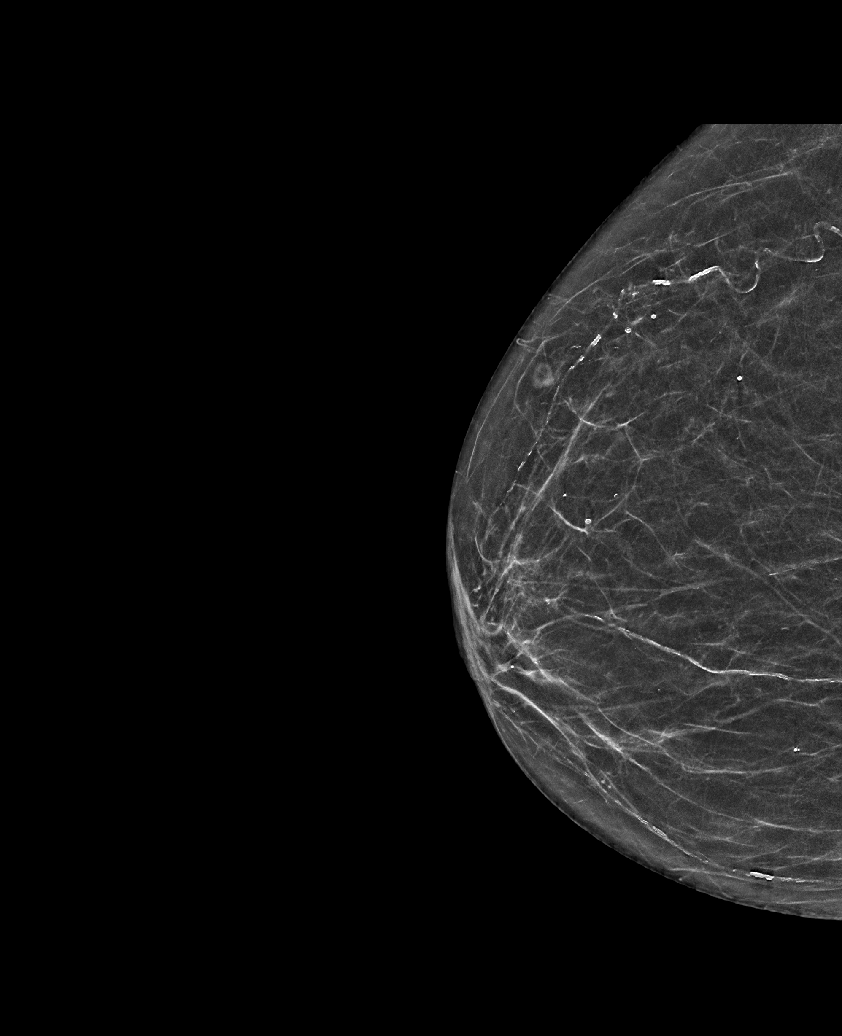

[R CC tomo · tomo slice 31/60.0]
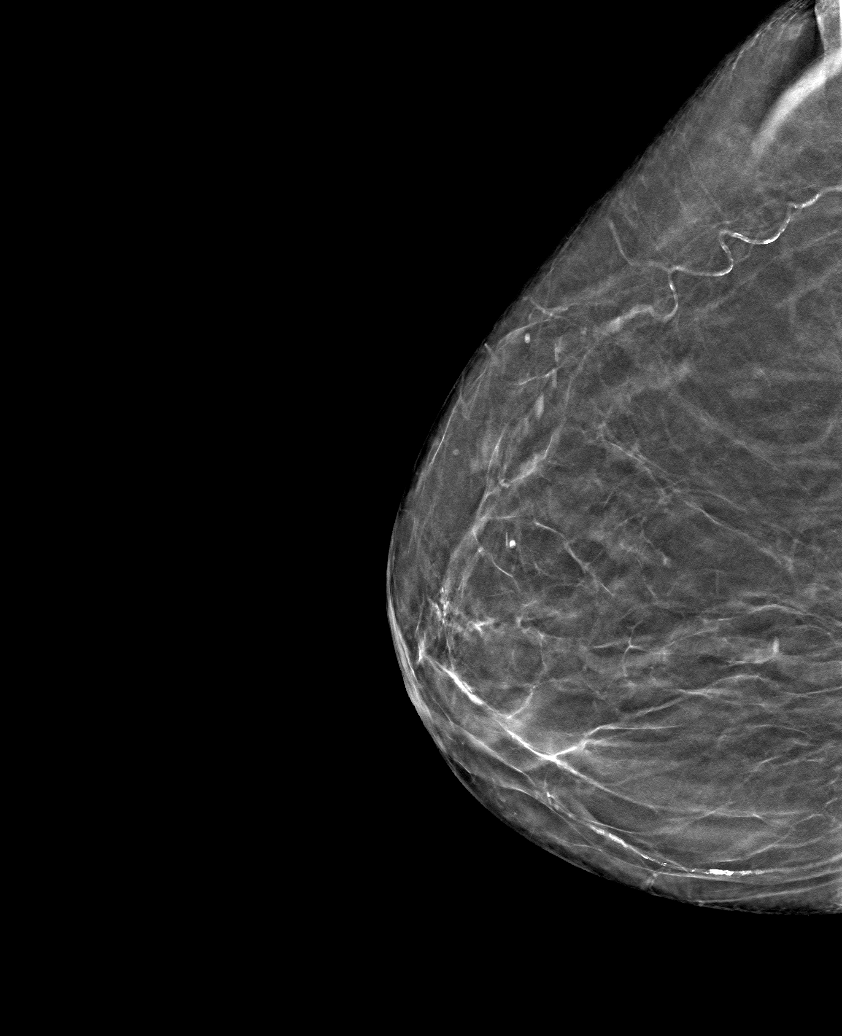

[6 of 30 positions shown; findings below may reference images not displayed]

ACR Breast Density Category b: There are scattered areas of
fibroglandular density.
FINDINGS: There are no findings suspicious for malignancy.
IMPRESSION: No mammographic evidence of malignancy. A result letter of this
screening mammogram will be mailed directly to the patient.

RECOMMENDATION:
Screening mammogram in one year. (Code:[BY])

BI-RADS CATEGORY  1: Negative.

## 2022-04-21 DIAGNOSIS — I10 Essential (primary) hypertension: Secondary | ICD-10-CM | POA: Diagnosis not present

## 2022-04-21 DIAGNOSIS — H9319 Tinnitus, unspecified ear: Secondary | ICD-10-CM | POA: Diagnosis not present

## 2022-04-21 DIAGNOSIS — E1169 Type 2 diabetes mellitus with other specified complication: Secondary | ICD-10-CM | POA: Diagnosis not present

## 2022-04-21 DIAGNOSIS — E785 Hyperlipidemia, unspecified: Secondary | ICD-10-CM | POA: Diagnosis not present

## 2022-04-25 NOTE — ED Provider Notes (Signed)
[facility] Provider Note   CSN: [id number] Arrival date & time: 02/13/22  1514     History  Chief Complaint  Patient presents with   Dizziness    Mary Dalton is a 66 y.o. female.  Patient presenting for sudden onset dizziness which started around noon while at work. Notes associated headache, vision change, neck pain. Reports her BP was check and found to be elevated. Did take her BP meds PTA; no recent changes to this medication. Denies chest pain, fever, syncope, vomiting, extremity numbness or weakness.  The history is provided by the patient. No language interpreter was used.  Dizziness Severity:  Moderate Onset quality:  Sudden Duration:  4 hours Timing:  Constant Progression:  Improving Chronicity:  New Relieved by:  Medication (took BP meds PTA) Associated symptoms: headaches, shortness of breath and vision changes   Associated symptoms: no chest pain, no syncope and no vomiting   Risk factors: heart disease and hx of stroke   Risk factors: no new medications       Home Medications Prior to Admission medications   Medication Sig Start Date End Date Taking? Authorizing Provider  amoxicillin (AMOXIL) 500 MG tablet Take 4 tablets one hour prior to dental procedure. 02/02/20   Magnant, Charles L, PA-C  cetirizine (ZYRTEC) 10 MG tablet Take 10 mg by mouth daily as needed for allergies.    [provider]  colchicine 0.6 MG tablet Take by mouth. 07/05/20   [provider]  hydrochlorothiazide (HYDRODIURIL) 12.5 MG tablet Take 12.5 mg by mouth daily. 06/08/20   [provider]  levothyroxine (SYNTHROID, LEVOTHROID) 112 MCG tablet Take 112 mcg by mouth daily before breakfast.    [provider]  metoprolol tartrate (LOPRESSOR) 25 MG tablet Take 1 tablet (25 mg total) by mouth 2 (two) times daily. 10/26/15   Thurnell Lose, MD  Multiple Vitamins-Minerals (MULTIVITAMIN WITH MINERALS) tablet Take 1  tablet by mouth every other day.    [provider]  Vitamin D, Ergocalciferol, (DRISDOL) 1.25 MG (50000 UNIT) CAPS capsule Take 1 capsule (50,000 Units total) by mouth every 7 (seven) days. 03/16/22   Criselda Peaches, DPM      Allergies    No known allergies    Review of Systems   Review of Systems  Respiratory:  Positive for shortness of breath.   Cardiovascular:  Negative for chest pain and syncope.  Gastrointestinal:  Negative for vomiting.  Neurological:  Positive for dizziness and headaches.  Ten systems reviewed and are negative for acute change, except as noted in the HPI.    Physical Exam Updated Vital Signs BP (!) 150/79 (BP Location: Right Arm)   Pulse 64   Temp 98.6 F (37 C) (Oral)   Resp 12   LMP  (LMP Unknown)   SpO2 100%   Physical Exam Vitals and nursing note reviewed.  Constitutional:      General: She is not in acute distress.    Appearance: She is well-developed. She is not diaphoretic.     Comments: Alert, pleasant, and in NAD  HENT:     Head: Normocephalic and atraumatic.     Right Ear: External ear normal.     Left Ear: External ear normal.     Mouth/Throat:     Mouth: Mucous membranes are moist.     Comments: Symmetric rise of the uvula with phonation Eyes:     General: No scleral icterus.    Extraocular  Movements: Extraocular movements intact.     Conjunctiva/sclera: Conjunctivae normal.     Pupils: Pupils are equal, round, and reactive to light.     Comments: No nystagmus noted  Neck:     Comments: No meningismus Pulmonary:     Effort: Pulmonary effort is normal. No respiratory distress.     Comments: Respirations even and unlabored Musculoskeletal:        General: Normal range of motion.     Cervical back: Normal range of motion.  Skin:    General: Skin is warm and dry.     Coloration: Skin is not pale.     Findings: No erythema or rash.  Neurological:     Mental Status: She is alert and oriented to person, place, and time.      Coordination: Coordination normal.     Comments: GCS 15. Speech is goal oriented. No cranial nerve deficits appreciated; symmetric eyebrow raise, no facial drooping, tongue midline. Patient has equal grip strength bilaterally with 5/5 strength against resistance in all major muscle groups bilaterally. Sensation to light touch intact. Patient moves extremities without ataxia. No pronator drift noted. Gait not assessed.  Psychiatric:        Behavior: Behavior normal.    ED Results / Procedures / Treatments   Labs (all labs ordered are listed, but only abnormal results are displayed) Labs Reviewed  COMPREHENSIVE METABOLIC PANEL - Abnormal; Notable for the following components:      Result Value   Glucose, Bld 102 (*)    All other components within normal limits  URINALYSIS, ROUTINE W REFLEX MICROSCOPIC - Abnormal; Notable for the following components:   Hgb urine dipstick SMALL (*)    Ketones, ur 5 (*)    Bacteria, UA RARE (*)    All other components within normal limits  I-STAT CHEM 8, ED - Abnormal; Notable for the following components:   Glucose, Bld 101 (*)    Calcium, Ion 1.12 (*)    All other components within normal limits  RESP PANEL BY RT-PCR (FLU A&B, COVID) ARPGX2  CBC WITH DIFFERENTIAL/PLATELET  PROTIME-INR  APTT  RAPID URINE DRUG SCREEN, HOSP PERFORMED  TROPONIN I (HIGH SENSITIVITY)  TROPONIN I (HIGH SENSITIVITY)    EKG EKG Interpretation  Date/Time:  Monday February 13 2022 16:23:16 EDT Ventricular Rate:  62 PR Interval:  169 QRS Duration: 92 QT Interval:  420 QTC Calculation: 427 R Axis:   3 Text Interpretation: Sinus rhythm Confirmed by Dene Gentry [id number]) on 02/13/2022 6:19:18 PM  Radiology No results found.  Procedures Procedures    Medications Ordered in ED Medications  metoCLOPramide (REGLAN) injection 10 mg (10 mg Intravenous Given 02/13/22 1915)  sodium chloride 0.9 % bolus 500 mL (0 mLs Intravenous Stopped 02/13/22 2218)  iohexol  (OMNIPAQUE) 350 MG/ML injection 75 mL (75 mLs Intravenous Contrast Given 02/13/22 2234)    ED Course/ Medical Decision Making/ A&P Clinical Course as of 04/25/22 1520  Mon Feb 13, 2022  1630 Patient seen by Dr. Leonel Ramsay on arrival as a CODE STROKE called in triage. [KH]  2000 Dr. Lorrin Goodell reviewing MRI imaging. Pending recommendations. [KH]  2007 Per Dr. Lorrin Goodell, MRI imaging is of poor quality and patient would benefit from completion of CTA head and neck to rule out possible dissection. [ZG]  0174 CTA negative for dissection.  Patient reassessed.  She is asymptomatic.  Blood pressure improving over ED course.  Agreeable to follow-up with her PCP. [KH]    Clinical Course User Index [KH]  Antonietta Breach, PA-C                           Medical Decision Making Amount and/or Complexity of Data Reviewed Radiology: ordered.  Risk Prescription drug management.   This patient presents to the ED for concern of dizziness, this involves an extensive number of treatment options, and is a complaint that carries with it a high risk of complications and morbidity.  The differential diagnosis includes BPPV vs labyrinthitis vs CVA vs hypertensive urgency/emergency vs orthostatic hypotension vs CO exposure vs complex migraine   Co morbidities that complicate the patient evaluation  HTN CAD CVA Hypothyroid    Lab Tests:  I Ordered, and personally interpreted labs.  The pertinent results include:  normal CBC, CMP, UA, UDS. Normal troponin. Negative for influenza and COVID.   Imaging Studies ordered:  I ordered imaging studies including CT head noncontrast, MRI brain, MRA head/neck, CTA head/neck  I independently visualized and interpreted imaging. MRA with nonvisualization of left V1-V3 segments with some signal seen in distal V4; unable to exclude occlusion. Subsequent CTA normal ruling out dissection and occlusion. No acute abnormalities noted.  I agree with the radiologist  interpretation   Cardiac Monitoring:  The patient was maintained on a cardiac monitor.  I personally viewed and interpreted the cardiac monitored which showed an underlying rhythm of: NSR   Medicines ordered and prescription drug management:  I ordered medication including Reglan for c/o dizziness  Reevaluation of the patient after these medicines showed that the patient improved I have reviewed the patients home medicines and have made adjustments as needed   Consultations Obtained:  I requested consultation with the neurologist and discussed lab and imaging findings as well as pertinent plan - they recommend: outpatient PCP follow up   Reevaluation:  After the interventions noted above, I reevaluated the patient and found that they have :improved   Social Determinants of Health:  Insured Good social support; family at bedside   Dispostion:  After consideration of the diagnostic results and the patients response to treatment, I feel that the patent would benefit from close PCP follow up for further evaluation of dizziness complaints. Encourage recheck of BP which was elevated on arrival; now spontaneously improving. Work up today has ruled out emergent causes of symptoms and was, specifically, negative for embolic CVA, ICH, and dissection.  Vitals:   02/13/22 2200 02/13/22 2215 02/13/22 2332 02/13/22 2345  BP: (!) 168/110  (!) 150/79   Pulse: 67 71 62 64  Resp: '16 17 15 12  '$ Temp:      TempSrc:      SpO2: 100% 99% 95% 100%          Final Clinical Impression(s) / ED Diagnoses Final diagnoses:  Dizziness    Rx / DC Orders ED Discharge Orders     None         Antonietta Breach, PA-C 04/25/22 1537    Valarie Merino, MD 04/26/22 1556

## 2022-05-11 ENCOUNTER — Other Ambulatory Visit: Payer: Self-pay | Admitting: Podiatry

## 2022-06-13 ENCOUNTER — Telehealth: Payer: Self-pay | Admitting: Urology

## 2022-06-13 NOTE — Telephone Encounter (Signed)
DOS - 07/07/22  ARTHRODESIS LEFT --- 33435 HALLUX MPJ FUSION LEFT --- 68616 BONE GRAFT LEFT --- 20900  [employer] EFFECTIVE DATE - 12/04/21  PLAN DEDUCTIBLE - $0.00 OUT OF POCKET - $3,400.00 W/ $3,090.00 REMAINING COINSURANCE - 0% COPAY - $295.00   PER [facility] WEBSITE FOR CPT CODES 83729 AND 02111 NO PRIOR AUTH IS REQUIRED. FOR CPT CODE 55208 HAS BEEN APPROVED, AUTH #[id number], GOOD FROM 07/07/22 - 10/05/22.  TRACKING # [id number]

## 2022-07-07 ENCOUNTER — Other Ambulatory Visit: Payer: Self-pay | Admitting: Podiatry

## 2022-07-07 DIAGNOSIS — G8918 Other acute postprocedural pain: Secondary | ICD-10-CM | POA: Diagnosis not present

## 2022-07-07 DIAGNOSIS — M2012 Hallux valgus (acquired), left foot: Secondary | ICD-10-CM | POA: Diagnosis not present

## 2022-07-07 DIAGNOSIS — M21612 Bunion of left foot: Secondary | ICD-10-CM | POA: Diagnosis not present

## 2022-07-07 DIAGNOSIS — M19072 Primary osteoarthritis, left ankle and foot: Secondary | ICD-10-CM | POA: Diagnosis not present

## 2022-07-07 MED ORDER — GABAPENTIN 300 MG PO CAPS: 300.0000 mg | ORAL_CAPSULE | Freq: Three times a day (TID) | ORAL | 0 refills | Status: DC

## 2022-07-07 MED ORDER — OXYCODONE HCL 5 MG PO TABS: 5.0000 mg | ORAL_TABLET | ORAL | 0 refills | Status: AC | PRN

## 2022-07-07 MED ORDER — RIVAROXABAN 10 MG PO TABS: 10.0000 mg | ORAL_TABLET | Freq: Every day | ORAL | 0 refills | Status: DC

## 2022-07-07 MED ORDER — ACETAMINOPHEN 500 MG PO TABS: 1000.0000 mg | ORAL_TABLET | Freq: Four times a day (QID) | ORAL | 0 refills | Status: AC | PRN

## 2022-07-07 NOTE — Progress Notes (Signed)
07/07/22 left foot joint fusions

## 2022-07-13 ENCOUNTER — Ambulatory Visit (INDEPENDENT_AMBULATORY_CARE_PROVIDER_SITE_OTHER): Payer: Medicare HMO

## 2022-07-13 ENCOUNTER — Ambulatory Visit (INDEPENDENT_AMBULATORY_CARE_PROVIDER_SITE_OTHER): Payer: Medicare HMO | Admitting: Podiatry

## 2022-07-13 DIAGNOSIS — M2042 Other hammer toe(s) (acquired), left foot: Secondary | ICD-10-CM

## 2022-07-13 DIAGNOSIS — M19072 Primary osteoarthritis, left ankle and foot: Secondary | ICD-10-CM | POA: Diagnosis not present

## 2022-07-13 DIAGNOSIS — M19079 Primary osteoarthritis, unspecified ankle and foot: Secondary | ICD-10-CM

## 2022-07-17 ENCOUNTER — Telehealth: Payer: Self-pay | Admitting: *Deleted

## 2022-07-17 NOTE — Telephone Encounter (Signed)
Patient is calling with concerns about her post procedural foot, woke up this morning and the bottom is very tender, unable to put boot on. Encouraged to keep elevated, ice and stay off of it as much as possible.  Please advise.

## 2022-07-17 NOTE — Progress Notes (Signed)
  Subjective:  Patient ID: Mary Dalton, female    DOB: 06/20/1956,  MRN: [id number]  Chief Complaint  Patient presents with   Routine Post Op    POV #1 DOS 07/07/2022 BUNION CORRECTION W/GREAT TOE FUSION, FUSION OF JOINTS IN MIDFOOT, BONE GRAFT FROM HEEL    66 y.o. female returns for post-op check.  She is doing well swelling is improving  Review of Systems: Negative except as noted in the HPI. Denies N/V/F/Ch.   Objective:  There were no vitals filed for this visit. There is no height or weight on file to calculate BMI. Constitutional Well developed. Well nourished.  Vascular Foot warm and well perfused. Capillary refill normal to all digits.  Calf is soft and supple, no posterior calf or knee pain, negative Homans' sign  Neurologic Normal speech. Oriented to person, place, and time. Epicritic sensation to light touch grossly present bilaterally.  Dermatologic Skin healing well without signs of infection. Skin edges well coapted without signs of infection.  Orthopedic: Tenderness to palpation noted about the surgical site.   Multiple view plain film radiographs: Arthrodesis of second and third tarsometatarsal joints with stable fixation, plate and screw fixation for the first metatarsal joint, equivalent to preoperative films no acute abnormalities Assessment:   1. Osteoarthritis of first metatarsophalangeal joint   2. Arthritis of midtarsal joint of left foot    Plan:  Patient was evaluated and treated and all questions answered.  S/p foot surgery left -Progressing as expected post-operatively. -XR: Noted above -WB Status: NWB in CAM boot this was dispensed today -Sutures: Plan to remove in 2 weeks. -Medications: No refills required -Foot redressed.  Return in about 2 weeks (around 07/27/2022) for post op (no x-rays), suture removal.

## 2022-07-18 NOTE — Telephone Encounter (Signed)
Spoke w/ patient giving physician's  recommendations , verbalized understanding, said that it feels much better today.

## 2022-07-27 ENCOUNTER — Ambulatory Visit (INDEPENDENT_AMBULATORY_CARE_PROVIDER_SITE_OTHER): Payer: Medicare HMO | Admitting: Podiatry

## 2022-07-27 DIAGNOSIS — M19079 Primary osteoarthritis, unspecified ankle and foot: Secondary | ICD-10-CM

## 2022-07-27 DIAGNOSIS — M19072 Primary osteoarthritis, left ankle and foot: Secondary | ICD-10-CM

## 2022-07-30 NOTE — Progress Notes (Signed)
  Subjective:  Patient ID: Mary Dalton, female    DOB: Mar 03, 1956,  MRN: [id number]  Chief Complaint  Patient presents with   Routine Post Op    suture removal.POV #2 DOS 07/07/2022 BUNION CORRECTION W/GREAT TOE FUSION, FUSION OF JOINTS IN MIDFOOT, BONE GRAFT FROM HEEL    66 y.o. female returns for post-op check.  Overall doing okay.  She presents today ambulating in her cam boot  Review of Systems: Negative except as noted in the HPI. Denies N/V/F/Ch.   Objective:  There were no vitals filed for this visit. There is no height or weight on file to calculate BMI. Constitutional Well developed. Well nourished.  Vascular Foot warm and well perfused. Capillary refill normal to all digits.  Calf is soft and supple, no posterior calf or knee pain, negative Homans' sign  Neurologic Normal speech. Oriented to person, place, and time. Epicritic sensation to light touch grossly present bilaterally.  Dermatologic Skin healing well without signs of infection. Skin edges well coapted without signs of infection.  Orthopedic: Tenderness to palpation noted about the surgical site.  Significant edema   Multiple view plain film radiographs: Arthrodesis of second and third tarsometatarsal joints with stable fixation, plate and screw fixation for the first metatarsal joint, equivalent to preoperative films no acute abnormalities Assessment:   1. Osteoarthritis of first metatarsophalangeal joint   2. Arthritis of midtarsal joint of left foot     Plan:  Patient was evaluated and treated and all questions answered.  S/p foot surgery left -Sutures removed uneventfully today.  I reminded her that she is to be nonweightbearing still for the midfoot fusion and she needs to be using her knee scooter.  She says she will do this.  I will see her back in 3 weeks for new radiographs  Return in about 3 weeks (around 08/17/2022) for post op (new x-rays).

## 2022-08-17 ENCOUNTER — Ambulatory Visit (INDEPENDENT_AMBULATORY_CARE_PROVIDER_SITE_OTHER): Payer: Medicare HMO | Admitting: Podiatry

## 2022-08-17 ENCOUNTER — Ambulatory Visit (INDEPENDENT_AMBULATORY_CARE_PROVIDER_SITE_OTHER): Payer: Medicare HMO

## 2022-08-17 DIAGNOSIS — M19079 Primary osteoarthritis, unspecified ankle and foot: Secondary | ICD-10-CM

## 2022-08-17 DIAGNOSIS — M19072 Primary osteoarthritis, left ankle and foot: Secondary | ICD-10-CM

## 2022-08-20 NOTE — Progress Notes (Signed)
  Subjective:  Patient ID: Mary Dalton, female    DOB: [birth year],  MRN: [id number]  Chief Complaint  Patient presents with   Routine Post Op    POV #3 DOS 07/07/2022 BUNION CORRECTION W/GREAT TOE FUSION, FUSION OF JOINTS IN MIDFOOT, BONE GRAFT FROM HEEL    66 y.o. female returns for post-op check.  She says it has been painful and continues to swell.  She again is ambulating in her cam boot  Review of Systems: Negative except as noted in the HPI. Denies N/V/F/Ch.   Objective:  There were no vitals filed for this visit. There is no height or weight on file to calculate BMI. Constitutional Well developed. Well nourished.  Vascular Foot warm and well perfused. Capillary refill normal to all digits.  Calf is soft and supple, no posterior calf or knee pain, negative Homans' sign  Neurologic Normal speech. Oriented to person, place, and time. Epicritic sensation to light touch grossly present bilaterally.  Dermatologic Skin healing well without signs of infection. Skin edges well coapted without signs of infection.  Orthopedic: Tenderness to palpation noted about the surgical site.  Significant edema   Multiple view plain film radiographs: Good consolidation across first metatarsal phalangeal joint arthrodesis site, there is little to no fusion noted across the second TMT, some across the third, no complication of hardware or change in alignment Assessment:   1. Osteoarthritis of first metatarsophalangeal joint   2. Arthritis of midtarsal joint of left foot     Plan:  Patient was evaluated and treated and all questions answered.  S/p foot surgery left -I again reiterated the importance that she continue to rest.  She was not to be weightbearing until this point but she has done this against my advice.  Suspect this likely will lead to some sort of delayed union.  She will continue in the cam walker boot for another 4 weeks and we will take new radiographs at that point to  reevaluate her healing.  May be able to transition to surgical shoe at that point  Return in about 4 weeks (around 09/14/2022) for post op (new x-rays).

## 2022-09-14 ENCOUNTER — Ambulatory Visit (INDEPENDENT_AMBULATORY_CARE_PROVIDER_SITE_OTHER): Payer: Medicare HMO | Admitting: Podiatry

## 2022-09-14 ENCOUNTER — Ambulatory Visit (INDEPENDENT_AMBULATORY_CARE_PROVIDER_SITE_OTHER): Payer: Medicare HMO

## 2022-09-14 DIAGNOSIS — M19072 Primary osteoarthritis, left ankle and foot: Secondary | ICD-10-CM

## 2022-09-14 DIAGNOSIS — M19079 Primary osteoarthritis, unspecified ankle and foot: Secondary | ICD-10-CM

## 2022-09-17 NOTE — Progress Notes (Signed)
  Subjective:  Patient ID: Mary Dalton, female    DOB: 11/17/1956,  MRN: [id number]  Chief Complaint  Patient presents with   Routine Post Op     POV #4 DOS 07/07/2022 BUNION CORRECTION W/GREAT TOE FUSION, FUSION OF JOINTS IN MIDFOOT, BONE GRAFT FROM HEEL    66 y.o. female returns for post-op check.  She says it is doing better pain is improving  Review of Systems: Negative except as noted in the HPI. Denies N/V/F/Ch.   Objective:  There were no vitals filed for this visit. There is no height or weight on file to calculate BMI. Constitutional Well developed. Well nourished.  Vascular Foot warm and well perfused. Capillary refill normal to all digits.  Calf is soft and supple, no posterior calf or knee pain, negative Homans' sign  Neurologic Normal speech. Oriented to person, place, and time. Epicritic sensation to light touch grossly present bilaterally.  Dermatologic Incisions well-healed nonhypertrophic  Orthopedic: She has no pain or edema today   Multiple view plain film radiographs: First MPJ fusion site appears to be well-healed, some early bridging across second and third TMT fusion sites Assessment:   1. Osteoarthritis of first metatarsophalangeal joint     Plan:  Patient was evaluated and treated and all questions answered.  S/p foot surgery left -Has had some improvement on her x-rays, I recommend she continue WBAT only in the surgical shoe which was dispensed today.  I will see her back in 1 month for new x-rays and then hopefully transition back to supportive shoe gear at that point  Return in about 1 month (around 10/15/2022) for surgery follow up (new x-rays).

## 2022-09-19 DIAGNOSIS — E039 Hypothyroidism, unspecified: Secondary | ICD-10-CM | POA: Diagnosis not present

## 2022-09-19 DIAGNOSIS — D649 Anemia, unspecified: Secondary | ICD-10-CM | POA: Diagnosis not present

## 2022-09-19 DIAGNOSIS — E1169 Type 2 diabetes mellitus with other specified complication: Secondary | ICD-10-CM | POA: Diagnosis not present

## 2022-09-19 DIAGNOSIS — E785 Hyperlipidemia, unspecified: Secondary | ICD-10-CM | POA: Diagnosis not present

## 2022-09-19 DIAGNOSIS — M109 Gout, unspecified: Secondary | ICD-10-CM | POA: Diagnosis not present

## 2022-09-19 DIAGNOSIS — I10 Essential (primary) hypertension: Secondary | ICD-10-CM | POA: Diagnosis not present

## 2022-09-19 DIAGNOSIS — R7989 Other specified abnormal findings of blood chemistry: Secondary | ICD-10-CM | POA: Diagnosis not present

## 2022-09-26 DIAGNOSIS — Z0000 Encounter for general adult medical examination without abnormal findings: Secondary | ICD-10-CM | POA: Diagnosis not present

## 2022-09-26 DIAGNOSIS — R82998 Other abnormal findings in urine: Secondary | ICD-10-CM | POA: Diagnosis not present

## 2022-09-26 DIAGNOSIS — E785 Hyperlipidemia, unspecified: Secondary | ICD-10-CM | POA: Diagnosis not present

## 2022-09-26 DIAGNOSIS — M79672 Pain in left foot: Secondary | ICD-10-CM | POA: Diagnosis not present

## 2022-09-26 DIAGNOSIS — E039 Hypothyroidism, unspecified: Secondary | ICD-10-CM | POA: Diagnosis not present

## 2022-09-26 DIAGNOSIS — G450 Vertebro-basilar artery syndrome: Secondary | ICD-10-CM | POA: Diagnosis not present

## 2022-09-26 DIAGNOSIS — I2510 Atherosclerotic heart disease of native coronary artery without angina pectoris: Secondary | ICD-10-CM | POA: Diagnosis not present

## 2022-09-26 DIAGNOSIS — E1169 Type 2 diabetes mellitus with other specified complication: Secondary | ICD-10-CM | POA: Diagnosis not present

## 2022-09-26 DIAGNOSIS — I10 Essential (primary) hypertension: Secondary | ICD-10-CM | POA: Diagnosis not present

## 2022-09-26 DIAGNOSIS — M109 Gout, unspecified: Secondary | ICD-10-CM | POA: Diagnosis not present

## 2022-10-19 ENCOUNTER — Ambulatory Visit: Payer: Medicare HMO | Admitting: Podiatry

## 2022-10-24 ENCOUNTER — Ambulatory Visit (INDEPENDENT_AMBULATORY_CARE_PROVIDER_SITE_OTHER): Payer: Medicare HMO | Admitting: Podiatry

## 2022-10-24 ENCOUNTER — Ambulatory Visit (INDEPENDENT_AMBULATORY_CARE_PROVIDER_SITE_OTHER): Payer: Medicare HMO

## 2022-10-24 DIAGNOSIS — M19072 Primary osteoarthritis, left ankle and foot: Secondary | ICD-10-CM | POA: Diagnosis not present

## 2022-10-24 DIAGNOSIS — M19079 Primary osteoarthritis, unspecified ankle and foot: Secondary | ICD-10-CM

## 2022-10-24 NOTE — Progress Notes (Signed)
  Subjective:  Patient ID: Mary Dalton, female    DOB: [birth year],  MRN: [id number]  Chief Complaint  Patient presents with   Bunions    POV #5 DOS 07/07/2022 BUNION CORRECTION W/GREAT TOE FUSION, FUSION OF JOINTS IN MIDFOOT, BONE GRAFT FROM HEEL    66 y.o. female returns for post-op check.  She is doing well she is having no pain  Review of Systems: Negative except as noted in the HPI. Denies N/V/F/Ch.   Objective:  There were no vitals filed for this visit. There is no height or weight on file to calculate BMI. Constitutional Well developed. Well nourished.  Vascular Foot warm and well perfused. Capillary refill normal to all digits.  Calf is soft and supple, no posterior calf or knee pain, negative Homans' sign  Neurologic Normal speech. Oriented to person, place, and time. Epicritic sensation to light touch grossly present bilaterally.  Dermatologic Incisions well-healed nonhypertrophic  Orthopedic: She has no pain or edema today   Multiple view plain film radiographs: First MPJ fusion site appears to be well-healed, good bridging across third TMT fusion site, still some delayed healing across the second but no complication of hardware Assessment:   1. Osteoarthritis of first metatarsophalangeal joint   2. Arthritis of midtarsal joint of left foot     Plan:  Patient was evaluated and treated and all questions answered.  S/p foot surgery left -Overall doing well and is completely asymptomatic.  She does have some delayed bone healing on her radiographs but with her lack of symptoms I think she can safely return to a good supportive shoe which we discussed.  I will see her back in January for new x-rays on both feet and we will plan for her right foot surgery in May of next year  Return in about 2 months (around 12/24/2022) for post op (new x-rays of both feet), surgery planning visit for R foot for May .

## 2022-12-26 ENCOUNTER — Ambulatory Visit (INDEPENDENT_AMBULATORY_CARE_PROVIDER_SITE_OTHER): Payer: Medicare HMO | Admitting: Podiatry

## 2022-12-26 ENCOUNTER — Encounter: Payer: Self-pay | Admitting: Podiatry

## 2022-12-26 ENCOUNTER — Ambulatory Visit (INDEPENDENT_AMBULATORY_CARE_PROVIDER_SITE_OTHER): Payer: Medicare HMO

## 2022-12-26 DIAGNOSIS — M2042 Other hammer toe(s) (acquired), left foot: Secondary | ICD-10-CM

## 2022-12-26 DIAGNOSIS — M19079 Primary osteoarthritis, unspecified ankle and foot: Secondary | ICD-10-CM

## 2022-12-26 DIAGNOSIS — M2041 Other hammer toe(s) (acquired), right foot: Secondary | ICD-10-CM

## 2022-12-26 DIAGNOSIS — M19072 Primary osteoarthritis, left ankle and foot: Secondary | ICD-10-CM | POA: Diagnosis not present

## 2022-12-26 DIAGNOSIS — E559 Vitamin D deficiency, unspecified: Secondary | ICD-10-CM

## 2022-12-26 DIAGNOSIS — Q66221 Congenital metatarsus adductus, right foot: Secondary | ICD-10-CM

## 2022-12-26 NOTE — Progress Notes (Addendum)
  Subjective:  Patient ID: Mary Dalton, female    DOB: Oct 18, 1956,  MRN: [id number]  Chief Complaint  Patient presents with   Mary Dalton Toe    2 months (around 12/24/2022) for post op (new x-rays of both feet), surgery planning visit for R foot for May    67 y.o. female returns for post-op check.  She is doing well she is having no pain in the left foot very pleased  Review of Systems: Negative except as noted in the HPI. Denies N/V/F/Ch.   Objective:  There were no vitals filed for this visit. There is no height or weight on file to calculate BMI. Constitutional Well developed. Well nourished.  Vascular Foot warm and well perfused. Capillary refill normal to all digits.  Calf is soft and supple, no posterior calf or knee pain, negative Homans' sign  Neurologic Normal speech. Oriented to person, place, and time. Epicritic sensation to light touch grossly present bilaterally.  Dermatologic Incisions well-healed nonhypertrophic  Orthopedic: She has no pain or edema today.  On the right foot she has a hallux valgus deformity with pain over the bunion and crepitance in the joint here and in the midfoot   Multiple view plain film radiographs: First MPJ fusion site appears to be well-healed, good bridging across third TMT fusion site, still some delayed healing across the second but no complication of hardware  New radiographs of the right foot today show maintained hallux valgus deformity with metatarsus adductus, degenerative changes of first MTPJ and second and third TMT J Assessment:   1. Hammertoe of left foot   2. Osteoarthritis of first metatarsophalangeal joint   3. Arthritis of midtarsal joint of left foot   4. Hammertoe of right foot   5. Hypovitaminosis D   6. Metatarsus adductus of right foot     Plan:  Patient was evaluated and treated and all questions answered.  S/p foot surgery left -Doing very well for left foot.  May continue regular shoe gear and activity.   We reviewed her radiographs and discussed the delayed healing of the second TMT fusion, hopefully continued consolidation will progress but she is asymptomatic here at this point   Regarding her right foot bunion deformity we discussed further treatment of this, she has done well with left foot surgical correction.  She would like to proceed with right foot surgical correction.  We again discussed the surgical procedures of 8 first metatarsal phalangeal joint arthrodesis, second and third tarsometatarsal arthrodesis with corrective osteotomies with internal fixation and bone graft from the heel.  We discussed the postoperative course and recovery process including the period of nonweightbearing.  We discussed the risk benefits and complications including but limited to pain, swelling, infection, scar, numbness which may be temporary or permanent, chronic pain, stiffness, nerve pain or damage, wound healing problems, bone healing problems including delayed or non-union.  She understands and wishes to proceed.  Surgery will be scheduled for May of this year.   Surgical plan:  Procedure: -Right foot first MPJ fusion, second and third tarsometatarsal fusion with osteotomy bone graft from heel  Location: -[facility]  Anesthesia plan: -IV sedation with regional block  Postoperative pain plan: - Tylenol 1000 mg every 6 hours,  gabapentin 300 mg every 8 hours x5 days, oxycodone 5 mg 1-2 tabs every 6 hours only as needed  DVT prophylaxis: -Xarelto 10 mg nightly  WB Restrictions / DME needs: -Nonweightbearing in splint postop  No follow-ups on file.

## 2023-01-09 DIAGNOSIS — E559 Vitamin D deficiency, unspecified: Secondary | ICD-10-CM | POA: Diagnosis not present

## 2023-01-10 LAB — VITAMIN D 25 HYDROXY (VIT D DEFICIENCY, FRACTURES): Vit D, 25-Hydroxy: 14.1 ng/mL — ABNORMAL LOW (ref 30.0–100.0)

## 2023-01-10 LAB — CALCIUM: Calcium: 9.2 mg/dL (ref 8.7–10.3)

## 2023-01-15 ENCOUNTER — Telehealth: Payer: Self-pay | Admitting: *Deleted

## 2023-01-15 NOTE — Telephone Encounter (Signed)
Patient is calling because she had seen on her portal that her calcium levels were low, will there be something prescribed? Please advise.

## 2023-01-16 MED ORDER — VITAMIN D (ERGOCALCIFEROL) 1.25 MG (50000 UNIT) PO CAPS: 50000.0000 [IU] | ORAL_CAPSULE | ORAL | 0 refills | Status: DC

## 2023-01-16 NOTE — Telephone Encounter (Signed)
Spoke with patient and she had completed the 50,000 units for 12 weeks, would like a refill.

## 2023-03-23 ENCOUNTER — Other Ambulatory Visit: Payer: Self-pay | Admitting: Internal Medicine

## 2023-03-23 DIAGNOSIS — Z1231 Encounter for screening mammogram for malignant neoplasm of breast: Secondary | ICD-10-CM

## 2023-03-28 DIAGNOSIS — E039 Hypothyroidism, unspecified: Secondary | ICD-10-CM | POA: Diagnosis not present

## 2023-03-28 DIAGNOSIS — M109 Gout, unspecified: Secondary | ICD-10-CM | POA: Diagnosis not present

## 2023-03-28 DIAGNOSIS — E1169 Type 2 diabetes mellitus with other specified complication: Secondary | ICD-10-CM | POA: Diagnosis not present

## 2023-03-28 DIAGNOSIS — I2510 Atherosclerotic heart disease of native coronary artery without angina pectoris: Secondary | ICD-10-CM | POA: Diagnosis not present

## 2023-03-28 DIAGNOSIS — E785 Hyperlipidemia, unspecified: Secondary | ICD-10-CM | POA: Diagnosis not present

## 2023-03-28 DIAGNOSIS — I10 Essential (primary) hypertension: Secondary | ICD-10-CM | POA: Diagnosis not present

## 2023-04-27 ENCOUNTER — Ambulatory Visit
Admission: RE | Admit: 2023-04-27 | Discharge: 2023-04-27 | Disposition: A | Discharge status: Home / Self Care | Payer: Medicare HMO | Source: Ambulatory Visit | Attending: Internal Medicine | Admitting: Internal Medicine

## 2023-04-27 DIAGNOSIS — Z1231 Encounter for screening mammogram for malignant neoplasm of breast: Secondary | ICD-10-CM | POA: Diagnosis not present

## 2023-05-03 ENCOUNTER — Encounter: Payer: Medicare HMO | Admitting: Podiatry

## 2023-05-17 ENCOUNTER — Encounter: Payer: Medicare HMO | Admitting: Podiatry

## 2023-06-05 ENCOUNTER — Encounter: Payer: Medicare HMO | Admitting: Podiatry

## 2023-07-26 ENCOUNTER — Other Ambulatory Visit: Payer: Self-pay | Admitting: Podiatry

## 2023-07-26 DIAGNOSIS — E559 Vitamin D deficiency, unspecified: Secondary | ICD-10-CM

## 2023-08-16 ENCOUNTER — Telehealth: Payer: Self-pay | Admitting: Podiatry

## 2023-08-16 NOTE — Telephone Encounter (Signed)
DOS- 09/14/2023  HALLUX MPJ FUSION WN-[id number] BONE GRAFT RT-20900 FUSION OF MIDFOOT-28735  [employer] EFFECTIVE DATE-12/04/2021  OOP- $ 3,600.00  WITH REMAINING $ 3,600.00  COINSURANCE- 0%  PER [employer] WEBSITE FOR CPT CODES 36644 AND [phone] NO PRIOR AUTH IS REQUIRED. PER [employer] WEBS[employer] PRIOR AUTH WAS APPROVED FOR CPT CODE 25956. GOOD FOR 09/14/2023  AUTH #[id number]  Tracking #[id number]

## 2023-08-29 DIAGNOSIS — E559 Vitamin D deficiency, unspecified: Secondary | ICD-10-CM | POA: Diagnosis not present

## 2023-08-30 LAB — CALCIUM: Calcium: 8.8 mg/dL (ref 8.7–10.3)

## 2023-08-30 LAB — VITAMIN D 25 HYDROXY (VIT D DEFICIENCY, FRACTURES): Vit D, 25-Hydroxy: 18.3 ng/mL — ABNORMAL LOW (ref 30.0–100.0)

## 2023-09-05 ENCOUNTER — Telehealth: Payer: Self-pay | Admitting: Podiatry

## 2023-09-05 NOTE — Telephone Encounter (Signed)
Patient called to cancel surgery, states that she will call back at the beginning of the year to reschedule. Reason: Lack of assistance and transportation post surgery.

## 2023-09-20 ENCOUNTER — Encounter: Payer: Medicare HMO | Admitting: Podiatry

## 2023-09-27 ENCOUNTER — Encounter: Payer: Medicare HMO | Admitting: Podiatry

## 2023-10-04 ENCOUNTER — Encounter: Payer: Medicare HMO | Admitting: Podiatry

## 2023-10-11 ENCOUNTER — Encounter: Payer: Medicare HMO | Admitting: Podiatry

## 2023-10-23 ENCOUNTER — Encounter: Payer: Medicare HMO | Admitting: Podiatry

## 2023-10-23 DIAGNOSIS — E785 Hyperlipidemia, unspecified: Secondary | ICD-10-CM | POA: Diagnosis not present

## 2023-10-23 DIAGNOSIS — D649 Anemia, unspecified: Secondary | ICD-10-CM | POA: Diagnosis not present

## 2023-10-24 DIAGNOSIS — I10 Essential (primary) hypertension: Secondary | ICD-10-CM | POA: Diagnosis not present

## 2023-10-24 DIAGNOSIS — I2510 Atherosclerotic heart disease of native coronary artery without angina pectoris: Secondary | ICD-10-CM | POA: Diagnosis not present

## 2023-10-24 DIAGNOSIS — E1169 Type 2 diabetes mellitus with other specified complication: Secondary | ICD-10-CM | POA: Diagnosis not present

## 2023-10-24 DIAGNOSIS — E039 Hypothyroidism, unspecified: Secondary | ICD-10-CM | POA: Diagnosis not present

## 2023-10-24 DIAGNOSIS — D649 Anemia, unspecified: Secondary | ICD-10-CM | POA: Diagnosis not present

## 2023-10-24 DIAGNOSIS — E785 Hyperlipidemia, unspecified: Secondary | ICD-10-CM | POA: Diagnosis not present

## 2023-10-30 ENCOUNTER — Encounter: Payer: Medicare HMO | Admitting: Podiatry

## 2023-10-30 DIAGNOSIS — I10 Essential (primary) hypertension: Secondary | ICD-10-CM | POA: Diagnosis not present

## 2023-10-30 DIAGNOSIS — K219 Gastro-esophageal reflux disease without esophagitis: Secondary | ICD-10-CM | POA: Diagnosis not present

## 2023-10-30 DIAGNOSIS — Z0000 Encounter for general adult medical examination without abnormal findings: Secondary | ICD-10-CM | POA: Diagnosis not present

## 2023-10-30 DIAGNOSIS — I2510 Atherosclerotic heart disease of native coronary artery without angina pectoris: Secondary | ICD-10-CM | POA: Diagnosis not present

## 2023-10-30 DIAGNOSIS — E785 Hyperlipidemia, unspecified: Secondary | ICD-10-CM | POA: Diagnosis not present

## 2023-10-30 DIAGNOSIS — Z1331 Encounter for screening for depression: Secondary | ICD-10-CM | POA: Diagnosis not present

## 2023-10-30 DIAGNOSIS — Z1339 Encounter for screening examination for other mental health and behavioral disorders: Secondary | ICD-10-CM | POA: Diagnosis not present

## 2023-10-30 DIAGNOSIS — E1169 Type 2 diabetes mellitus with other specified complication: Secondary | ICD-10-CM | POA: Diagnosis not present

## 2023-10-30 DIAGNOSIS — E039 Hypothyroidism, unspecified: Secondary | ICD-10-CM | POA: Diagnosis not present

## 2023-12-27 DIAGNOSIS — H1131 Conjunctival hemorrhage, right eye: Secondary | ICD-10-CM | POA: Diagnosis not present

## 2024-01-01 ENCOUNTER — Ambulatory Visit (INDEPENDENT_AMBULATORY_CARE_PROVIDER_SITE_OTHER): Payer: Medicare HMO | Admitting: Podiatry

## 2024-01-01 DIAGNOSIS — Z91199 Patient's noncompliance with other medical treatment and regimen due to unspecified reason: Secondary | ICD-10-CM

## 2024-01-02 NOTE — Progress Notes (Signed)
Same day cancellation

## 2024-01-03 ENCOUNTER — Ambulatory Visit (INDEPENDENT_AMBULATORY_CARE_PROVIDER_SITE_OTHER): Payer: Medicare HMO

## 2024-01-03 ENCOUNTER — Encounter: Payer: Self-pay | Admitting: Podiatry

## 2024-01-03 ENCOUNTER — Ambulatory Visit: Payer: Medicare HMO | Admitting: Podiatry

## 2024-01-03 VITALS — Ht 65.0 in

## 2024-01-03 DIAGNOSIS — M19079 Primary osteoarthritis, unspecified ankle and foot: Secondary | ICD-10-CM

## 2024-01-03 DIAGNOSIS — M79671 Pain in right foot: Secondary | ICD-10-CM

## 2024-01-03 DIAGNOSIS — E559 Vitamin D deficiency, unspecified: Secondary | ICD-10-CM | POA: Diagnosis not present

## 2024-01-03 DIAGNOSIS — Q66221 Congenital metatarsus adductus, right foot: Secondary | ICD-10-CM

## 2024-01-04 NOTE — Progress Notes (Signed)
  Subjective:  Patient ID: Mary Dalton, female    DOB: 1956/05/18,  MRN: [id number]  Chief Complaint  Patient presents with   surgery consult     She is here to talk about surgery for her right foot.     68 y.o. female returns for post-op check.  She returns for follow-up.  The left foot is doing well and remains pleased with this.  She is wearing regular shoes.  She is ready to schedule surgery on her right foot  Review of Systems: Negative except as noted in the HPI. Denies N/V/F/Ch.   Objective:  There were no vitals filed for this visit. Body mass index is 36.61 kg/m. Constitutional Well developed. Well nourished.  Vascular Foot warm and well perfused. Capillary refill normal to all digits.  Calf is soft and supple, no posterior calf or knee pain, negative Homans' sign  Neurologic Normal speech. Oriented to person, place, and time. Epicritic sensation to light touch grossly present bilaterally.  Dermatologic Incisions well-healed nonhypertrophic on left foot.  Orthopedic: She has no pain or edema today.  On the right foot she has a hallux valgus deformity with pain over the bunion and crepitance in the joint here and in the midfoot over the second and third tarsometatarsal joint no pain in the first TMT      New radiographs of the right foot today show maintained hallux valgus deformity with metatarsus adductus, degenerative changes of first MTPJ and second and third TMT J Assessment:   1. Right foot pain   2. Metatarsus adductus of right foot   3. Osteoarthritis of first metatarsophalangeal joint   4. Hypovitaminosis D     Plan:  Patient was evaluated and treated and all questions answered.      Regarding her right foot bunion deformity we discussed further treatment of this, she has done well with left foot surgical correction.  She would like to proceed with right foot surgical correction.  We again discussed the surgical procedures of first metatarsal  phalangeal joint arthrodesis, second and third tarsometatarsal arthrodesis with corrective osteotomies with internal fixation and bone graft from the heel.  We discussed the postoperative course and recovery process including the period of nonweightbearing.  We discussed the risk benefits and complications including but limited to pain, swelling, infection, scar, numbness which may be temporary or permanent, chronic pain, stiffness, nerve pain or damage, wound healing problems, bone healing problems including delayed or non-union.  She understands and wishes to proceed.  New vitamin D and calcium level ordered   Surgical plan:  Procedure: -Right foot first MPJ fusion, second and third tarsometatarsal fusion with osteotomy bone graft from heel  Location: -[facility]  Anesthesia plan: -IV sedation with regional block  Postoperative pain plan: - Tylenol 1000 mg every 6 hours,  gabapentin 300 mg every 8 hours x5 days, oxycodone 5 mg 1-2 tabs every 6 hours only as needed  DVT prophylaxis: -Xarelto 10 mg nightly  WB Restrictions / DME needs: -Nonweightbearing in splint postop  No follow-ups on file.

## 2024-01-06 ENCOUNTER — Other Ambulatory Visit: Payer: Self-pay | Admitting: Podiatry

## 2024-01-07 ENCOUNTER — Other Ambulatory Visit: Payer: Self-pay | Admitting: Podiatry

## 2024-01-07 DIAGNOSIS — E559 Vitamin D deficiency, unspecified: Secondary | ICD-10-CM

## 2024-01-09 DIAGNOSIS — E559 Vitamin D deficiency, unspecified: Secondary | ICD-10-CM | POA: Diagnosis not present

## 2024-01-10 ENCOUNTER — Encounter: Payer: Self-pay | Admitting: Podiatry

## 2024-01-10 LAB — VITAMIN D 25 HYDROXY (VIT D DEFICIENCY, FRACTURES): Vit D, 25-Hydroxy: 20.3 ng/mL — ABNORMAL LOW (ref 30.0–100.0)

## 2024-01-10 MED ORDER — VITAMIN D (ERGOCALCIFEROL) 1.25 MG (50000 UNIT) PO CAPS: 50000.0000 [IU] | ORAL_CAPSULE | ORAL | 0 refills | Status: DC

## 2024-01-16 ENCOUNTER — Telehealth: Payer: Self-pay | Admitting: Podiatry

## 2024-01-16 NOTE — Telephone Encounter (Signed)
DOS-02/15/24  HALUX MPJ FUSION [id number] FUSION MIDFOOT JOINTS [id number] BONE GRAFT [id number]  [employer] EFFECTIVE DATE- 12/04/21  PER THE [employer] PORTAL,PRIOR AUTH IS NOT REQUIRED FOR CPT CODES 01601 AND 20900. PER THE [place] PORTAL, PRIOR AUTH HAS BEEN APPROVED FOR CPT CODE 09323, GOOD FROM 02/15/24 - 05/17/24  Authorization #[id number]  Tracking #[id number]

## 2024-02-04 ENCOUNTER — Telehealth: Payer: Self-pay

## 2024-02-04 NOTE — Telephone Encounter (Signed)
 Mary Dalton called to reschedule her surgery with Dr. Lilian Kapur on 02/15/2024. She stated she had some things come up at work and needs to hold off. I have her rescheduled to 05/30/2024. Dr. Lilian Kapur and Aram Beecham at [facility] notified.

## 2024-02-11 NOTE — Telephone Encounter (Signed)
 Spoke with patient and she said likely June will not work because she is taking care of her father who is going into long-term care, she is thinking about October and will call to confirm a date

## 2024-02-21 ENCOUNTER — Encounter: Payer: Medicare HMO | Admitting: Podiatry

## 2024-03-06 ENCOUNTER — Encounter: Payer: Medicare HMO | Admitting: Podiatry

## 2024-03-10 DIAGNOSIS — L299 Pruritus, unspecified: Secondary | ICD-10-CM | POA: Diagnosis not present

## 2024-03-10 DIAGNOSIS — R21 Rash and other nonspecific skin eruption: Secondary | ICD-10-CM | POA: Diagnosis not present

## 2024-03-10 DIAGNOSIS — W57XXXA Bitten or stung by nonvenomous insect and other nonvenomous arthropods, initial encounter: Secondary | ICD-10-CM | POA: Diagnosis not present

## 2024-03-27 ENCOUNTER — Encounter: Payer: Medicare HMO | Admitting: Podiatry

## 2024-04-29 DIAGNOSIS — I10 Essential (primary) hypertension: Secondary | ICD-10-CM | POA: Diagnosis not present

## 2024-04-29 DIAGNOSIS — K219 Gastro-esophageal reflux disease without esophagitis: Secondary | ICD-10-CM | POA: Diagnosis not present

## 2024-04-29 DIAGNOSIS — E039 Hypothyroidism, unspecified: Secondary | ICD-10-CM | POA: Diagnosis not present

## 2024-04-29 DIAGNOSIS — G450 Vertebro-basilar artery syndrome: Secondary | ICD-10-CM | POA: Diagnosis not present

## 2024-04-29 DIAGNOSIS — E1169 Type 2 diabetes mellitus with other specified complication: Secondary | ICD-10-CM | POA: Diagnosis not present

## 2024-04-29 DIAGNOSIS — I2510 Atherosclerotic heart disease of native coronary artery without angina pectoris: Secondary | ICD-10-CM | POA: Diagnosis not present

## 2024-04-29 DIAGNOSIS — E785 Hyperlipidemia, unspecified: Secondary | ICD-10-CM | POA: Diagnosis not present

## 2024-04-29 DIAGNOSIS — H9319 Tinnitus, unspecified ear: Secondary | ICD-10-CM | POA: Diagnosis not present

## 2024-04-29 DIAGNOSIS — M109 Gout, unspecified: Secondary | ICD-10-CM | POA: Diagnosis not present

## 2024-06-05 ENCOUNTER — Encounter: Admitting: Podiatry

## 2024-06-19 ENCOUNTER — Encounter: Admitting: Podiatry

## 2024-07-02 ENCOUNTER — Other Ambulatory Visit: Payer: Self-pay | Admitting: Internal Medicine

## 2024-07-02 DIAGNOSIS — Z1231 Encounter for screening mammogram for malignant neoplasm of breast: Secondary | ICD-10-CM

## 2024-07-10 ENCOUNTER — Encounter: Admitting: Podiatry

## 2024-07-25 ENCOUNTER — Ambulatory Visit
Admission: RE | Admit: 2024-07-25 | Discharge: 2024-07-25 | Disposition: A | Discharge status: Home / Self Care | Source: Ambulatory Visit | Attending: Internal Medicine | Admitting: Internal Medicine

## 2024-07-25 DIAGNOSIS — Z1231 Encounter for screening mammogram for malignant neoplasm of breast: Secondary | ICD-10-CM

## 2024-10-21 ENCOUNTER — Telehealth: Payer: Self-pay | Admitting: Podiatry

## 2024-10-21 NOTE — Telephone Encounter (Signed)
Returned patient voicemail

## 2024-11-03 DIAGNOSIS — E785 Hyperlipidemia, unspecified: Secondary | ICD-10-CM | POA: Diagnosis not present

## 2024-11-03 DIAGNOSIS — D649 Anemia, unspecified: Secondary | ICD-10-CM | POA: Diagnosis not present

## 2024-11-10 DIAGNOSIS — I10 Essential (primary) hypertension: Secondary | ICD-10-CM | POA: Diagnosis not present

## 2024-11-10 DIAGNOSIS — E119 Type 2 diabetes mellitus without complications: Secondary | ICD-10-CM | POA: Diagnosis not present

## 2024-11-25 ENCOUNTER — Ambulatory Visit: Admitting: Podiatry

## 2024-12-01 ENCOUNTER — Ambulatory Visit: Admitting: Podiatry

## 2024-12-11 ENCOUNTER — Ambulatory Visit: Admitting: Podiatry

## 2024-12-11 VITALS — Ht 65.0 in | Wt 220.0 lb

## 2024-12-11 DIAGNOSIS — E559 Vitamin D deficiency, unspecified: Secondary | ICD-10-CM | POA: Diagnosis not present

## 2024-12-11 DIAGNOSIS — M19079 Primary osteoarthritis, unspecified ankle and foot: Secondary | ICD-10-CM

## 2024-12-11 DIAGNOSIS — Q66221 Congenital metatarsus adductus, right foot: Secondary | ICD-10-CM

## 2024-12-11 NOTE — Patient Instructions (Signed)

## 2024-12-12 ENCOUNTER — Other Ambulatory Visit: Payer: Self-pay | Admitting: Podiatry

## 2024-12-13 ENCOUNTER — Encounter: Payer: Self-pay | Admitting: Podiatry

## 2024-12-13 NOTE — Progress Notes (Signed)
"  °  Subjective:  Patient ID: Mary Dalton, female    DOB: 10-14-1956,  MRN: [id number]  Chief Complaint  Patient presents with   Foot Problem    RM 5 Patient is here to complete surgical sent forms    69 y.o. female returns for post-op check.  She returns for follow-up.  The left foot is doing well and remains pleased with this.  She is wearing regular shoes.  She is ready to schedule surgery on her right foot  Review of Systems: Negative except as noted in the HPI. Denies N/V/F/Ch.   Objective:  There were no vitals filed for this visit. Body mass index is 36.61 kg/m. Constitutional Well developed. Well nourished.  Vascular Foot warm and well perfused. Capillary refill normal to all digits.  Calf is soft and supple, no posterior calf or knee pain, negative Homans' sign  Neurologic Normal speech. Oriented to person, place, and time. Epicritic sensation to light touch grossly present bilaterally.  Dermatologic Incisions well-healed nonhypertrophic on left foot.  Orthopedic: She has no pain or edema today.  On the right foot she has a hallux valgus deformity with pain over the bunion and crepitance in the joint here and in the midfoot over the second and third tarsometatarsal joint no pain in the first TMT      New radiographs of the right foot today show maintained hallux valgus deformity with metatarsus adductus, degenerative changes of first MTPJ and second and third TMT J Assessment:   1. Metatarsus adductus of right foot   2. Osteoarthritis of first metatarsophalangeal joint     Plan:  Patient was evaluated and treated and all questions answered.      Regarding her right foot bunion deformity we discussed further treatment of this, she has done well with left foot surgical correction.  She would like to proceed with right foot surgical correction.  We again discussed the surgical procedures of first metatarsal phalangeal joint arthrodesis, second and third  tarsometatarsal arthrodesis with corrective osteotomies with internal fixation and bone graft from the heel.  We discussed the postoperative course and recovery process including the period of nonweightbearing.  We discussed the risk benefits and complications including but limited to pain, swelling, infection, scar, numbness which may be temporary or permanent, chronic pain, stiffness, nerve pain or damage, wound healing problems, bone healing problems including delayed or non-union.  She understands and wishes to proceed.   New vitamin D  level ordered   Surgical plan:  Procedure: -Right foot first MPJ fusion, second and third tarsometatarsal fusion with osteotomy bone graft from heel  Location: -[facility]  Anesthesia plan: -IV sedation with regional block  Postoperative pain plan: - Tylenol  1000 mg every 6 hours,  gabapentin  300 mg every 8 hours x5 days, oxycodone  5 mg 1-2 tabs every 6 hours only as needed  DVT prophylaxis: -Xarelto  10 mg nightly  WB Restrictions / DME needs: -Nonweightbearing in splint postop  No follow-ups on file.  "

## 2024-12-18 ENCOUNTER — Ambulatory Visit: Payer: Self-pay | Admitting: Podiatry

## 2024-12-18 DIAGNOSIS — E559 Vitamin D deficiency, unspecified: Secondary | ICD-10-CM

## 2024-12-18 LAB — VITAMIN D 25 HYDROXY (VIT D DEFICIENCY, FRACTURES): Vit D, 25-Hydroxy: 16.2 ng/mL — ABNORMAL LOW (ref 30.0–100.0)

## 2024-12-19 MED ORDER — VITAMIN D (ERGOCALCIFEROL) 1.25 MG (50000 UNIT) PO CAPS: 50000.0000 [IU] | ORAL_CAPSULE | ORAL | 0 refills | Status: AC

## 2025-05-18 ENCOUNTER — Ambulatory Visit: Admitting: "Endocrinology
